# Patient Record
Sex: Male | Born: 1940 | Race: Black or African American | Hispanic: No | Marital: Married | Smoking: Former smoker
Health system: Southern US, Community
[De-identification: ages and names within clinical notes are randomized; demographics above are authoritative.]

## PROBLEM LIST (undated history)

## (undated) DIAGNOSIS — E7800 Pure hypercholesterolemia, unspecified: Secondary | ICD-10-CM

## (undated) DIAGNOSIS — Z87891 Personal history of nicotine dependence: Secondary | ICD-10-CM

## (undated) DIAGNOSIS — E119 Type 2 diabetes mellitus without complications: Secondary | ICD-10-CM

## (undated) DIAGNOSIS — H9190 Unspecified hearing loss, unspecified ear: Secondary | ICD-10-CM

## (undated) DIAGNOSIS — H9193 Unspecified hearing loss, bilateral: Secondary | ICD-10-CM

## (undated) DIAGNOSIS — R569 Unspecified convulsions: Secondary | ICD-10-CM

## (undated) DIAGNOSIS — N189 Chronic kidney disease, unspecified: Secondary | ICD-10-CM

## (undated) HISTORY — DX: Unspecified convulsions: R56.9

## (undated) HISTORY — PX: SPINE SURGERY: SHX786

---

## 1998-07-25 ENCOUNTER — Other Ambulatory Visit: Admission: RE | Admit: 1998-07-25 | Discharge: 1998-07-25 | Payer: Self-pay | Admitting: *Deleted

## 2003-08-16 ENCOUNTER — Inpatient Hospital Stay (HOSPITAL_COMMUNITY): Admission: RE | Admit: 2003-08-16 | Discharge: 2003-08-17 | Payer: Self-pay | Admitting: Neurosurgery

## 2011-11-30 ENCOUNTER — Other Ambulatory Visit: Payer: Self-pay

## 2011-11-30 DIAGNOSIS — I70219 Atherosclerosis of native arteries of extremities with intermittent claudication, unspecified extremity: Secondary | ICD-10-CM

## 2011-12-21 ENCOUNTER — Encounter: Payer: Self-pay | Admitting: Surgery

## 2012-01-04 ENCOUNTER — Encounter (HOSPITAL_COMMUNITY): Payer: Self-pay | Admitting: Family Medicine

## 2012-01-04 ENCOUNTER — Inpatient Hospital Stay (HOSPITAL_COMMUNITY): Payer: Medicare Other

## 2012-01-04 ENCOUNTER — Inpatient Hospital Stay (HOSPITAL_COMMUNITY)
Admission: AD | Admit: 2012-01-04 | Discharge: 2012-01-21 | DRG: 682 | Disposition: A | Discharge status: Skilled Nursing Facility | Payer: Medicare Other | Source: Other Acute Inpatient Hospital | Attending: Internal Medicine | Admitting: Internal Medicine

## 2012-01-04 DIAGNOSIS — I2692 Saddle embolus of pulmonary artery without acute cor pulmonale: Secondary | ICD-10-CM | POA: Diagnosis present

## 2012-01-04 DIAGNOSIS — E43 Unspecified severe protein-calorie malnutrition: Secondary | ICD-10-CM | POA: Diagnosis present

## 2012-01-04 DIAGNOSIS — K7200 Acute and subacute hepatic failure without coma: Secondary | ICD-10-CM | POA: Diagnosis present

## 2012-01-04 DIAGNOSIS — Z9282 Status post administration of tPA (rtPA) in a different facility within the last 24 hours prior to admission to current facility: Secondary | ICD-10-CM

## 2012-01-04 DIAGNOSIS — K5900 Constipation, unspecified: Secondary | ICD-10-CM | POA: Diagnosis not present

## 2012-01-04 DIAGNOSIS — D649 Anemia, unspecified: Secondary | ICD-10-CM | POA: Diagnosis present

## 2012-01-04 DIAGNOSIS — Y9210 Unspecified residential institution as the place of occurrence of the external cause: Secondary | ICD-10-CM | POA: Diagnosis not present

## 2012-01-04 DIAGNOSIS — G931 Anoxic brain damage, not elsewhere classified: Secondary | ICD-10-CM | POA: Diagnosis present

## 2012-01-04 DIAGNOSIS — I469 Cardiac arrest, cause unspecified: Secondary | ICD-10-CM | POA: Diagnosis present

## 2012-01-04 DIAGNOSIS — I129 Hypertensive chronic kidney disease with stage 1 through stage 4 chronic kidney disease, or unspecified chronic kidney disease: Secondary | ICD-10-CM | POA: Diagnosis present

## 2012-01-04 DIAGNOSIS — G912 (Idiopathic) normal pressure hydrocephalus: Secondary | ICD-10-CM | POA: Clinically undetermined

## 2012-01-04 DIAGNOSIS — I2699 Other pulmonary embolism without acute cor pulmonale: Secondary | ICD-10-CM | POA: Diagnosis present

## 2012-01-04 DIAGNOSIS — E119 Type 2 diabetes mellitus without complications: Secondary | ICD-10-CM

## 2012-01-04 DIAGNOSIS — N179 Acute kidney failure, unspecified: Secondary | ICD-10-CM | POA: Diagnosis present

## 2012-01-04 DIAGNOSIS — R339 Retention of urine, unspecified: Secondary | ICD-10-CM

## 2012-01-04 DIAGNOSIS — I498 Other specified cardiac arrhythmias: Secondary | ICD-10-CM | POA: Diagnosis not present

## 2012-01-04 DIAGNOSIS — N170 Acute kidney failure with tubular necrosis: Principal | ICD-10-CM | POA: Diagnosis present

## 2012-01-04 DIAGNOSIS — E1169 Type 2 diabetes mellitus with other specified complication: Secondary | ICD-10-CM | POA: Diagnosis not present

## 2012-01-04 DIAGNOSIS — N183 Chronic kidney disease, stage 3 unspecified: Secondary | ICD-10-CM | POA: Diagnosis present

## 2012-01-04 DIAGNOSIS — T426X5A Adverse effect of other antiepileptic and sedative-hypnotic drugs, initial encounter: Secondary | ICD-10-CM | POA: Diagnosis not present

## 2012-01-04 DIAGNOSIS — E785 Hyperlipidemia, unspecified: Secondary | ICD-10-CM | POA: Diagnosis present

## 2012-01-04 DIAGNOSIS — I82409 Acute embolism and thrombosis of unspecified deep veins of unspecified lower extremity: Secondary | ICD-10-CM | POA: Diagnosis present

## 2012-01-04 DIAGNOSIS — G9340 Encephalopathy, unspecified: Secondary | ICD-10-CM | POA: Diagnosis present

## 2012-01-04 DIAGNOSIS — Z87891 Personal history of nicotine dependence: Secondary | ICD-10-CM

## 2012-01-04 DIAGNOSIS — I2782 Chronic pulmonary embolism: Secondary | ICD-10-CM

## 2012-01-04 DIAGNOSIS — H9190 Unspecified hearing loss, unspecified ear: Secondary | ICD-10-CM | POA: Diagnosis present

## 2012-01-04 DIAGNOSIS — E870 Hyperosmolality and hypernatremia: Secondary | ICD-10-CM | POA: Diagnosis not present

## 2012-01-04 DIAGNOSIS — I10 Essential (primary) hypertension: Secondary | ICD-10-CM

## 2012-01-04 DIAGNOSIS — J9600 Acute respiratory failure, unspecified whether with hypoxia or hypercapnia: Secondary | ICD-10-CM | POA: Diagnosis present

## 2012-01-04 HISTORY — DX: Personal history of nicotine dependence: Z87.891

## 2012-01-04 HISTORY — DX: Pure hypercholesterolemia, unspecified: E78.00

## 2012-01-04 HISTORY — DX: Type 2 diabetes mellitus without complications: E11.9

## 2012-01-04 HISTORY — DX: Unspecified hearing loss, bilateral: H91.93

## 2012-01-04 HISTORY — DX: Unspecified hearing loss, unspecified ear: H91.90

## 2012-01-04 HISTORY — DX: Chronic kidney disease, unspecified: N18.9

## 2012-01-04 LAB — CREATININE, URINE, RANDOM: Creatinine, Urine: 38.98 mg/dL

## 2012-01-04 LAB — URINALYSIS, ROUTINE W REFLEX MICROSCOPIC
Bilirubin Urine: NEGATIVE
Glucose, UA: 100 mg/dL — AB
Ketones, ur: NEGATIVE mg/dL
Nitrite: NEGATIVE
Protein, ur: 30 mg/dL — AB
Specific Gravity, Urine: 1.01 (ref 1.005–1.030)
Urobilinogen, UA: 1 mg/dL (ref 0.0–1.0)
pH: 7 (ref 5.0–8.0)

## 2012-01-04 LAB — COMPREHENSIVE METABOLIC PANEL
Alkaline Phosphatase: 135 U/L — ABNORMAL HIGH (ref 39–117)
BUN: 68 mg/dL — ABNORMAL HIGH (ref 6–23)
CO2: 19 mEq/L (ref 19–32)
Chloride: 104 mEq/L (ref 96–112)
Creatinine, Ser: 7.29 mg/dL — ABNORMAL HIGH (ref 0.50–1.35)
GFR calc Af Amer: 8 mL/min — ABNORMAL LOW (ref >90)
GFR calc non Af Amer: 7 mL/min — ABNORMAL LOW (ref >90)
Glucose, Bld: 126 mg/dL — ABNORMAL HIGH (ref 70–99)
Potassium: 4.3 mEq/L (ref 3.5–5.1)
Total Bilirubin: 1.1 mg/dL (ref 0.3–1.2)

## 2012-01-04 LAB — POCT I-STAT 3, ART BLOOD GAS (G3+)
Acid-base deficit: 5 mmol/L — ABNORMAL HIGH (ref 0.0–2.0)
O2 Saturation: 87 %
TCO2: 21 mmol/L (ref 0–100)
pCO2 arterial: 30.9 mmHg — ABNORMAL LOW (ref 35.0–45.0)

## 2012-01-04 LAB — PROTIME-INR
INR: 2.13 — ABNORMAL HIGH (ref 0.00–1.49)
Prothrombin Time: 22.9 seconds — ABNORMAL HIGH (ref 11.6–15.2)

## 2012-01-04 LAB — CBC
MCH: 26.5 pg (ref 26.0–34.0)
MCHC: 33 g/dL (ref 30.0–36.0)
RDW: 17.4 % — ABNORMAL HIGH (ref 11.5–15.5)

## 2012-01-04 LAB — MAGNESIUM: Magnesium: 2 mg/dL (ref 1.5–2.5)

## 2012-01-04 LAB — URINE MICROSCOPIC-ADD ON

## 2012-01-04 LAB — PHOSPHORUS: Phosphorus: 5.7 mg/dL — ABNORMAL HIGH (ref 2.3–4.6)

## 2012-01-04 LAB — MRSA PCR SCREENING: MRSA by PCR: NEGATIVE

## 2012-01-04 LAB — PROCALCITONIN: Procalcitonin: 4.8 ng/mL

## 2012-01-04 MED ORDER — PRISMASOL BGK 4/2.5 32-4-2.5 MEQ/L IV SOLN
INTRAVENOUS | Status: DC
  Administered 2012-01-04 – 2012-01-07 (×7): via INTRAVENOUS_CENTRAL
  Filled 2012-01-04 (×7): qty 5000

## 2012-01-04 MED ORDER — SODIUM CHLORIDE 0.9 % IV SOLN
250.0000 mL | INTRAVENOUS | Status: DC | PRN
  Administered 2012-01-06 – 2012-01-14 (×3): 250 mL via INTRAVENOUS

## 2012-01-04 MED ORDER — SODIUM CHLORIDE 0.9 % IJ SOLN: 3.0000 mL | Freq: Two times a day (BID) | INTRAMUSCULAR | Status: DC

## 2012-01-04 MED ORDER — PRISMASOL BGK 4/2.5 32-4-2.5 MEQ/L IV SOLN
INTRAVENOUS | Status: DC
  Administered 2012-01-04 – 2012-01-08 (×8): via INTRAVENOUS_CENTRAL
  Filled 2012-01-04 (×9): qty 5000

## 2012-01-04 MED ORDER — DEXTROSE 5 % IV SOLN: 2.0000 g | Freq: Once | INTRAVENOUS | Status: DC

## 2012-01-04 MED ORDER — SODIUM CHLORIDE 0.9 % IV SOLN
25.0000 ug/h | INTRAVENOUS | Status: DC
  Administered 2012-01-04: 50 ug/h via INTRAVENOUS
  Filled 2012-01-04: qty 50

## 2012-01-04 MED ORDER — FENTANYL BOLUS VIA INFUSION
25.0000 ug | Freq: Four times a day (QID) | INTRAVENOUS | Status: DC | PRN
  Filled 2012-01-04: qty 100

## 2012-01-04 MED ORDER — PRISMASOL BGK 4/2.5 32-4-2.5 MEQ/L IV SOLN
INTRAVENOUS | Status: DC
  Administered 2012-01-04 – 2012-01-08 (×22): via INTRAVENOUS_CENTRAL
  Filled 2012-01-04 (×32): qty 5000

## 2012-01-04 MED ORDER — CHLORHEXIDINE GLUCONATE 0.12 % MT SOLN
15.0000 mL | Freq: Two times a day (BID) | OROMUCOSAL | Status: DC
  Administered 2012-01-04 – 2012-01-21 (×28): 15 mL via OROMUCOSAL
  Filled 2012-01-04 (×37): qty 15

## 2012-01-04 MED ORDER — SODIUM CHLORIDE 0.9 % IV SOLN
250.0000 mL | INTRAVENOUS | Status: DC | PRN
  Administered 2012-01-07: 250 mL via INTRAVENOUS

## 2012-01-04 MED ORDER — HEPARIN SODIUM (PORCINE) 1000 UNIT/ML IJ SOLN
INTRAMUSCULAR | Status: AC
  Administered 2012-01-04: 2400 [IU]
  Filled 2012-01-04: qty 3

## 2012-01-04 MED ORDER — HEPARIN (PORCINE) 2000 UNITS/L FOR CRRT
INTRAVENOUS_CENTRAL | Status: DC | PRN
  Filled 2012-01-04: qty 1000

## 2012-01-04 MED ORDER — PROPOFOL 10 MG/ML IV EMUL
5.0000 ug/kg/min | INTRAVENOUS | Status: DC
  Administered 2012-01-04 – 2012-01-05 (×2): 30 ug/kg/min via INTRAVENOUS
  Administered 2012-01-05 – 2012-01-06 (×2): 45 ug/kg/min via INTRAVENOUS
  Filled 2012-01-04 (×6): qty 100

## 2012-01-04 MED ORDER — HEPARIN SODIUM (PORCINE) 1000 UNIT/ML DIALYSIS
1000.0000 [IU] | INTRAMUSCULAR | Status: DC | PRN
  Administered 2012-01-08: 2.4 [IU] via INTRAVENOUS_CENTRAL
  Filled 2012-01-04: qty 3
  Filled 2012-01-04: qty 6

## 2012-01-04 MED ORDER — OSMOLITE 1.2 CAL PO LIQD
1000.0000 mL | ORAL | Status: DC
  Administered 2012-01-05: 1000 mL
  Filled 2012-01-04 (×3): qty 1000

## 2012-01-04 MED ORDER — SODIUM CHLORIDE 0.9 % IJ SOLN: 3.0000 mL | INTRAMUSCULAR | Status: DC | PRN

## 2012-01-04 MED ORDER — INSULIN ASPART 100 UNIT/ML ~~LOC~~ SOLN
2.0000 [IU] | SUBCUTANEOUS | Status: DC
  Administered 2012-01-05: 2 [IU] via SUBCUTANEOUS
  Administered 2012-01-05 – 2012-01-06 (×2): 4 [IU] via SUBCUTANEOUS
  Administered 2012-01-06 (×4): 2 [IU] via SUBCUTANEOUS
  Administered 2012-01-07 (×2): 4 [IU] via SUBCUTANEOUS
  Administered 2012-01-07 – 2012-01-09 (×3): 2 [IU] via SUBCUTANEOUS
  Administered 2012-01-10 (×2): 4 [IU] via SUBCUTANEOUS
  Administered 2012-01-10 (×2): 2 [IU] via SUBCUTANEOUS
  Administered 2012-01-11: 4 [IU] via SUBCUTANEOUS
  Administered 2012-01-11: 2 [IU] via SUBCUTANEOUS
  Administered 2012-01-11: 6 [IU] via SUBCUTANEOUS
  Administered 2012-01-11: 2 [IU] via SUBCUTANEOUS

## 2012-01-04 MED ORDER — BIOTENE DRY MOUTH MT LIQD
15.0000 mL | Freq: Four times a day (QID) | OROMUCOSAL | Status: DC
  Administered 2012-01-04 – 2012-01-21 (×54): 15 mL via OROMUCOSAL

## 2012-01-04 MED ORDER — PANTOPRAZOLE SODIUM 40 MG IV SOLR
40.0000 mg | INTRAVENOUS | Status: DC
  Administered 2012-01-04 – 2012-01-05 (×2): 40 mg via INTRAVENOUS
  Filled 2012-01-04 (×4): qty 40

## 2012-01-04 NOTE — Progress Notes (Signed)
ANTICOAGULATION CONSULT NOTE - Initial Consult  Pharmacy Consult for heparin Indication: pulmonary embolus  Allergies  Allergen Reactions  . Metformin And Related     Patient Measurements: Height: 5\' 10"  (177.8 cm) Weight: 250 lb 14.1 oz (113.8 kg) IBW/kg (Calculated) : 73  Heparin Dosing Weight: 98kg Vital Signs: Temp: 97.4 F (36.3 C) (11/18 1500) Temp src: Oral (11/18 1500) BP: 143/94 mmHg (11/18 1845) Pulse Rate: 62  (11/18 1815)  Labs:  Basename 01/04/12 1830 01/04/12 1528  HGB -- 10.4*  HCT -- 31.5*  PLT -- 171  APTT -- --  LABPROT 22.9* --  INR 2.13* --  HEPARINUNFRC -- --  CREATININE -- 7.29*  CKTOTAL -- --  CKMB -- --  TROPONINI -- --    Estimated Creatinine Clearance: 11.9 ml/min (by C-G formula based on Cr of 7.29).   Medical History: Past Medical History  Diagnosis Date  . Diabetes mellitus, type 2   . Hypercholesterolemia   . Former smoker   . Bilateral hearing loss     Wears hearing aids    Assessment: 71 yo male here from [place] with PE. Patient noted s/p cardioac arrest on 11/13 and TPA 100mg  IV. Patient has been receiving lovenox and coumadin with INR=2.13. SCr=7.29 and patient to begin CRRT. Last dose of lovenox is not clear but Xa is 0.13 today (where a level of 1-2 Xa is recommended therapeutic dosing). Patient to begin heparin when INR < 2.0.  Goal of Therapy:  Heparin level 0.3-0.7 units/ml Monitor platelets by anticoagulation protocol: Yes   Plan:  -Hold heparin for now -Daily PT/INR  Harland German, Pharm D 01/04/2012 7:32 PM

## 2012-01-04 NOTE — H&P (Addendum)
Name: Noah Torres MRN: [id number] DOB: [birth year]    LOS: 0  PCCM ADMISSION NOTE  History of Present Illness: 71 year old M with PMH of CKD (baseline Cr 1.9) and DM who presented to [facility] on 11/11 with dyspnea. He was found to have a lower extremity DVT and saddle PE with RV strain. Subsequently, he developed ARF, acidosis and shock. He was intubated on 11/13 and shortly thereafter had a cardiac arrest. He was given TPA and resuscitated. He was transferred to [facility] for evaluation and treatment of acute renal failure.   Lines / Drains: Rt HD Cath 11/18>>>  Cultures: Sputum 11/14>>> GNR (per outside) note  Antibiotics: Ceftriaxone 11/16>>>11/18  Tests / Events: 11/11 - Saddle PE noted on CT-A, started anticoagulation 11/12 - Echo shows PA pressure 58-60; Doppler shows LLE DVT 11/13 - Develops shock started on pressors, develops resp failure intubated, cardiac arrest - given TPA and resuscitated 11/14 - AKI (Creat increa 1.5-->2.7) 11/16 - GRN in sputum, concern for BLL PNA, start on rocephin 11/17 - stopped all pressors 11/18 - transferred to [facility]    The patient is sedated, intubated and unable to provide history, which was obtained for available medical records.    Past Medical History  Diagnosis Date  . Diabetes mellitus, type 2   . Hypercholesterolemia   . Former smoker   . Bilateral hearing loss     Wears hearing aids   Past Surgical History  Procedure Date  . Spine surgery    Prior Medications: Glimepiride 2 mg PO daily  Allergies Allergies  Allergen Reactions  . Metformin And Related     Family History Family History  Problem Relation Age of Onset  . Diabetes Father   . Coronary artery disease Brother     Social History  reports that he has quit smoking. He does not have any smokeless tobacco history on file. He reports that he does not drink alcohol or use illicit drugs.  Review Of Systems: Not completed secondary to patient's  AMS.  Vital Signs: Pulse Rate:  [102] 102  (11/18 1500) Resp:  [30] 30  (11/18 1500) BP: (169)/(108) 169/108 mmHg (11/18 1500) SpO2:  [92 %] 92 % (11/18 1500) FiO2 (%):  [50 %] 50 % (11/18 1500) Weight:  [173 lb (78.472 kg)] 173 lb (78.472 kg) (11/18 1500)    Physical Examination: General:  Intubated, obtunded, ill appearing Neuro:  Sedated, opens eyes to commands but does not follow other commands; withdraws to painful stimulus HEENT:  PERRLA, OETT in place,  Neck:  No lines in place   Cardiovascular:  RRR, no M,R,G Lungs:  Course breath sounds bilaterally, scattered wheezes, no rales Abdomen:  Anasarca present, NDNT, hypoactive bowel sounds  Musculoskeletal:  3+ edema throughout extremities and trunk Skin: intact, no rashes or ecchymosis   Ventilator settings: Vent Mode:  [-] PRVC FiO2 (%):  [50 %] 50 % Set Rate:  [12 bmp] 12 bmp Vt Set:  [580 mL] 580 mL PEEP:  [5 cmH20] 5 cmH20 Plateau Pressure:  [21 cmH20] 21 cmH20  Labs and Imaging:  Reviewed.  Please refer to the Assessment and Plan section for relevant results.  ASSESSMENT AND PLAN  PULMONARY No results found for this basename: PHART:5,PCO2:5,PCO2ART:5,PO2ART:5,HCO3:5,O2SAT:5 in the last 168 hours A:  Acute Respiratory Failure 2/2 saddle PE     ? Aspiration  Pneumonia per outside hospital      Pulm Edema  P: Continue on Vent PRVC @ RR 15, PEEP 5 Renal to  see regarding fluid removal F/u CXR and ABG  CARDIOVASCULAR No results found for this basename: TROPONINI:5,LATICACIDVEN:5, O2SATVEN:5,PROBNP:5 in the last 168 hours A:  Resolved Shock, s/p pressors      HTN P: - trend BP  RENAL No results found for this basename: NA:5,K:2,CL:5,CO2:5,BUN:5,CREATININE:5,CALCIUM:5,MG:5,PHOS:5 in the last 168 hours A:  AKI, contrast induced followed by ATN; max  P: - Needs dialysis, renal team to follow and make recs - F/u lytes  GASTROINTESTINAL No results found for this basename:  AST:5,ALT:5,ALKPHOS:5,BILITOT:5,PROT:5,ALBUMIN:5 in the last 168 hours A:  NPO P: - Consider tube feeds since no evidence of feeds in since 11/11   HEMATOLOGIC No results found for this basename: HGB:5,HCT:5,PLT:5,INR:5,APTT:5 in the last 168 hours A:  LLE DVT with subsequent PE P: - INR 2.4, needs to start heparin for anticoagulation, hold coumadin in case more procedures required, start heprain once INR < 2  INFECTIOUS No results found for this basename: WBC:5,PROCALCITON:5 in the last 168 hours A: Reported GNR on sputum at OSH, s/p Rocephin x 3 days P: - Restart antibiotics if develops fever or new infx - hold for now since no fever, low WC & no clear infx  ENDOCRINE No results found for this basename: GLUCAP:5 in the last 168 hours A:  DM P: - Phase 1 ICU Hyperglycemia protocol  NEUROLOGIC A:  Sedation, possible anoxic injury P: - propofol and fentanyl  -Needs serial exams of propofol to assess extent of encephalopathy  Best practices / Disposition: -->ICU status under PCCM -->full code -->Heparin PE treatment -->No GI PPX -->ventilator bundle -->diet - NPO -->sister updated ont he phone 11/18 & consent obtained for HD catheter  Si Raider. Clinton Sawyer, MD, MBA 01/04/2012, 3:59 PM Family Medicine Resident, PGY-2 [phone] pager   Care during the described time interval was provided by me and/or other providers on the critical care team.  I have reviewed this patient's available data, including medical history, events of note, physical examination and test results as part of my evaluation  CC time x  60 mins  Goku Harb V.

## 2012-01-04 NOTE — Procedures (Signed)
Central Venous Catheter Insertion Procedure Note Noah Torres [id number] 05-07-1940  Procedure: Insertion of Central Venous Catheter Indications: hemodialysis   Procedure Details Consent: Risks of procedure as well as the alternatives and risks of each were explained to the (patient/caregiver).  Consent for procedure obtained. Time Out: Verified patient identification, verified procedure, site/side was marked, verified correct patient position, special equipment/implants available, medications/allergies/relevent history reviewed, required imaging and test results available.  Performed Real time [place] used to ID and cannulate the right IJ  Maximum sterile technique was used including antiseptics, cap, gloves, gown, hand hygiene, mask and sheet. Skin prep: Chlorhexidine; local anesthetic administered A antimicrobial bonded/coated triple lumen catheter was placed in the right internal jugular vein using the Seldinger technique.  Evaluation Blood flow good Complications: No apparent complications Patient did tolerate procedure well. Chest X-ray ordered to verify placement.  CXR: pending.  BABCOCK,PETE 01/04/2012, 4:02 PM  Ultrasound used for site verification, live visualisation of needle entry & guidewire prior to dilation  Noah Torres V.

## 2012-01-04 NOTE — Consult Note (Signed)
Reason for Consult: Acute renal failure on CKD3 with volume overload Referring Physician: [name]- PCCM  Noah Torres is an 71 y.o. male.  HPI:  71 year old African American man with past medical history significant for type 2 diabetes mellitus, dyslipidemia and prior tobacco use history. Present at the [facility] on 12/28/2011 with progressive worsening shortness of breath specifically upon exertion. After initial evaluation, found to have a large saddle pulmonary embolus by CT angiography. Creatinine on admission at [facility] was 1.6, improved to 1.5 about 2 days after on 12/30/2011. Unfortunately, the patient had the cardiorespiratory arrest event on 12/30/2011 followed by abrupt elevation of creatinine that has continued to rise to 6.8 today. Also noted is progressively diminished urine output and the patient is reported to be about 20 L positive fluid balance. He was transferred to [facility] for renal replacement therapy given exhaustion of medical options. It appears that he may have a history of chronic kidney disease given his baseline creatinine on admission. Medication review from [facility] is not significant for any nephrotoxic exposure and appears to be limited only to intravenous contrast that was used for angiography. .  Past Medical History  Diagnosis Date  . Diabetes mellitus, type 2   . Hypercholesterolemia   . Former smoker   . Bilateral hearing loss     Wears hearing aids    Past Surgical History  Procedure Date  . Spine surgery     Family History  Problem Relation Age of Onset  . Diabetes Father   . Coronary artery disease Brother     Social History:  reports that he has quit smoking. He does not have any smokeless tobacco history on file. He reports that he does not drink alcohol or use illicit drugs.  Allergies:  Allergies  Allergen Reactions  . Metformin And Related     Medications:  Scheduled:    .  antiseptic oral rinse  15 mL Mouth Rinse QID  . chlorhexidine  15 mL Mouth Rinse BID  . heparin      . sodium chloride  3 mL Intravenous Q12H    Results for orders placed during the hospital encounter of 01/04/12 (from the past 48 hour(s))  MRSA PCR SCREENING     Status: Normal   Collection Time   01/04/12  3:06 PM      Component Value Range Comment   MRSA by PCR NEGATIVE  NEGATIVE   URINALYSIS, ROUTINE W REFLEX MICROSCOPIC     Status: Abnormal   Collection Time   01/04/12  3:06 PM      Component Value Range Comment   Color, Urine YELLOW  YELLOW    APPearance CLOUDY (*) CLEAR    Specific Gravity, Urine 1.010  1.005 - 1.030    pH 7.0  5.0 - 8.0    Glucose, UA 100 (*) NEGATIVE mg/dL    Hgb urine dipstick LARGE (*) NEGATIVE    Bilirubin Urine NEGATIVE  NEGATIVE    Ketones, ur NEGATIVE  NEGATIVE mg/dL    Protein, ur 30 (*) NEGATIVE mg/dL    Urobilinogen, UA 1.0  0.0 - 1.0 mg/dL    Nitrite NEGATIVE  NEGATIVE    Leukocytes, UA SMALL (*) NEGATIVE   URINE MICROSCOPIC-ADD ON     Status: Normal   Collection Time   01/04/12  3:06 PM      Component Value Range Comment   WBC, UA 0-2  <3 WBC/hpf    RBC / HPF 21-50  <  3 RBC/hpf    Bacteria, UA RARE  RARE   COMPREHENSIVE METABOLIC PANEL     Status: Abnormal   Collection Time   01/04/12  3:28 PM      Component Value Range Comment   Sodium 140  135 - 145 mEq/L    Potassium 4.3  3.5 - 5.1 mEq/L    Chloride 104  96 - 112 mEq/L    CO2 19  19 - 32 mEq/L    Glucose, Bld 126 (*) 70 - 99 mg/dL    BUN 68 (*) 6 - 23 mg/dL    Creatinine, Ser 1.61 (*) 0.50 - 1.35 mg/dL    Calcium 6.8 (*) 8.4 - 10.5 mg/dL    Total Protein 5.7 (*) 6.0 - 8.3 g/dL    Albumin 2.0 (*) 3.5 - 5.2 g/dL    AST 096 (*) 0 - 37 U/L    ALT 424 (*) 0 - 53 U/L    Alkaline Phosphatase 135 (*) 39 - 117 U/L    Total Bilirubin 1.1  0.3 - 1.2 mg/dL    GFR calc non Af Amer 7 (*) >90 mL/min    GFR calc Af Amer 8 (*) >90 mL/min   MAGNESIUM     Status: Normal   Collection Time    01/04/12  3:28 PM      Component Value Range Comment   Magnesium 2.0  1.5 - 2.5 mg/dL   PHOSPHORUS     Status: Abnormal   Collection Time   01/04/12  3:28 PM      Component Value Range Comment   Phosphorus 5.7 (*) 2.3 - 4.6 mg/dL   CBC     Status: Abnormal (Preliminary result)   Collection Time   01/04/12  3:28 PM      Component Value Range Comment   WBC PENDING  4.0 - 10.5 K/uL    RBC 3.92 (*) 4.22 - 5.81 MIL/uL    Hemoglobin 10.4 (*) 13.0 - 17.0 g/dL    HCT 04.5 (*) 40.9 - 52.0 %    MCV 80.4  78.0 - 100.0 fL    MCH 26.5  26.0 - 34.0 pg    MCHC 33.0  30.0 - 36.0 g/dL    RDW 81.1 (*) 91.4 - 15.5 %    Platelets 171  150 - 400 K/uL   POCT I-STAT 3, BLOOD GAS (G3+)     Status: Abnormal   Collection Time   01/04/12  4:21 PM      Component Value Range Comment   pH, Arterial 7.406  7.350 - 7.450    pCO2 arterial 30.9 (*) 35.0 - 45.0 mmHg    pO2, Arterial 50.0 (*) 80.0 - 100.0 mmHg    Bicarbonate 19.6 (*) 20.0 - 24.0 mEq/L    TCO2 21  0 - 100 mmol/L    O2 Saturation 87.0      Acid-base deficit 5.0 (*) 0.0 - 2.0 mmol/L    Patient temperature 97.4 F      Collection site RADIAL, ALLEN'S TEST ACCEPTABLE      Drawn by Operator      Sample type ARTERIAL       Dg Chest Port 1 View  01/04/2012  *RADIOLOGY REPORT*  Clinical Data: 71 year old male status post placement of catheter for dialysis.  PORTABLE CHEST - 1 VIEW  Comparison: None.  Findings: Semi upright AP portable view at 1604 hours. Endotracheal tube tip between level of clavicles and carina. Enteric tube courses to the abdomen,  tip not included.  Left IJ approach single lumen catheter, tip terminates to the right of the midline, probably at the innominate vein confluence.  Right IJ approach dual lumen dialysis type catheter, tip just below the level of the carina.   Cardiac size and mediastinal contours are within normal limits. Dense retrocardiac opacity.  Probable small effusions.  Pulmonary vascular congestion without overt  edema.  No pneumothorax identified.  IMPRESSION: 1.  Right IJ dual lumen catheter placed, tip at the SVC level. 2.  Other lines and tubes placed as above. 3.  No pneumothorax. Small bilateral pleural effusions.  Vascular congestion.  Bilateral lower lobe collapse or consolidation.   Original Report Authenticated By: Erskine Speed, M.D.     Review of Systems  Unable to perform ROS: intubated   Blood pressure 169/108, pulse 102, temperature 97.4 F (36.3 C), temperature source Oral, resp. rate 30, height 5\' 10"  (1.778 m), weight 113.8 kg (250 lb 14.1 oz), SpO2 92.00%. Physical Exam  Nursing note and vitals reviewed. Constitutional: He appears well-developed and well-nourished.       Intubated, sedated on propofol  HENT:  Head: Normocephalic and atraumatic.  Mouth/Throat: Oropharynx is clear and moist. No oropharyngeal exudate.  Eyes: Conjunctivae normal are normal. Pupils are equal, round, and reactive to light.  Neck: Normal range of motion. Neck supple. No JVD present. No tracheal deviation present. No thyromegaly present.  Cardiovascular: Normal rate, regular rhythm and normal heart sounds.  Exam reveals no gallop and no friction rub.        Loud S2 audible  Respiratory: Effort normal and breath sounds normal. No stridor. No respiratory distress. He has no wheezes. He has no rales. He exhibits no tenderness.  GI: Soft. Bowel sounds are normal. He exhibits no distension and no mass. There is no tenderness. There is no rebound and no guarding.  Musculoskeletal: Normal range of motion. He exhibits edema.       2+ non-pitting edema  Lymphadenopathy:    He has no cervical adenopathy.  Neurological:       sedated  Skin: Skin is warm and dry. Rash noted. No erythema. No pallor.       Truncal non-blanchable erythematous macular rash noted    Assessment/Plan: 1. Acute renal failure: Likely on chronic kidney disease stage III-history and timeline of events appear to favor ischemic ATN with  cardiorespiratory arrest/renal hypoperfusion injury. Unfortunately given the extent of injury, he is anuric and significantly volume overloaded. From labs available from [facility], it appears that his potassium level was as high as 6.2 about 3 days ago and was medically managed. Abdominal ultrasound that was done at [facility] appears to have been suboptimal in imaging the kidneys and did not reveal any hydronephrosis. Urinary sediment and urinary electrolytes will be ordered today. There is no acute indications for hemodialysis but given anuric urine output and significant volume overload that seems to be compromising his respiratory status, will initiate continuous renal replacement therapy for minimization of further hemodynamic injury to the kidney while attempting ultrafiltration. Suspect renal recovery will ensue with stabilization of his cardiorespiratory status. 2. Saddle pulmonary embolus: hemodynamically stable and plans for anticoagulation when deemed appropriate by CCM 3. Elevated LFTs: Likely shock liver- monitor with improving BP 4. VDRF s/p cardiorespiratory arrest: Remains intubated and on ventilator support, we'll attempt ultrafiltration to assist with weaning.  Khianna Blazina K. 01/04/2012, 4:57 PM

## 2012-01-04 NOTE — Progress Notes (Signed)
eLink Physician-Brief Progress Note Patient Name: Noah Torres Treat DOB: 06/04/1940 MRN: [id number]  Date of Service  01/04/2012   HPI/Events of Note  SUP   eICU Interventions  Protonix ordered        [facility] 01/04/2012, 6:19 PM

## 2012-01-05 ENCOUNTER — Encounter (HOSPITAL_COMMUNITY): Payer: Self-pay

## 2012-01-05 LAB — GLUCOSE, CAPILLARY
Glucose-Capillary: 100 mg/dL — ABNORMAL HIGH (ref 70–99)
Glucose-Capillary: 133 mg/dL — ABNORMAL HIGH (ref 70–99)
Glucose-Capillary: 140 mg/dL — ABNORMAL HIGH (ref 70–99)
Glucose-Capillary: 178 mg/dL — ABNORMAL HIGH (ref 70–99)
Glucose-Capillary: 97 mg/dL (ref 70–99)

## 2012-01-05 LAB — RENAL FUNCTION PANEL
Albumin: 2.1 g/dL — ABNORMAL LOW (ref 3.5–5.2)
BUN: 40 mg/dL — ABNORMAL HIGH (ref 6–23)
CO2: 24 mEq/L (ref 19–32)
CO2: 24 mEq/L (ref 19–32)
Calcium: 7.1 mg/dL — ABNORMAL LOW (ref 8.4–10.5)
Chloride: 104 mEq/L (ref 96–112)
Chloride: 101 mEq/L (ref 96–112)
Creatinine, Ser: 5.38 mg/dL — ABNORMAL HIGH (ref 0.50–1.35)
Creatinine, Ser: 4.43 mg/dL — ABNORMAL HIGH (ref 0.50–1.35)
GFR calc non Af Amer: 10 mL/min — ABNORMAL LOW (ref >90)
GFR calc non Af Amer: 12 mL/min — ABNORMAL LOW (ref >90)
Glucose, Bld: 79 mg/dL (ref 70–99)
Potassium: 4.2 mEq/L (ref 3.5–5.1)

## 2012-01-05 LAB — CBC
Hemoglobin: 10.2 g/dL — ABNORMAL LOW (ref 13.0–17.0)
MCH: 26 pg (ref 26.0–34.0)
MCHC: 32.2 g/dL (ref 30.0–36.0)
Platelets: 142 10*3/uL — ABNORMAL LOW (ref 150–400)
RDW: 17.7 % — ABNORMAL HIGH (ref 11.5–15.5)

## 2012-01-05 LAB — MAGNESIUM: Magnesium: 2.3 mg/dL (ref 1.5–2.5)

## 2012-01-05 MED ORDER — PRO-STAT SUGAR FREE PO LIQD
60.0000 mL | Freq: Three times a day (TID) | ORAL | Status: DC
  Administered 2012-01-05 – 2012-01-07 (×6): 60 mL
  Filled 2012-01-05 (×8): qty 60

## 2012-01-05 MED ORDER — OSMOLITE 1.2 CAL PO LIQD
1000.0000 mL | ORAL | Status: DC
  Administered 2012-01-06 – 2012-01-07 (×2): 1000 mL
  Filled 2012-01-05 (×3): qty 1000

## 2012-01-05 MED ORDER — HEPARIN (PORCINE) IN NACL 100-0.45 UNIT/ML-% IJ SOLN
1700.0000 [IU]/h | INTRAMUSCULAR | Status: DC
  Administered 2012-01-05: 1700 [IU]/h via INTRAVENOUS
  Filled 2012-01-05 (×2): qty 250

## 2012-01-05 NOTE — Progress Notes (Signed)
Name: Noah Torres MRN: [id number] DOB: 1940/10/23    LOS: 1  PCCM ADMISSION NOTE  History of Present Illness: 71 year old M with PMH of CKD (baseline Cr 1.9) and DM who presented to [facility] on 11/11 with dyspnea. He was found to have a lower extremity DVT and saddle PE with RV strain. Subsequently, he developed ARF, acidosis and shock. He was intubated on 11/13 and shortly thereafter had a cardiac arrest. He was given TPA and resuscitated. He was transferred to [facility] for evaluation and treatment of acute renal failure.   Lines / Drains: ETT CVL Rt HD Cath 11/18>>>  Cultures: Sputum 11/14>>> GNR (per outside) note  Antibiotics: Ceftriaxone 11/16>>>11/18  Tests / Events: 11/11 - Saddle PE noted on CT-A, started anticoagulation 11/12 - Echo shows PA pressure 58-60; Doppler shows LLE DVT 11/13 - Develops shock started on pressors, develops resp failure intubated, cardiac arrest - given TPA and resuscitated 11/14 - AKI (Creat increa 1.5-->2.7) 11/16 - GRN in sputum, concern for BLL PNA, start on rocephin 11/17 - stopped all pressors 11/18 - transferred to [facility] and started on CRRT   Overnight: no acute events   Vital Signs: Temp:  [93.1 F (33.9 C)-97.4 F (36.3 C)] 97.4 F (36.3 C) (11/19 0832) Pulse Rate:  [42-107] 107  (11/19 0900) Resp:  [12-30] 12  (11/19 0900) BP: (89-169)/(59-108) 147/87 mmHg (11/19 0900) SpO2:  [32 %-100 %] 100 % (11/19 0900) FiO2 (%):  [40 %-60 %] 40 % (11/19 0900) Weight:  [235 lb 7.2 oz (106.8 kg)-250 lb 14.1 oz (113.8 kg)] 235 lb 7.2 oz (106.8 kg) (11/19 0500) I/O last 3 completed shifts: In: 892.3 [I.V.:692.3; NG/GT:190; IV Piggyback:10] Out: 3136 [Urine:80; Other:3056]  Physical Examination: General:  Intubated, obtunded, ill appearing Neuro:  Sedated, opens eyes to commands but does not follow other commands; withdraws to painful stimulus HEENT:  PERRLA, OETT in place,  Neck:  No lines in place   Cardiovascular:  RRR, no  M,R,G Lungs:  Course breath sounds bilaterally, scattered wheezes, no rales Abdomen:  Anasarca present, NDNT, hypoactive bowel sounds  Musculoskeletal:  3+ edema throughout extremities and trunk Skin: intact, no rashes or ecchymosis   Ventilator settings: Vent Mode:  [-] CPAP FiO2 (%):  [40 %-60 %] 40 % Set Rate:  [12 bmp-15 bmp] 15 bmp Vt Set:  [580 mL] 580 mL PEEP:  [5 cmH20] 5 cmH20 Pressure Support:  [5 cmH20] 5 cmH20 Plateau Pressure:  [19 cmH20-27 cmH20] 27 cmH20  Labs and Imaging:  Reviewed.  Please refer to the Assessment and Plan section for relevant results.  ASSESSMENT AND PLAN  PULMONARY  Lab 01/04/12 1621  PHART 7.406  PCO2ART 30.9*  PO2ART 50.0*  HCO3 19.6*  O2SAT 87.0   A:  Acute Respiratory Failure 2/2 saddle PE     ? Aspiration  Pneumonia per outside hospital      Pulm Edema  P: Continue on Vent PRVC @ RR 15, PEEP 5 Actively removing fluids to aid extubation process Consider extubation 11/20 depending mental status   CARDIOVASCULAR No results found for this basename: TROPONINI:5,LATICACIDVEN:5, O2SATVEN:5,PROBNP:5 in the last 168 hours A:  Resolved Shock, s/p pressors      HTN P: - trend BP  RENAL  Lab 01/05/12 0420 01/04/12 1528  NA 138 140  K 4.3 4.3  CL 104 104  CO2 24 19  BUN 50* 68*  CREATININE 5.38* 7.29*  CALCIUM 7.1* 6.8*  MG 2.3 2.0  PHOS 4.7* 5.7*  A:  AKI, contrast induced followed by ATN P: - on CRRT per renal  GASTROINTESTINAL  Lab 01/05/12 0420 01/04/12 1528  AST -- 155*  ALT -- 424*  ALKPHOS -- 135*  BILITOT -- 1.1  PROT -- 5.7*  ALBUMIN 1.9* 2.0*   A:  Severe Protein Malnutrition  P: - Stared tube feeds 11/18   HEMATOLOGIC  Lab 01/05/12 0420 01/04/12 1830 01/04/12 1528  HGB 10.2* -- 10.4*  HCT 31.7* -- 31.5*  PLT 142* -- 171  INR 2.01* 2.13* --  APTT -- -- --   A:  LLE DVT with subsequent PE P: - INR 2.4, needs to start heparin for anticoagulation, hold coumadin in case more procedures required,  start heparin once INR < 2  INFECTIOUS  Lab 01/05/12 0420 01/04/12 1830 01/04/12 1528  WBC 3.6* -- 5.2  PROCALCITON -- 4.80 --   A: Reported GNR on sputum at OSH, s/p Rocephin x 3 days P: - Restart antibiotics if develops fever or new infx - hold for now since no fever, low WC & no clear infx  ENDOCRINE  Lab 01/05/12 0736 01/05/12 0344 01/05/12 0039 01/04/12 1941  GLUCAP 100* 81 97 109*   A:  DM P: - Phase 1 ICU Hyperglycemia protocol  NEUROLOGIC A:  Sedation, possible anoxic injury vs uremic encephalopathy P: - cont propofol and d/c fentanyl  Best practices / Disposition: -->ICU status under PCCM -->full code -->Heparin PE treatment -->No GI PPX -->ventilator bundle -->diet - NPO -->sister updated ont he phone 11/18 & consent obtained for HD catheter  Si Raider. Clinton Sawyer, MD, MBA 01/05/2012, 9:08 AM Family Medicine Resident, PGY-2 [phone] pager   I have examined the patient and reviewed the database. I have formulated the assessment and plan as reflected in the note above with amendments made by me. 35 mins of direct critical care time provided. Family members updated. Depressed cognition prohibits extubation  Billy Fischer, MD;  PCCM service; Mobile [phone]

## 2012-01-05 NOTE — Progress Notes (Signed)
ANTICOAGULATION CONSULT NOTE - Follow Up Consult  Pharmacy Consult for Heparin Indication: pulmonary embolus  Allergies  Allergen Reactions  . Metformin And Related     Patient Measurements: Height: 5\' 10"  (177.8 cm) Weight: 235 lb 7.2 oz (106.8 kg) IBW/kg (Calculated) : 73  Heparin Dosing Weight: 98 kg  Vital Signs: Temp: 98.4 F (36.9 C) (11/19 1308) Temp src: Oral (11/19 1308) BP: 124/70 mmHg (11/19 1600) Pulse Rate: 109  (11/19 1600)  Labs:  Basename 01/05/12 1600 01/05/12 0420 01/04/12 1830 01/04/12 1528  HGB -- 10.2* -- 10.4*  HCT -- 31.7* -- 31.5*  PLT -- 142* -- 171  APTT -- -- -- --  LABPROT 21.3* 22.0* 22.9* --  INR 1.93* 2.01* 2.13* --  HEPARINUNFRC -- -- -- --  CREATININE -- 5.38* -- 7.29*  CKTOTAL -- -- -- --  CKMB -- -- -- --  TROPONINI -- -- -- --    Estimated Creatinine Clearance: 15.6 ml/min (by C-G formula based on Cr of 5.38).   Medications:  Infusions:    . dialysis replacement fluid (prismasate) 400 mL/hr at 01/05/12 0842  . dialysis replacement fluid (prismasate) 300 mL/hr at 01/05/12 1301  . dialysate (PRISMASATE) 1,500 mL/hr at 01/05/12 1632  . propofol 45 mcg/kg/min (01/05/12 1400)  . [DISCONTINUED] feeding supplement (OSMOLITE 1.2 CAL) 1,000 mL (01/05/12 0750)  . [DISCONTINUED] fentaNYL infusion INTRAVENOUS Stopped (01/05/12 1000)    Assessment: 71 y/o male transferred here from [facility] on 11/18 with saddle PE. He was started on Lovenox and Coumadin on 11/11 but cardiac arrested on 11/13 and received tPA 100 mg. INR is now below 2 and pharmacy can start heparin. H/H are stable from yesterday, platelets are trending down - will watch closely.  Goal of Therapy:  Heparin level 0.3-0.7 units/ml Monitor platelets by anticoagulation protocol: Yes   Plan:  -Begin heparin drip at 1700 units/hr IV with no bolus -Heparin level 8 hours after started -Daily heparin level and CBC while on heparin  [facility], [place].D.,  BCPS Clinical Pharmacist Pager: [phone] 01/05/2012 4:43 PM

## 2012-01-05 NOTE — Progress Notes (Signed)
Patient ID: Noah Torres, male   DOB: 1940/04/17, 71 y.o.   MRN: [id number]   [facility] Progress Note    Subjective:   No acute events noted overnight, started on CRRT that he tolerated without problems    Objective:   BP 104/74  Pulse 84  Temp 93.6 F (34.2 C) (Axillary)  Resp 18  Ht 5\' 10"  (1.778 m)  Wt 106.8 kg (235 lb 7.2 oz)  BMI 33.78 kg/m2  SpO2 100%  Intake/Output Summary (Last 24 hours) at 01/05/12 0740 Last data filed at 01/05/12 0700  Gross per 24 hour  Intake  892.3 ml  Output   3136 ml  Net -2243.7 ml   Weight change:   Physical Exam: Gen: Intubated, sedated CVS: Pulse regular in rate and rhythm, heart sounds S1 and S2 normal Resp: Clear to auscultation bilaterally-mechanical breath sounds Abd: Soft, obese, nontender and bowel sounds are normal Ext: 2-3+ edema over upper and lower extremities  Imaging: US Renal Port  01/04/2012  *RADIOLOGY REPORT*  Clinical Data: Acute renal failure.  RENAL/URINARY TRACT ULTRASOUND COMPLETE  Comparison:  None  Findings:  Right Kidney:  11.4 cm in length.  Normal renal cortical thickness but slight increased echogenicity.  No focal renal lesions or hydronephrosis.  Left Kidney:  10.0 cm in length.  Normal renal cortical thickness and slight increased echogenicity but no focal renal lesion or hydronephrosis.  Bladder:  Decompressed by a Foley catheter.  IMPRESSION:  Slight increased echogenicity of both kidneys but no hydronephrosis.   Original Report Authenticated By: Rudie Meyer, M.D.    Dg Chest Port 1 View  01/04/2012  *RADIOLOGY REPORT*  Clinical Data: 71 year old male status post placement of catheter for dialysis.  PORTABLE CHEST - 1 VIEW  Comparison: None.  Findings: Semi upright AP portable view at 1604 hours. Endotracheal tube tip between level of clavicles and carina. Enteric tube courses to the abdomen, tip not included.  Left IJ approach single lumen catheter, tip terminates to the right of the  midline, probably at the innominate vein confluence.  Right IJ approach dual lumen dialysis type catheter, tip just below the level of the carina.   Cardiac size and mediastinal contours are within normal limits. Dense retrocardiac opacity.  Probable small effusions.  Pulmonary vascular congestion without overt edema.  No pneumothorax identified.  IMPRESSION: 1.  Right IJ dual lumen catheter placed, tip at the SVC level. 2.  Other lines and tubes placed as above. 3.  No pneumothorax. Small bilateral pleural effusions.  Vascular congestion.  Bilateral lower lobe collapse or consolidation.   Original Report Authenticated By: Erskine Speed, M.D.     Labs: BMET  Lab 01/05/12 0420 01/04/12 1528  NA 138 140  K 4.3 4.3  CL 104 104  CO2 24 19  GLUCOSE 79 126*  BUN 50* 68*  CREATININE 5.38* 7.29*  ALB -- --  CALCIUM 7.1* 6.8*  PHOS 4.7* 5.7*   CBC  Lab 01/05/12 0420 01/04/12 1528  WBC 3.6* 5.2  NEUTROABS -- --  HGB 10.2* 10.4*  HCT 31.7* 31.5*  MCV 80.7 80.4  PLT 142* 171    Medications:      . antiseptic oral rinse  15 mL Mouth Rinse QID  . chlorhexidine  15 mL Mouth Rinse BID  . [COMPLETED] heparin      . insulin aspart  2-6 Units Subcutaneous Q4H  . pantoprazole (PROTONIX) IV  40 mg Intravenous Q24H  . sodium chloride  3 mL Intravenous  Q12H  . [DISCONTINUED] cefTRIAXone (ROCEPHIN)  IV  2 g Intravenous Once     Assessment/ Plan:   1. Acute renal failure: timeline of events appear to favor ischemic ATN with cardiorespiratory arrest/renal hypoperfusion injury. Unfortunately remains anuric and volume overloaded. Started on CR RT overnight for or for filtration primarily-he is net -2 L overnight. We'll continue CR RT as the least hemodynamically harmful modality of ultrafiltration/clearance. 2. Saddle pulmonary embolus: hemodynamically stable and plans for anticoagulation when deemed appropriate by CCM  3. Elevated LFTs: Likely shock liver- monitor with improving BP  4. VDRF s/p  cardiorespiratory arrest: Remains intubated and on ventilator support, will continue ultrafiltration to assist with weaning.   Zetta Bills, MD 01/05/2012, 7:40 AM

## 2012-01-05 NOTE — Progress Notes (Signed)
INITIAL ADULT NUTRITION ASSESSMENT Date: 01/05/2012   Time: 10:10 AM  Reason for Assessment: MD Consult for TF initiation and management.  INTERVENTION:  Continue Osmolite 1.2 at 40 ml/h (goal rate).  Add Prostat 60 ml via tube TID to increase intake to 1752 kcals, 143 gm protein (99% of estimated needs), 787 ml free water daily.  TF plus Propofol will provide a total of 1939 kcals/day (25 kcals/kg ideal weight).  DOCUMENTATION CODES Per approved criteria  -Obesity Unspecified   ASSESSMENT: Male 71 y.o.  Dx: progressive worsening shortness of breath specifically upon exertion; found to have a large saddle pulmonary embolus by CT angiography.  Hx:  Past Medical History  Diagnosis Date  . Diabetes mellitus, type 2   . Hypercholesterolemia   . Former smoker   . Bilateral hearing loss     Wears hearing aids  . Chronic kidney disease   . Hearing loss     uses hearing aids    Past Surgical History  Procedure Date  . Spine surgery     Related Meds:  Scheduled Meds:   . antiseptic oral rinse  15 mL Mouth Rinse QID  . chlorhexidine  15 mL Mouth Rinse BID  . [COMPLETED] heparin      . insulin aspart  2-6 Units Subcutaneous Q4H  . pantoprazole (PROTONIX) IV  40 mg Intravenous Q24H  . sodium chloride  3 mL Intravenous Q12H  . [DISCONTINUED] cefTRIAXone (ROCEPHIN)  IV  2 g Intravenous Once   Continuous Infusions:   . feeding supplement (OSMOLITE 1.2 CAL) 1,000 mL (01/05/12 0750)  . fentaNYL infusion INTRAVENOUS 50 mcg/hr (01/05/12 0745)  . dialysis replacement fluid (prismasate) 400 mL/hr at 01/05/12 0842  . dialysis replacement fluid (prismasate) 300 mL/hr at 01/05/12 0935  . dialysate (PRISMASATE) 1,500 mL/hr at 01/05/12 0540  . propofol Stopped (01/05/12 0715)   PRN Meds:.sodium chloride, sodium chloride, fentaNYL, heparin, heparin, sodium chloride   Ht: 5\' 10"  (177.8 cm)  Wt: 235 lb 7.2 oz (106.8 kg)  Ideal Wt: 75.5 kg % Ideal Wt: 141%  Usual Wt:  unknown  Body mass index is 33.78 kg/(m^2).  Food/Nutrition Related Hx: Per patient's sister, he was eating well with no nutrition problems PTA.  Labs:  CMP     Component Value Date/Time   NA 138 01/05/2012 0420   K 4.3 01/05/2012 0420   CL 104 01/05/2012 0420   CO2 24 01/05/2012 0420   GLUCOSE 79 01/05/2012 0420   BUN 50* 01/05/2012 0420   CREATININE 5.38* 01/05/2012 0420   CALCIUM 7.1* 01/05/2012 0420   PROT 5.7* 01/04/2012 1528   ALBUMIN 1.9* 01/05/2012 0420   AST 155* 01/04/2012 1528   ALT 424* 01/04/2012 1528   ALKPHOS 135* 01/04/2012 1528   BILITOT 1.1 01/04/2012 1528   GFRNONAA 10* 01/05/2012 0420   GFRAA 11* 01/05/2012 0420    CBG (last 3)   Basename 01/05/12 0736 01/05/12 0344 01/05/12 0039  GLUCAP 100* 81 97    Sodium  Date/Time Value Range Status  01/05/2012  4:20 AM 138  135 - 145 mEq/L Final  01/04/2012  3:28 PM 140  135 - 145 mEq/L Final    Potassium  Date/Time Value Range Status  01/05/2012  4:20 AM 4.3  3.5 - 5.1 mEq/L Final  01/04/2012  3:28 PM 4.3  3.5 - 5.1 mEq/L Final    Phosphorus  Date/Time Value Range Status  01/05/2012  4:20 AM 4.7* 2.3 - 4.6 mg/dL Final  16/11/9602  5:40 PM 5.7*  2.3 - 4.6 mg/dL Final    Magnesium  Date/Time Value Range Status  01/05/2012  4:20 AM 2.3  1.5 - 2.5 mg/dL Final  40/98/1191  4:78 PM 2.0  1.5 - 2.5 mg/dL Final     Intake/Output Summary (Last 24 hours) at 01/05/12 1016 Last data filed at 01/05/12 1000  Gross per 24 hour  Intake 1079.58 ml  Output   3752 ml  Net -2672.42 ml    Diet Order:  NPO  Tube Feeding Order:  Osmolite 1.2 with goal rate of 40 ml/h will provide 1152 kcals, 53 gm protein, 787 ml free water daily   IVF:    feeding supplement (OSMOLITE 1.2 CAL) Last Rate: 1,000 mL (01/05/12 0750)  fentaNYL infusion INTRAVENOUS Last Rate: 50 mcg/hr (01/05/12 0745)  dialysis replacement fluid (prismasate) Last Rate: 400 mL/hr at 01/05/12 0842  dialysis replacement fluid (prismasate) Last  Rate: 300 mL/hr at 01/05/12 0935  dialysate (PRISMASATE) Last Rate: 1,500 mL/hr at 01/05/12 0540  propofol Last Rate: Stopped (01/05/12 0715)    Estimated Nutritional Needs:   Kcal: 2300 Protein: 145-155 gm Fluid: 2.3-2.4 liters  Patient is currently intubated on ventilator support.  MV: 15.6 Temp:Temp (24hrs), Avg:95.8 F (35.4 C), Min:93.1 F (33.9 C), Max:97.4 F (36.3 C)  Propofol at 7.1 ml/hr providing 187 kcals/day.  Received consult for TF initiation and management.  OG tube is in place with Osmolite 1.2 infusing at goal rate of 40 ml/h.  Tolerating TF well at this time.  Patient is receiving CVVHD for acute renal failure related to ischemic ATN with cardiocardiorespiratory arrest/renal hypoperfusion injury.  Patient admitted with fluid overload.  Suspect current weight is much higher than usual weight.  Usual weight is unknown.  Current BMI indicating obesity, class 1, reflects fluid overload.  NUTRITION DIAGNOSIS: Inadequate oral intake related to inability to eat as evidenced by NPO status.  MONITORING/EVALUATION(Goals):  Goal:  Enteral nutrition to provide 60-70% of estimated calorie needs (22-25 kcals/kg ideal body weight) and >/= 90% of estimated protein needs, based on ASPEN guidelines for permissive underfeeding in critically ill obese individuals.  Monitor:  TF tolerance/adequacy, weight trend, labs, I/O, vent status  EDUCATION NEEDS: -Education not appropriate at this time   Joaquin Courts, RD, LDN, CNSC Pager# [phone] After Hours Pager# [phone]  01/05/2012, 10:10 AM

## 2012-01-06 ENCOUNTER — Inpatient Hospital Stay (HOSPITAL_COMMUNITY): Payer: Medicare Other

## 2012-01-06 LAB — CBC
HCT: 29.9 % — ABNORMAL LOW (ref 39.0–52.0)
MCV: 79.5 fL (ref 78.0–100.0)
RBC: 3.76 MIL/uL — ABNORMAL LOW (ref 4.22–5.81)
WBC: 3.7 10*3/uL — ABNORMAL LOW (ref 4.0–10.5)

## 2012-01-06 LAB — RENAL FUNCTION PANEL
Albumin: 2 g/dL — ABNORMAL LOW (ref 3.5–5.2)
BUN: 36 mg/dL — ABNORMAL HIGH (ref 6–23)
CO2: 26 mEq/L (ref 19–32)
Calcium: 8 mg/dL — ABNORMAL LOW (ref 8.4–10.5)
Chloride: 102 mEq/L (ref 96–112)
Creatinine, Ser: 3.51 mg/dL — ABNORMAL HIGH (ref 0.50–1.35)
GFR calc Af Amer: 25 mL/min — ABNORMAL LOW (ref >90)
GFR calc non Af Amer: 16 mL/min — ABNORMAL LOW (ref >90)
Glucose, Bld: 178 mg/dL — ABNORMAL HIGH (ref 70–99)
Phosphorus: 3.8 mg/dL (ref 2.3–4.6)
Potassium: 3.8 mEq/L (ref 3.5–5.1)
Sodium: 138 mEq/L (ref 135–145)

## 2012-01-06 LAB — GLUCOSE, CAPILLARY
Glucose-Capillary: 128 mg/dL — ABNORMAL HIGH (ref 70–99)
Glucose-Capillary: 123 mg/dL — ABNORMAL HIGH (ref 70–99)

## 2012-01-06 LAB — PROTIME-INR: Prothrombin Time: 20.4 seconds — ABNORMAL HIGH (ref 11.6–15.2)

## 2012-01-06 LAB — HEPARIN LEVEL (UNFRACTIONATED)
Heparin Unfractionated: 1.05 IU/mL — ABNORMAL HIGH (ref 0.30–0.70)
Heparin Unfractionated: 0.57 IU/mL (ref 0.30–0.70)

## 2012-01-06 LAB — MAGNESIUM: Magnesium: 2.4 mg/dL (ref 1.5–2.5)

## 2012-01-06 MED ORDER — FENTANYL CITRATE 0.05 MG/ML IJ SOLN
25.0000 ug | INTRAMUSCULAR | Status: DC | PRN
  Administered 2012-01-06 – 2012-01-07 (×2): 50 ug via INTRAVENOUS
  Filled 2012-01-06 (×2): qty 2

## 2012-01-06 MED ORDER — HEPARIN (PORCINE) IN NACL 100-0.45 UNIT/ML-% IJ SOLN
1400.0000 [IU]/h | INTRAMUSCULAR | Status: DC
  Administered 2012-01-06: 1400 [IU]/h via INTRAVENOUS
  Filled 2012-01-06 (×3): qty 250

## 2012-01-06 MED ORDER — PROPOFOL 10 MG/ML IV EMUL
5.0000 ug/kg/min | INTRAVENOUS | Status: DC
  Administered 2012-01-06: 35 ug/kg/min via INTRAVENOUS
  Administered 2012-01-06: 20 ug/kg/min via INTRAVENOUS
  Administered 2012-01-06: 30 ug/kg/min via INTRAVENOUS
  Administered 2012-01-06: 35 ug/kg/min via INTRAVENOUS
  Filled 2012-01-06 (×4): qty 100

## 2012-01-06 MED ORDER — BISACODYL 10 MG RE SUPP
10.0000 mg | Freq: Every day | RECTAL | Status: DC | PRN
  Administered 2012-01-06: 10 mg via RECTAL
  Filled 2012-01-06: qty 1

## 2012-01-06 MED ORDER — HEPARIN (PORCINE) IN NACL 100-0.45 UNIT/ML-% IJ SOLN
1400.0000 [IU]/h | INTRAMUSCULAR | Status: DC
  Administered 2012-01-07 – 2012-01-09 (×2): 900 [IU]/h via INTRAVENOUS
  Administered 2012-01-11: 1200 [IU]/h via INTRAVENOUS
  Administered 2012-01-12 – 2012-01-16 (×6): 1400 [IU]/h via INTRAVENOUS
  Filled 2012-01-06 (×21): qty 250

## 2012-01-06 MED ORDER — MIDAZOLAM HCL 2 MG/2ML IJ SOLN
1.0000 mg | INTRAMUSCULAR | Status: DC | PRN
  Administered 2012-01-06 (×2): 2 mg via INTRAVENOUS
  Filled 2012-01-06: qty 2

## 2012-01-06 MED ORDER — SODIUM CHLORIDE 0.9 % IV BOLUS (SEPSIS)
500.0000 mL | Freq: Once | INTRAVENOUS | Status: AC
  Administered 2012-01-06: 500 mL via INTRAVENOUS

## 2012-01-06 MED ORDER — MIDAZOLAM HCL 2 MG/2ML IJ SOLN
INTRAMUSCULAR | Status: AC
  Administered 2012-01-06: 2 mg via INTRAVENOUS
  Filled 2012-01-06: qty 2

## 2012-01-06 MED ORDER — DOCUSATE SODIUM 50 MG/5ML PO LIQD
100.0000 mg | Freq: Every day | ORAL | Status: DC
  Administered 2012-01-06 – 2012-01-07 (×2): 100 mg
  Filled 2012-01-06 (×3): qty 10

## 2012-01-06 MED ORDER — DEXMEDETOMIDINE HCL IN NACL 200 MCG/50ML IV SOLN
0.4000 ug/kg/h | INTRAVENOUS | Status: DC
Start: 2012-01-06 — End: 2012-01-07
  Administered 2012-01-06: 6.226 ug/kg/h via INTRAVENOUS
  Administered 2012-01-06: 0.5 ug/kg/h via INTRAVENOUS
  Filled 2012-01-06 (×2): qty 50

## 2012-01-06 MED ORDER — ATROPINE SULFATE 1 MG/ML IJ SOLN
INTRAMUSCULAR | Status: AC
  Filled 2012-01-06: qty 1

## 2012-01-06 MED ORDER — PANTOPRAZOLE SODIUM 40 MG PO PACK
40.0000 mg | PACK | Freq: Every day | ORAL | Status: DC
  Administered 2012-01-06: 40 mg
  Filled 2012-01-06 (×2): qty 20

## 2012-01-06 NOTE — Progress Notes (Signed)
eLink Physician-Brief Progress Note Patient Name: Noah Torres DOB: 13-Feb-1941 MRN: [id number]  Date of Service  01/06/2012   HPI/Events of Note   bp map 53 with sbp 72 and HR 51 after precedex bolus  eICU Interventions  Dc bolus part of precedex Fluid bolus to increase bp   Intervention Category Intermediate Interventions: Other:  Noah Torres 01/06/2012, 5:47 PM

## 2012-01-06 NOTE — Progress Notes (Signed)
Pt with copious amounts of oral secretions draining onto Right IJ HD catheter dressing; dressing saturated and lifting from skin; Dressing and antimicrobial disk changed;will continue to monitor.  Burnard Bunting, RN

## 2012-01-06 NOTE — Progress Notes (Signed)
Name: Noah Torres MRN: [id number] DOB: 01-07-1941    LOS: 2  PCCM ADMISSION NOTE  History of Present Illness: 71 year old M with PMH of CKD (baseline Cr 1.9) and DM who presented to [facility] on 11/11 with dyspnea. He was found to have a lower extremity DVT and saddle PE with RV strain. Subsequently, he developed ARF, acidosis and shock. He was intubated on 11/13 and shortly thereafter had a cardiac arrest. He was given TPA and resuscitated. He was transferred to [facility] for evaluation and treatment of acute renal failure.   Lines / Drains: ETT CVL Rt HD Cath 11/18>>>  Cultures: Sputum 11/14>>> GNR (per outside) note  Antibiotics: Ceftriaxone 11/16>>>11/18  Tests / Events: 11/11 - Saddle PE noted on CT-A, started anticoagulation 11/12 - Echo shows PA pressure 58-60; Doppler shows LLE DVT 11/13 - Develops shock started on pressors, develops resp failure intubated, cardiac arrest - given TPA and resuscitated 11/14 - AKI (Creat increa 1.5-->2.7) 11/16 - GRN in sputum, concern for BLL PNA, start on rocephin 11/17 - stopped all pressors 11/18 - transferred to [facility] and started on CRRT   Overnight: no acute events   Vital Signs: Temp:  [97.3 F (36.3 C)-98.4 F (36.9 C)] 97.6 F (36.4 C) (11/20 0730) Pulse Rate:  [73-117] 83  (11/20 0800) Resp:  [12-31] 14  (11/20 0800) BP: (94-166)/(59-98) 151/92 mmHg (11/20 0800) SpO2:  [99 %-100 %] 100 % (11/20 0800) FiO2 (%):  [30 %-40 %] 30 % (11/20 0800) Weight:  [226 lb 10.1 oz (102.8 kg)] 226 lb 10.1 oz (102.8 kg) (11/20 0456) I/O last 3 completed shifts: In: 2708.8 [I.V.:1398.8; NG/GT:1300; IV Piggyback:10] Out: 40981 [Urine:590; Other:9682]   Intake/Output Summary (Last 24 hours) at 01/06/12 0831 Last data filed at 01/06/12 0800  Gross per 24 hour  Intake 2038.43 ml  Output   7181 ml  Net -5142.57 ml     Physical Examination: General:  Intubated, obtunded, less fluid overloaded Neuro:  Sedated, opens eyes to  commands but does not follow other commands; withdraws to painful stimulus HEENT:  PERRLA, OETT in place,  Neck:  No lines in place   Cardiovascular:  RRR, no M,R,G Lungs:  Course breath sounds bilaterally, scattered wheezes, no rales Abdomen:  Anasarca present, NDNT, hypoactive bowel sounds  Musculoskeletal:  3+ edema upper extremities, 2+ edema lower extremities  Skin: intact, no rashes or ecchymosis  Genital: marked scrotal swelling  Ventilator settings: Vent Mode:  [-] PSV;CPAP FiO2 (%):  [30 %-40 %] 30 % Set Rate:  [15 bmp] 15 bmp Vt Set:  [580 mL] 580 mL PEEP:  [5 cmH20] 5 cmH20 Pressure Support:  [5 cmH20] 5 cmH20 Plateau Pressure:  [11 cmH20-27 cmH20] 16 cmH20  Labs and Imaging:  Reviewed.  Please refer to the Assessment and Plan section for relevant results.  ASSESSMENT AND PLAN  PULMONARY  Lab 01/04/12 1621  PHART 7.406  PCO2ART 30.9*  PO2ART 50.0*  HCO3 19.6*  O2SAT 87.0   A:  Acute Respiratory Failure 2/2 saddle PE     ? Aspiration  Pneumonia per outside hospital      Pulm Edema  P: Wean vent since doing well, RSBI < 50 Continue to pull fluids    CARDIOVASCULAR No results found for this basename: TROPONINI:5,LATICACIDVEN:5, O2SATVEN:5,PROBNP:5 in the last 168 hours A:  Resolved Shock, s/p pressors      HTN P: - trend BP  RENAL  Lab 01/06/12 0230 01/05/12 1600 01/05/12 0420 01/04/12 1528  NA 137  136 138 140  K 3.8 4.2 -- --  CL 102 101 104 104  CO2 26 24 24 19   BUN 36* 40* 50* 68*  CREATININE 3.51* 4.43* 5.38* 7.29*  CALCIUM 7.8* 7.5* 7.1* 6.8*  MG 2.4 -- 2.3 2.0  PHOS 4.1 3.7 4.7* 5.7*   A:  AKI, contrast induced followed by ATN P: - on CRRT per renal - at least 24 more hours   GASTROINTESTINAL  Lab 01/06/12 0230 01/05/12 1600 01/05/12 0420 01/04/12 1528  AST -- -- -- 155*  ALT -- -- -- 424*  ALKPHOS -- -- -- 135*  BILITOT -- -- -- 1.1  PROT -- -- -- 5.7*  ALBUMIN 2.1* 2.1* 1.9* 2.0*   A:  Severe Protein Malnutrition        Constipation P: - Continue tube feeds - Colace daily and PRN dulcolax suppository    HEMATOLOGIC  Lab 01/06/12 0230 01/06/12 0130 01/05/12 1600 01/05/12 0420 01/04/12 1830 01/04/12 1528  HGB -- 9.9* -- 10.2* -- 10.4*  HCT -- 29.9* -- 31.7* -- 31.5*  PLT -- 124* -- 142* -- 171  INR 1.82* -- 1.93* 2.01* 2.13* --  APTT -- -- -- -- -- --   A:  LLE DVT with subsequent PE P: - no therapeutic heparin  INFECTIOUS  Lab 01/06/12 0130 01/05/12 0420 01/04/12 1830 01/04/12 1528  WBC 3.7* 3.6* -- 5.2  PROCALCITON -- -- 4.80 --   A: Reported GNR on sputum at OSH, s/p Rocephin x 3 days P: - Restart antibiotics if develops fever or new infx - hold for now since no fever, low WC & no clear infx  ENDOCRINE  Lab 01/06/12 0742 01/06/12 0418 01/05/12 2356 01/05/12 1934 01/05/12 1538  GLUCAP 122* 111* 140* 178* 118*   A:  DM P: - Phase 1 ICU Hyperglycemia protocol  NEUROLOGIC A:  Sedation, possible anoxic injury vs uremic encephalopathy P: - minimal improvement overnight, obtain CT head  Best practices / Disposition: -->ICU status under PCCM -->full code -->Heparin PE treatment -->No GI PPX -->ventilator bundle -->diet - NPO -->sister updated ont he phone 11/18 & consent obtained for HD catheter  Si Raider. Clinton Sawyer, MD, MBA 01/06/2012, 8:30 AM Family Medicine Resident, PGY-2 [phone] pager   I have examined the patient and reviewed the database. I have formulated the assessment and plan as reflected in the note above with amendments made by me. 35 mins of direct critical care time provided. Family members updated. Depressed cognition prohibits extubation  Billy Fischer, MD;  PCCM service; [place] [phone]

## 2012-01-06 NOTE — Progress Notes (Signed)
Patient ID: Noah Torres, male   DOB: 1941/01/02, 71 y.o.   MRN: [id number]   [facility] Progress Note    Subjective:   No acute events noted overnight, tolerating CR RT without problems.    Objective:   BP 166/98  Pulse 86  Temp 97.6 F (36.4 C) (Oral)  Resp 16  Ht 5\' 10"  (1.778 m)  Wt 102.8 kg (226 lb 10.1 oz)  BMI 32.52 kg/m2  SpO2 100%  Intake/Output Summary (Last 24 hours) at 01/06/12 0800 Last data filed at 01/06/12 0700  Gross per 24 hour  Intake 1940.03 ml  Output   6867 ml  Net -4926.97 ml   Weight change: -11 kg (-24 lb 4 oz)  Physical Exam: Gen: Comfortably resting on the ventilator, sedated on propofol CVS: Pulse regular in rate and rhythm, heart sounds S1 with loud S2 Resp: Coarse/mechanical breath sounds Abd: Soft, obese, nontender and bowel sounds are normal Ext: 2+ edema over upper and lower extremities  Imaging: US Renal Port  01/04/2012  *RADIOLOGY REPORT*  Clinical Data: Acute renal failure.  RENAL/URINARY TRACT ULTRASOUND COMPLETE  Comparison:  None  Findings:  Right Kidney:  11.4 cm in length.  Normal renal cortical thickness but slight increased echogenicity.  No focal renal lesions or hydronephrosis.  Left Kidney:  10.0 cm in length.  Normal renal cortical thickness and slight increased echogenicity but no focal renal lesion or hydronephrosis.  Bladder:  Decompressed by a Foley catheter.  IMPRESSION:  Slight increased echogenicity of both kidneys but no hydronephrosis.   Original Report Authenticated By: Rudie Meyer, M.D.    Dg Chest Port 1 View  01/06/2012  *RADIOLOGY REPORT*  Clinical Data: Endotracheal tube placement, shortness of breath.  PORTABLE CHEST - 1 VIEW  Comparison: 01/05/2012.  Findings: Endotracheal tube is in satisfactory position. Nasogastric tube is followed into the stomach with the tip projecting beyond the inferior boundary of the film.  Right IJ central line tip projects over the SVC.  Left IJ central line  tip projects in the region of the left brachiocephalic vein.  Heart size stable. Bilateral mid/lower lung zone airspace disease and left lower lobe collapse/consolidation persist.  Bilateral pleural effusions.  Biapical pleural thickening.  IMPRESSION: Probable congestive heart failure with left lower lobe collapse/consolidation.   Original Report Authenticated By: Leanna Battles, M.D.    Dg Chest Port 1 View  01/04/2012  *RADIOLOGY REPORT*  Clinical Data: 71 year old male status post placement of catheter for dialysis.  PORTABLE CHEST - 1 VIEW  Comparison: None.  Findings: Semi upright AP portable view at 1604 hours. Endotracheal tube tip between level of clavicles and carina. Enteric tube courses to the abdomen, tip not included.  Left IJ approach single lumen catheter, tip terminates to the right of the midline, probably at the innominate vein confluence.  Right IJ approach dual lumen dialysis type catheter, tip just below the level of the carina.   Cardiac size and mediastinal contours are within normal limits. Dense retrocardiac opacity.  Probable small effusions.  Pulmonary vascular congestion without overt edema.  No pneumothorax identified.  IMPRESSION: 1.  Right IJ dual lumen catheter placed, tip at the SVC level. 2.  Other lines and tubes placed as above. 3.  No pneumothorax. Small bilateral pleural effusions.  Vascular congestion.  Bilateral lower lobe collapse or consolidation.   Original Report Authenticated By: Erskine Speed, M.D.     Labs: BMET  Lab 01/06/12 0230 01/05/12 1600 01/05/12 0420 01/04/12 1528  NA  137 136 138 140  K 3.8 4.2 4.3 4.3  CL 102 101 104 104  CO2 26 24 24 19   GLUCOSE 123* 140* 79 126*  BUN 36* 40* 50* 68*  CREATININE 3.51* 4.43* 5.38* 7.29*  ALB -- -- -- --  CALCIUM 7.8* 7.5* 7.1* 6.8*  PHOS 4.1 3.7 4.7* 5.7*   CBC  Lab 01/06/12 0130 01/05/12 0420 01/04/12 1528  WBC 3.7* 3.6* 5.2  NEUTROABS -- -- --  HGB 9.9* 10.2* 10.4*  HCT 29.9* 31.7* 31.5*  MCV 79.5  80.7 80.4  PLT 124* 142* 171    Medications:      . antiseptic oral rinse  15 mL Mouth Rinse QID  . chlorhexidine  15 mL Mouth Rinse BID  . feeding supplement (OSMOLITE 1.2 CAL)  1,000 mL Per Tube Q24H  . feeding supplement  60 mL Per Tube TID  . insulin aspart  2-6 Units Subcutaneous Q4H  . pantoprazole (PROTONIX) IV  40 mg Intravenous Q24H  . [DISCONTINUED] sodium chloride  3 mL Intravenous Q12H     Assessment/ Plan:   1. Acute renal failure: timeline of events appear to favor ischemic ATN with cardiorespiratory arrest/renal hypoperfusion injury. Urine output noted to be picking up-has put out about 500 cc of urine over the past 24 hours in spite of ultrafiltration of about 4 L on CRT. Renal recovery appears to be underway (functional recovery will obviously lag). Plan to continue CRRT for another 24 hours with possible discontinuation tomorrow if urine output continues to improve and labs allow to do so.  2. Saddle pulmonary embolus: hemodynamically stable and on anti-coagulation with heparin per pharmacy. 3. Elevated LFTs: Likely shock liver- monitor with improving BP  4. VDRF s/p cardiorespiratory arrest: Remains intubated and on ventilator support, will continue ultrafiltration to assist with weaning.   Zetta Bills, MD 01/06/2012, 8:00 AM

## 2012-01-06 NOTE — Progress Notes (Signed)
Pt has moderate amount of bloody drainage from foley catheter insertion site; small amount of bloody oral secretions noted; Jessica from pharmacy notified; Heparin drip remains on hold; will continue to monitor.  Burnard Bunting, RN

## 2012-01-06 NOTE — Progress Notes (Signed)
ANTICOAGULATION CONSULT NOTE  Pharmacy Consult for Heparin Indication: pulmonary embolus  Allergies  Allergen Reactions  . Metformin And Related     Patient Measurements: Height: 5\' 10"  (177.8 cm) Weight: 235 lb 7.2 oz (106.8 kg) IBW/kg (Calculated) : 73  Heparin Dosing Weight: 98 kg  Vital Signs: Temp: 97.7 F (36.5 C) (11/20 0006) Temp src: Oral (11/20 0006) BP: 100/61 mmHg (11/20 0300) Pulse Rate: 85  (11/20 0300)  Labs:  Basename 01/06/12 0130 01/05/12 1600 01/05/12 0420 01/04/12 1830 01/04/12 1528  HGB 9.9* -- 10.2* -- --  HCT 29.9* -- 31.7* -- 31.5*  PLT 124* -- 142* -- 171  APTT -- -- -- -- --  LABPROT -- 21.3* 22.0* 22.9* --  INR -- 1.93* 2.01* 2.13* --  HEPARINUNFRC 1.05* -- -- -- --  CREATININE -- 4.43* 5.38* -- 7.29*  CKTOTAL -- -- -- -- --  CKMB -- -- -- -- --  TROPONINI -- -- -- -- --    Estimated Creatinine Clearance: 19 ml/min (by C-G formula based on Cr of 4.43).  Assessment: 71 y/o male with saddle PE.for Heparin Goal of Therapy:  Heparin level 0.3-0.7 units/ml Monitor platelets by anticoagulation protocol: Yes   Plan:  Hold Heparin x 1 hour, then decrease heparin 1400 units/hr Check heparin level in 8 hours.  Geannie Risen, PharmD, BCPS  01/06/2012 3:10 AM

## 2012-01-06 NOTE — Progress Notes (Signed)
Name: Noah Torres  MRN: [id number]  DOB: [birth year]  Brief Progress Note.   S: Called regarding pseudomonas on tracheal aspirate culture. Patient still intubated and sedated.   O: BP 109/69  Pulse 74  Temp 97.4 F (36.3 C) (Oral)  Resp 16  Ht 5\' 10"  (1.778 m)  Wt 226 lb 10.1 oz (102.8 kg)  BMI 32.52 kg/m2  SpO2 100% Gen: elderly AAM, intubated sedated Pulm: minimal secretions, normal WOB on PS, norma respiratory rate and lungs CTA-B  A/P: 71 year old M with large saddle PE s/p PEA with AKI and now on mechanical ventilation.  Pseudomonas on trach aspirate - At this point, I favor this to be contaminant/colonized microbe b/c patient without signs or symptoms of infection. Continue to monitor with low threshold for initiating anti-pseudomonal antibiotics.   Si Raider Clinton Sawyer, MD, MBA 01/06/2012, 3:44 PM Family Medicine Resident, PGY-2 [phone] pager

## 2012-01-06 NOTE — Progress Notes (Signed)
ANTICOAGULATION CONSULT NOTE - Follow Up Consult  Pharmacy Consult for Heparin  Indication: pulmonary embolus  Allergies  Allergen Reactions  . Metformin And Related     Patient Measurements: Height: 5\' 10"  (177.8 cm) Weight: 226 lb 10.1 oz (102.8 kg) IBW/kg (Calculated) : 73  Heparin Dosing Weight: 94.7 kg  Vital Signs: Temp: 97.4 F (36.3 C) (11/20 1207) Temp src: Oral (11/20 1207) BP: 156/90 mmHg (11/20 1300) Pulse Rate: 82  (11/20 1300)  Labs:  Basename 01/06/12 1200 01/06/12 0230 01/06/12 0130 01/05/12 1600 01/05/12 0420 01/04/12 1528  HGB -- -- 9.9* -- 10.2* --  HCT -- -- 29.9* -- 31.7* 31.5*  PLT -- -- 124* -- 142* 171  APTT -- -- -- -- -- --  LABPROT -- 20.4* -- 21.3* 22.0* --  INR -- 1.82* -- 1.93* 2.01* --  HEPARINUNFRC 1.20* -- 1.05* -- -- --  CREATININE -- 3.51* -- 4.43* 5.38* --  CKTOTAL -- -- -- -- -- --  CKMB -- -- -- -- -- --  TROPONINI -- -- -- -- -- --    Estimated Creatinine Clearance: 23.5 ml/min (by C-G formula based on Cr of 3.51).   Medications:  Scheduled:    . antiseptic oral rinse  15 mL Mouth Rinse QID  . chlorhexidine  15 mL Mouth Rinse BID  . docusate  100 mg Per Tube Daily  . feeding supplement (OSMOLITE 1.2 CAL)  1,000 mL Per Tube Q24H  . feeding supplement  60 mL Per Tube TID  . insulin aspart  2-6 Units Subcutaneous Q4H  . pantoprazole (PROTONIX) IV  40 mg Intravenous Q24H  . [DISCONTINUED] sodium chloride  3 mL Intravenous Q12H   Infusions:    . heparin 1,400 Units/hr (01/06/12 0907)  . dialysis replacement fluid (prismasate) 400 mL/hr at 01/06/12 1108  . dialysis replacement fluid (prismasate) 300 mL/hr at 01/06/12 0630  . dialysate (PRISMASATE) 1,500 mL/hr at 01/06/12 1003  . propofol 30 mcg/kg/min (01/06/12 1300)  . [DISCONTINUED] heparin 1,700 Units/hr (01/05/12 1718)  . [DISCONTINUED] propofol 45 mcg/kg/min (01/06/12 0000)    Assessment: 70 yo male with saddle PE on heparin. HL returned supratherapeutic at 1.20  on heparin 1400 units/hr despite previously held for 1 hour and rate decreased by ~3 units/kg/hr. Initially on Lovenox + Coumadin (12/28/11) and given tPA on (12/30/11) d/t cardiac arrest. No s/sx of bleeding per nurse. H/H stable but Plt slowly trending down. Renal function is improving as evidenced by an increase in urine output.   Goal of Therapy:  Heparin level 0.3-0.7 units/ml Monitor platelets by anticoagulation protocol: Yes   Plan:  Hold heparin infusion for 1 hour then restart heparin infusion at 1000 units/hr. Check anti-Xa level in 8 hours and daily while on heparin. Continue to monitor H/H, Plt and s/sx of bleeding.  Tiney Rouge, PharmD Candidate 01/06/2012,1:30 PM  - Peripheral repeat stick was 0.57 after drip had been off for 45 minutes so inclined to believe he did have elevated level. Patient also with new oozing from penis and mouth. Discussed with Dr. Sung Amabile- clotting higher risk than bleeding and wants to continue.   -Will  hold x1.5 hr and then restart at decreased rate of 1000 units/hr with instructions to nursing to call pharmacist [phone]) if further bleeding.   Link Snuffer, PharmD, BCPS Clinical Pharmacist [phone] 01/06/2012, 3:11 PM

## 2012-01-07 ENCOUNTER — Inpatient Hospital Stay (HOSPITAL_COMMUNITY): Payer: Medicare Other

## 2012-01-07 LAB — CULTURE, RESPIRATORY W GRAM STAIN: Special Requests: NORMAL

## 2012-01-07 LAB — RENAL FUNCTION PANEL
CO2: 27 mEq/L (ref 19–32)
Chloride: 101 mEq/L (ref 96–112)
GFR calc Af Amer: 32 mL/min — ABNORMAL LOW (ref >90)
GFR calc non Af Amer: 28 mL/min — ABNORMAL LOW (ref >90)
Sodium: 137 mEq/L (ref 135–145)

## 2012-01-07 LAB — CBC
HCT: 32.4 % — ABNORMAL LOW (ref 39.0–52.0)
MCV: 80.8 fL (ref 78.0–100.0)
RBC: 4.01 MIL/uL — ABNORMAL LOW (ref 4.22–5.81)
RDW: 18.5 % — ABNORMAL HIGH (ref 11.5–15.5)
WBC: 4.5 10*3/uL (ref 4.0–10.5)

## 2012-01-07 LAB — GLUCOSE, CAPILLARY
Glucose-Capillary: 134 mg/dL — ABNORMAL HIGH (ref 70–99)
Glucose-Capillary: 109 mg/dL — ABNORMAL HIGH (ref 70–99)

## 2012-01-07 LAB — HEPARIN LEVEL (UNFRACTIONATED): Heparin Unfractionated: 0.7 IU/mL (ref 0.30–0.70)

## 2012-01-07 MED ORDER — PROPOFOL 10 MG/ML IV EMUL
5.0000 ug/kg/min | INTRAVENOUS | Status: DC
  Administered 2012-01-07 (×3): 35 ug/kg/min via INTRAVENOUS
  Filled 2012-01-07 (×2): qty 100

## 2012-01-07 MED ORDER — PANTOPRAZOLE SODIUM 40 MG IV SOLR
40.0000 mg | INTRAVENOUS | Status: DC
  Administered 2012-01-07 – 2012-01-11 (×5): 40 mg via INTRAVENOUS
  Filled 2012-01-07 (×6): qty 40

## 2012-01-07 MED ORDER — GLYCERIN (LAXATIVE) 2.1 G RE SUPP
1.0000 | Freq: Once | RECTAL | Status: AC
  Administered 2012-01-07: 1 via RECTAL
  Filled 2012-01-07: qty 1

## 2012-01-07 MED ORDER — METOPROLOL TARTRATE 1 MG/ML IV SOLN
2.5000 mg | INTRAVENOUS | Status: DC | PRN
  Administered 2012-01-07 – 2012-01-08 (×2): 5 mg via INTRAVENOUS
  Filled 2012-01-07 (×3): qty 5

## 2012-01-07 MED ORDER — FENTANYL CITRATE 0.05 MG/ML IJ SOLN: 25.0000 ug | INTRAMUSCULAR | Status: DC | PRN

## 2012-01-07 MED ORDER — ALBUTEROL SULFATE (5 MG/ML) 0.5% IN NEBU
2.5000 mg | INHALATION_SOLUTION | RESPIRATORY_TRACT | Status: DC | PRN
  Administered 2012-01-07 – 2012-01-08 (×3): 2.5 mg via RESPIRATORY_TRACT
  Filled 2012-01-07 (×3): qty 0.5

## 2012-01-07 MED ORDER — HYDRALAZINE HCL 20 MG/ML IJ SOLN
10.0000 mg | INTRAMUSCULAR | Status: DC | PRN
  Administered 2012-01-10 – 2012-01-17 (×4): 10 mg via INTRAVENOUS
  Administered 2012-01-18: 20 mg via INTRAVENOUS
  Filled 2012-01-07 (×5): qty 2

## 2012-01-07 MED ORDER — WHITE PETROLATUM GEL
Status: AC
  Administered 2012-01-07: 10:00:00
  Filled 2012-01-07: qty 5

## 2012-01-07 NOTE — Progress Notes (Signed)
1225 Pt extubated to 4 LPM nasal cannula with SPO2 100%, HR 108, RR 23, and clear, if slightly diminished,  bilateral breath sounds.  Pt refused to make an attempt to speak.

## 2012-01-07 NOTE — Progress Notes (Signed)
NTS performed with copious thick tan, blood streaked sputum obtained.

## 2012-01-07 NOTE — Progress Notes (Signed)
ANTICOAGULATION CONSULT NOTE  Pharmacy Consult for Heparin Indication: pulmonary embolus  Allergies  Allergen Reactions  . Metformin And Related     Patient Measurements: Height: 5\' 10"  (177.8 cm) Weight: 217 lb 2.5 oz (98.5 kg) IBW/kg (Calculated) : 73  Heparin Dosing Weight: 98 kg  Vital Signs: Temp: 98.6 F (37 C) (11/21 1204) Temp src: Oral (11/21 1204) BP: 125/72 mmHg (11/21 1800) Pulse Rate: 108  (11/21 1800)  Labs:  Basename 01/07/12 1625 01/07/12 0500 01/06/12 2316 01/06/12 1520 01/06/12 0230 01/06/12 0130 01/05/12 1600 01/05/12 0420  HGB -- 10.5* -- -- -- 9.9* -- --  HCT -- 32.4* -- -- -- 29.9* -- 31.7*  PLT -- 110* -- -- -- 124* -- 142*  APTT -- -- -- -- -- -- -- --  LABPROT -- 16.9* -- -- 20.4* -- 21.3* --  INR -- 1.41 -- -- 1.82* -- 1.93* --  HEPARINUNFRC 0.70 0.72* 0.64 -- -- -- -- --  CREATININE -- 2.24* -- 2.79* 3.51* -- -- --  CKTOTAL -- -- -- -- -- -- -- --  CKMB -- -- -- -- -- -- -- --  TROPONINI -- -- -- -- -- -- -- --    Estimated Creatinine Clearance: 36.1 ml/min (by C-G formula based on Cr of 2.24).  Assessment: 71 y/o M on heparin for saddle PE.  Heparin level this evening is at goal.  Spoke with RN, heparin currently infusing via R IJ, HD cath.  Heparin level has been repeatedly attempted and failed peripheral stick due to edema.  Consequently, heparin is being held x 5 min, then the level is being drawn from the L central line.  Will continue this for now until it is practical to attempt to place a peripheral line or obtain a peripheral stick.  Minor hematuria continues, but improved from previous.  Minor oozing still noted from IJ lines.  Goal of Therapy:  Heparin level 0.3-0.7 units/ml Monitor platelets by anticoagulation protocol: Yes   Plan:  - Continue heparin to 900 units/hr - Continue daily heparin level and CBC  Celedonio Miyamoto, PharmD, BCPS Clinical Pharmacist Pager [phone]  01/07/2012 6:14 PM

## 2012-01-07 NOTE — Progress Notes (Signed)
Patient ID: Noah Torres, male   DOB: 1940/03/24, 71 y.o.   MRN: [id number]   [facility] Progress Note    Subjective:   Episodes of profound bradycardia noted associated with Precedex. Offending medication stopped, transient atropine used.    Objective:   BP 87/55  Pulse 76  Temp 93.8 F (34.3 C) (Oral)  Resp 15  Ht 5\' 10"  (1.778 m)  Wt 98.5 kg (217 lb 2.5 oz)  BMI 31.16 kg/m2  SpO2 100%  Intake/Output Summary (Last 24 hours) at 01/07/12 0734 Last data filed at 01/07/12 0700  Gross per 24 hour  Intake 2575.86 ml  Output   6744 ml  Net -4168.14 ml   Weight change: -4.3 kg (-9 lb 7.7 oz)  Physical Exam: Gen: Intubated, sedated-visibly less anasarca CVS: pulse regular in rate and rhythm, heart sounds S1 and S2 normal Resp: Clear to auscultation, mechanical breath sounds  Abd: soft, obese, nontender and bowel sounds are normal Ext: decreased edema-currently 1+ to 2+  Imaging: Dg Chest Port 1 View  01/06/2012  *RADIOLOGY REPORT*  Clinical Data: Endotracheal tube placement, shortness of breath.  PORTABLE CHEST - 1 VIEW  Comparison: 01/05/2012.  Findings: Endotracheal tube is in satisfactory position. Nasogastric tube is followed into the stomach with the tip projecting beyond the inferior boundary of the film.  Right IJ central line tip projects over the SVC.  Left IJ central line tip projects in the region of the left brachiocephalic vein.  Heart size stable. Bilateral mid/lower lung zone airspace disease and left lower lobe collapse/consolidation persist.  Bilateral pleural effusions.  Biapical pleural thickening.  IMPRESSION: Probable congestive heart failure with left lower lobe collapse/consolidation.   Original Report Authenticated By: Leanna Battles, M.D.     Labs: BMET  Lab 01/07/12 0500 01/06/12 1520 01/06/12 0230 01/05/12 1600 01/05/12 0420 01/04/12 1528  NA 137 138 137 136 138 140  K 4.4 4.0 3.8 4.2 4.3 4.3  CL 101 102 102 101 104 104  CO2 27  26 26 24 24 19   GLUCOSE 190* 178* 123* 140* 79 126*  BUN 30* 31* 36* 40* 50* 68*  CREATININE 2.24* 2.79* 3.51* 4.43* 5.38* 7.29*  ALB -- -- -- -- -- --  CALCIUM 8.3* 8.0* 7.8* 7.5* 7.1* 6.8*  PHOS 3.3 3.8 4.1 3.7 4.7* 5.7*   CBC  Lab 01/07/12 0500 01/06/12 0130 01/05/12 0420 01/04/12 1528  WBC 4.5 3.7* 3.6* 5.2  NEUTROABS -- -- -- --  HGB 10.5* 9.9* 10.2* 10.4*  HCT 32.4* 29.9* 31.7* 31.5*  MCV 80.8 79.5 80.7 80.4  PLT 110* 124* 142* 171    Medications:      . antiseptic oral rinse  15 mL Mouth Rinse QID  . [EXPIRED] atropine      . chlorhexidine  15 mL Mouth Rinse BID  . docusate  100 mg Per Tube Daily  . feeding supplement (OSMOLITE 1.2 CAL)  1,000 mL Per Tube Q24H  . feeding supplement  60 mL Per Tube TID  . insulin aspart  2-6 Units Subcutaneous Q4H  . pantoprazole sodium  40 mg Per Tube QHS  . [COMPLETED] sodium chloride  500 mL Intravenous Once  . [DISCONTINUED] pantoprazole (PROTONIX) IV  40 mg Intravenous Q24H     Assessment/ Plan:   1. Acute renal failure: timeline of events appear to favor ischemic ATN with cardiorespiratory arrest/renal hypoperfusion injury. Urine output 600 cc of urine over the past 24 hours.  We'll continue CRRT for another 24 hours and  discontinue tomorrow-plan to watch over the weekend for emerging dialysis needs. Blood pressures appear to indicates that he may be able to tolerate intermittent dialysis if needed to restart. 2. Saddle pulmonary embolus: hemodynamically stable and on anti-coagulation with heparin per pharmacy.  3. Elevated LFTs: Likely shock liver- monitor with improving BP  4. VDRF s/p cardiorespiratory arrest: Remains intubated and on ventilator support, will continue ultrafiltration to assist with weaning.   Zetta Bills, MD 01/07/2012, 7:34 AM

## 2012-01-07 NOTE — Progress Notes (Addendum)
Precedex restarted at 1830 @ 0.10mcg , after inline/oral suctioning patient coughing over vent. Pt vagaled down, HR in the 20s. Precedex stopped immediately. Code cart and atropine at bedside, HR returned to the 60s. Will continue to monitor patient.

## 2012-01-07 NOTE — Progress Notes (Signed)
Name: Noah Torres MRN: [id number] DOB: 10-19-1940    LOS: 3  PCCM ADMISSION NOTE  History of Present Illness: 71 year old M with PMH of CKD (baseline Cr 1.9) and DM who presented to [facility] on 11/11 with dyspnea. He was found to have a lower extremity DVT and saddle PE with RV strain. Subsequently, he developed ARF, acidosis and shock. He was intubated on 11/13 and shortly thereafter had a cardiac arrest. He was given TPA and resuscitated. He was transferred to [facility] for evaluation and treatment of acute renal failure.   Lines / Drains: ETT CVL Rt HD Cath 11/18>>>  Cultures: Sputum 11/14>>> GNR (per outside) note Trach Aspirate 11/18>>> moderate pseudomonas  Antibiotics: Ceftriaxone 11/16>>>11/18  Tests / Events: 11/11 - Saddle PE noted on CT-A, started anticoagulation 11/12 - Echo shows PA pressure 58-60; Doppler shows LLE DVT 11/13 - Develops shock started on pressors, develops resp failure intubated, cardiac arrest - given TPA and resuscitated 11/14 - AKI (Creat increa 1.5-->2.7) 11/16 - GRN in sputum, concern for BLL PNA, start on rocephin 11/17 - stopped all pressors 11/18 - transferred to [facility] and started on CRRT  11/20 - Multiple episodes of bradycardia/hypotension 22/ precedex use, so med stopped  Overnight: Precedex stopped secondary to bradycardia and hypotension; patient very agitated when off sedation and would not respond to family or follow commands; started back on propofol  Vital Signs: Temp:  [93.8 F (34.3 C)-97.9 F (36.6 C)] 93.8 F (34.3 C) (11/21 0334) Pulse Rate:  [51-99] 93  (11/21 0800) Resp:  [11-22] 14  (11/21 0800) BP: (62-173)/(42-99) 128/73 mmHg (11/21 0800) SpO2:  [96 %-100 %] 100 % (11/21 0800) FiO2 (%):  [30 %] 30 % (11/21 0800) Weight:  [217 lb 2.5 oz (98.5 kg)] 217 lb 2.5 oz (98.5 kg) (11/21 0400) I/O last 3 completed shifts: In: 3600.9 [I.V.:1090.9; NG/GT:2010; IV Piggyback:500] Out: 40981 [Urine:897;  Other:9351]   Intake/Output Summary (Last 24 hours) at 01/07/12 0852 Last data filed at 01/07/12 0800  Gross per 24 hour  Intake 2542.91 ml  Output   6687 ml  Net -4144.09 ml     Physical Examination: General:  Intubated, obtunded, less fluid overloaded Neuro:  Sedated, does not follow other commands; withdraws to painful stimulus HEENT:  PERRLA, OETT in place,  Neck:  No lines in place   Cardiovascular:  RRR, no M,R,G Lungs: CTA bilaterally, no wheezes or rales Abdomen:  Anasarca improving, NDNT, hypoactive bowel sounds  Musculoskeletal:  2+ edema upper extremities, 2+ edema lower extremities  Skin: intact, no rashes or ecchymosis  Genital: still marked scrotal swelling  Ventilator settings: Vent Mode:  [-] PSV;CPAP FiO2 (%):  [30 %] 30 % Set Rate:  [15 bmp] 15 bmp Vt Set:  [580 mL] 580 mL PEEP:  [5 cmH20] 5 cmH20 Pressure Support:  [5 cmH20] 5 cmH20 Plateau Pressure:  [5 cmH20-22 cmH20] 5 cmH20   Labs and Imaging:  Reviewed.  Please refer to the Assessment and Plan section for relevant results.   ASSESSMENT AND PLAN  PULMONARY A:  Acute Respiratory Failure 2/2 saddle PE     ? Aspiration  Pneumonia per outside hospital      Pulm Edema  P: Attempt extubation today since tolerating wean   CARDIOVASCULAR No results found for this basename: TROPONINI:5,LATICACIDVEN:5, O2SATVEN:5,PROBNP:5 in the last 168 hours A:  Bradycardia transient 2/2 medication      P: - Avoid precedex, no pressors currently, cont to monitor  RENAL A:  AKI, contrast  induced followed by ATN P: - on CRRT - renal MD recs 24 more hours   GASTROINTESTINAL A:  Severe Protein Malnutrition       Constipation P: - Continue tube feeds - Cont Colace daily and PRN dulcolax suppository  - Add glycerin suppository x 1  HEMATOLOGIC A:  LLE DVT with subsequent PE P: -cont therapeutic heparin  INFECTIOUS  A: Reported GNR on sputum at OSH, s/p Rocephin x 3 days P: - Restart antibiotics if  develops fever or new infx - hold for now since no fever, low WC & no clear infx  ENDOCRINE  A:  DM P: - Cont Phase 1 ICU Hyperglycemia protocol  NEUROLOGIC A:  Sedation, possible anoxic injury vs uremic encephalopathy P: - no improvement yesterday, worsened off sedation, consider seroquel per NG   Best practices / Disposition: -->ICU status under PCCM -->full code -->Heparin PE treatment -->No GI PPX -->ventilator bundle -->diet - NPO -->sister updated ont he phone 11/18 & consent obtained for HD catheter  Si Raider. Clinton Sawyer, MD, MBA 01/07/2012, 8:52 AM Family Medicine Resident, PGY-2 [phone] pager   I have examined the patient and reviewed the database. I have formulated the assessment and plan as reflected in the note above with amendments made by me. 35 mins of direct critical care time provided. Family members updated. Hopefully can work on weaning AM 11/22  Billy Fischer, MD;  PCCM service; Mobile [phone]

## 2012-01-07 NOTE — Progress Notes (Signed)
1810 NTS performed with moderate amount thin clear secretions obtained.

## 2012-01-07 NOTE — Procedures (Signed)
Extubation Procedure Note  Patient Details:   Name: Noah Torres DOB: 1940/05/14 MRN: [id number]   Airway Documentation:     Evaluation  O2 sats: stable throughout Complications: No apparent complications Patient did tolerate procedure well. Bilateral Breath Sounds: Rhonchi Suctioning: Airway   Leonides Schanz 01/07/2012, 12:43 PM

## 2012-01-07 NOTE — Progress Notes (Signed)
ANTICOAGULATION CONSULT NOTE  Pharmacy Consult for Heparin Indication: pulmonary embolus  Allergies  Allergen Reactions  . Metformin And Related     Patient Measurements: Height: 5\' 10"  (177.8 cm) Weight: 226 lb 10.1 oz (102.8 kg) IBW/kg (Calculated) : 73  Heparin Dosing Weight: 98 kg  Vital Signs: Temp: 96.7 F (35.9 C) (11/20 2000) Temp src: Oral (11/20 2000) BP: 149/91 mmHg (11/20 2339) Pulse Rate: 90  (11/20 2339)  Labs:  Basename 01/06/12 2316 01/06/12 1520 01/06/12 1424 01/06/12 1200 01/06/12 0230 01/06/12 0130 01/05/12 1600 01/05/12 0420 01/04/12 1528  HGB -- -- -- -- -- 9.9* -- 10.2* --  HCT -- -- -- -- -- 29.9* -- 31.7* 31.5*  PLT -- -- -- -- -- 124* -- 142* 171  APTT -- -- -- -- -- -- -- -- --  LABPROT -- -- -- -- 20.4* -- 21.3* 22.0* --  INR -- -- -- -- 1.82* -- 1.93* 2.01* --  HEPARINUNFRC 0.64 -- 0.57 1.20* -- -- -- -- --  CREATININE -- 2.79* -- -- 3.51* -- 4.43* -- --  CKTOTAL -- -- -- -- -- -- -- -- --  CKMB -- -- -- -- -- -- -- -- --  TROPONINI -- -- -- -- -- -- -- -- --    Estimated Creatinine Clearance: 29.6 ml/min (by C-G formula based on Cr of 2.79).  Assessment: 71 y/o male with saddle PE for Heparin  Goal of Therapy:  Heparin level 0.3-0.7 units/ml Monitor platelets by anticoagulation protocol: Yes   Plan:  Continue Heparin at current rate  Follow-up am labs.  Geannie Risen, PharmD, BCPS  01/07/2012 12:00 AM

## 2012-01-07 NOTE — Progress Notes (Addendum)
ANTICOAGULATION CONSULT NOTE  Pharmacy Consult for Heparin Indication: pulmonary embolus  Allergies  Allergen Reactions  . Metformin And Related     Patient Measurements: Height: 5\' 10"  (177.8 cm) Weight: 217 lb 2.5 oz (98.5 kg) IBW/kg (Calculated) : 73  Heparin Dosing Weight: 98 kg  Vital Signs: Temp: 93.8 F (34.3 C) (11/21 0334) Temp src: Oral (11/21 0334) BP: 128/73 mmHg (11/21 0800) Pulse Rate: 93  (11/21 0800)  Labs:  Basename 01/07/12 0500 01/06/12 2316 01/06/12 1520 01/06/12 1424 01/06/12 0230 01/06/12 0130 01/05/12 1600 01/05/12 0420  HGB 10.5* -- -- -- -- 9.9* -- --  HCT 32.4* -- -- -- -- 29.9* -- 31.7*  PLT 110* -- -- -- -- 124* -- 142*  APTT -- -- -- -- -- -- -- --  LABPROT 16.9* -- -- -- 20.4* -- 21.3* --  INR 1.41 -- -- -- 1.82* -- 1.93* --  HEPARINUNFRC 0.72* 0.64 -- 0.57 -- -- -- --  CREATININE 2.24* -- 2.79* -- 3.51* -- -- --  CKTOTAL -- -- -- -- -- -- -- --  CKMB -- -- -- -- -- -- -- --  TROPONINI -- -- -- -- -- -- -- --    Estimated Creatinine Clearance: 36.1 ml/min (by C-G formula based on Cr of 2.24).  Assessment: 71 y/o M on heparin for saddle PE.  Heparin level this am sightly greater than goal.  Spoke with RN, heparin currently infusing via R IJ, HD cath.  Heparin level has been repeatedly attempted and failed peripheral stick due to edema.  Consequently, heparin is being held x 5 min, then the level is being drawn from the L central line.  Will continue this for now until it is practical to attempt to place a peripheral line or obtain a peripheral stick.  Minor hematuria continues, but improved from previous.  Minor oozing still noted from IJ lines.  Goal of Therapy:  Heparin level 0.3-0.7 units/ml Monitor platelets by anticoagulation protocol: Yes   Plan:  - Decrease heparin to 900 units/hr - Check 8h heparin level - Continue daily heparin level and CBC  Garek Schuneman L. Illene Bolus, PharmD, BCPS Clinical Pharmacist Pager:  [phone] Pharmacy: [phone] 01/07/2012 8:55 AM

## 2012-01-07 NOTE — Progress Notes (Signed)
eLink Physician-Brief Progress Note Patient Name: Noah Torres DOB: May 22, 1940 MRN: [id number]  Date of Service  01/07/2012   HPI/Events of Note  2 episodes of profound bradycardia - HR to the 20s and hypotension with initiation of precedex.   eICU Interventions  Plan: D/C precedex Restart propofol PRN fentanyl/versed   Intervention Category Intermediate Interventions: Arrhythmia - evaluation and management;Hypotension - evaluation and management  Elvin Banker 01/07/2012, 12:55 AM

## 2012-01-08 ENCOUNTER — Inpatient Hospital Stay (HOSPITAL_COMMUNITY): Payer: Medicare Other

## 2012-01-08 LAB — HEPATIC FUNCTION PANEL
ALT: 198 U/L — ABNORMAL HIGH (ref 0–53)
Alkaline Phosphatase: 123 U/L — ABNORMAL HIGH (ref 39–117)
Bilirubin, Direct: 0.4 mg/dL — ABNORMAL HIGH (ref 0.0–0.3)
Indirect Bilirubin: 0.3 mg/dL (ref 0.3–0.9)
Total Bilirubin: 0.7 mg/dL (ref 0.3–1.2)

## 2012-01-08 LAB — CBC
Hemoglobin: 10.7 g/dL — ABNORMAL LOW (ref 13.0–17.0)
MCH: 26.4 pg (ref 26.0–34.0)
MCHC: 32.2 g/dL (ref 30.0–36.0)
Platelets: 139 10*3/uL — ABNORMAL LOW (ref 150–400)

## 2012-01-08 LAB — GLUCOSE, CAPILLARY
Glucose-Capillary: 104 mg/dL — ABNORMAL HIGH (ref 70–99)
Glucose-Capillary: 61 mg/dL — ABNORMAL LOW (ref 70–99)
Glucose-Capillary: 76 mg/dL (ref 70–99)
Glucose-Capillary: 97 mg/dL (ref 70–99)
Glucose-Capillary: 99 mg/dL (ref 70–99)

## 2012-01-08 LAB — RENAL FUNCTION PANEL
BUN: 25 mg/dL — ABNORMAL HIGH (ref 6–23)
Glucose, Bld: 86 mg/dL (ref 70–99)
Phosphorus: 2.8 mg/dL (ref 2.3–4.6)
Potassium: 4.6 mEq/L (ref 3.5–5.1)

## 2012-01-08 LAB — HEPARIN LEVEL (UNFRACTIONATED): Heparin Unfractionated: 0.41 IU/mL (ref 0.30–0.70)

## 2012-01-08 LAB — MAGNESIUM: Magnesium: 2.6 mg/dL — ABNORMAL HIGH (ref 1.5–2.5)

## 2012-01-08 LAB — PROTIME-INR: Prothrombin Time: 16.3 seconds — ABNORMAL HIGH (ref 11.6–15.2)

## 2012-01-08 MED ORDER — WARFARIN - PHARMACIST DOSING INPATIENT
Freq: Every day | Status: DC
  Filled 2012-01-08 (×2): qty 1

## 2012-01-08 MED ORDER — DEXTROSE 50 % IV SOLN: 25.0000 mL | Freq: Once | INTRAVENOUS | Status: AC | PRN

## 2012-01-08 MED ORDER — DEXTROSE 50 % IV SOLN
INTRAVENOUS | Status: AC
  Administered 2012-01-08: 50 mL
  Filled 2012-01-08: qty 50

## 2012-01-08 MED ORDER — DEXTROSE 50 % IV SOLN: 50.0000 mL | Freq: Once | INTRAVENOUS | Status: AC | PRN

## 2012-01-08 MED ORDER — ALBUTEROL SULFATE (5 MG/ML) 0.5% IN NEBU
2.5000 mg | INHALATION_SOLUTION | RESPIRATORY_TRACT | Status: DC
  Administered 2012-01-08 – 2012-01-11 (×17): 2.5 mg via RESPIRATORY_TRACT
  Filled 2012-01-08 (×16): qty 0.5

## 2012-01-08 MED ORDER — WARFARIN SODIUM 5 MG IV SOLR
4.0000 mg | Freq: Once | INTRAVENOUS | Status: AC
  Administered 2012-01-08: 4 mg via INTRAVENOUS
  Filled 2012-01-08: qty 2

## 2012-01-08 NOTE — Progress Notes (Signed)
Name: Noah Torres MRN: [id number] DOB: 1940/04/19    LOS: 4  PCCM ADMISSION NOTE  History of Present Illness: 71 year old M with PMH of CKD (baseline Cr 1.9) and DM who presented to [facility] on 11/11 with dyspnea. He was found to have a lower extremity DVT and saddle PE with RV strain. Subsequently, he developed ARF, acidosis and shock. He was intubated on 11/13 and shortly thereafter had a cardiac arrest. He was given TPA and resuscitated. He was transferred to [facility] for evaluation and treatment of acute renal failure.   Lines / Drains: ETT >>>11/21 Rt HD Cath 11/18>>>  Cultures: Sputum 11/14>>> GNR (per outside) note Trach Aspirate 11/18>>> moderate pseudomonas  Antibiotics: Ceftriaxone 11/16>>>11/18  Tests / Events: 11/11 - Saddle PE noted on CT-A, started anticoagulation 11/12 - Echo shows PA pressure 58-60; Doppler shows LLE DVT 11/13 - Develops shock started on pressors, develops resp failure intubated, cardiac arrest - given TPA and resuscitated 11/14 - AKI (Creat increa 1.5-->2.7) 11/16 - GRN in sputum, concern for BLL PNA, start on rocephin 11/17 - stopped all pressors 11/18 - transferred to [facility] and started on CRRT  11/20 - Multiple episodes of bradycardia/hypotension 22/ precedex use, so med stopped 11/21 - Successfully extubated, no improvement in mental status  Overnight: Patient able to protect airway after extubation yesterday, mentation has not improved, neg 5 lit  Vital Signs: Temp:  [97.3 F (36.3 C)-99.3 F (37.4 C)] 98.9 F (37.2 C) (11/22 0740) Pulse Rate:  [94-113] 113  (11/22 0700) Resp:  [17-30] 25  (11/22 0700) BP: (118-166)/(67-99) 144/77 mmHg (11/22 0700) SpO2:  [95 %-100 %] 100 % (11/22 0700) FiO2 (%):  [30 %] 30 % (11/21 1200) Weight:  [200 lb 2.8 oz (90.8 kg)] 200 lb 2.8 oz (90.8 kg) (11/22 0500) I/O last 3 completed shifts: In: 1582.3 [I.V.:692.3; NG/GT:880; IV Piggyback:10] Out: 8945 [Urine:1135; Other:7810]   Intake/Output  Summary (Last 24 hours) at 01/08/12 0829 Last data filed at 01/08/12 0800  Gross per 24 hour  Intake 696.25 ml  Output   5621 ml  Net -4924.75 ml   Net -16L since admission   Physical Examination: General:  Extubated, awake but not interactive, markedly less edematous Neuro:  Sedated, does not follow other commands; withdraws to painful stimulus HEENT:  NCAT, PERRLA Neck:  Normal  Cardiovascular:  RRR, no M,R,G Lungs: upper airway congestion, mildly increased WOB, stridor  Abdomen:  Anasarca improving, NDNT, hypoactive bowel sounds  Musculoskeletal:  1+ edema upper extremities, 1+ edema lower extremities  Skin: intact, no rashes or ecchymosis  Genital: scrotal and penile swelling almost completely resolved   Ventilator settings: Vent Mode:  [-]  FiO2 (%):  [30 %] 30 %   Labs and Imaging:  Reviewed.  Please refer to the Assessment and Plan section for relevant results.   ASSESSMENT AND PLAN  PULMONARY A:  Acute Respiratory Failure 2/2 saddle PE     ? Aspiration  Pneumonia per outside hospital      Pulm Edema  P: Maintain O2 sats on nasal cannula Cont Heparin for PE Continue neg balance likely, may need diuresis  CARDIOVASCULAR No results found for this basename: TROPONINI:5,LATICACIDVEN:5, O2SATVEN:5,PROBNP:5 in the last 168 hours A:   Tachycardia, Sinus P: - Start metoprolol for HTN and tachy -will need echo rv re assessment  RENAL A:  AKI, contrast induced followed by ATN P: - Stop CRRT today per Renal, trend UOP Goal neg 1 liter, assess own efforts pre lasix Chem  in am   GASTROINTESTINAL A:  Severe Protein Malnutrition       Constipation P: - Continue tube feeds - Cont Colace daily and PRN dulcolax suppository  SLP when improved neuro  HEMATOLOGIC A:  LLE DVT with subsequent PE P: -cont heparin ggt -add coumadin overlap follow hct in am   INFECTIOUS  A: Reported GNR on sputum at OSH, s/p Rocephin x 3 days P: - Restart antibiotics if develops  fever or new infx - hold for now since no fever, low WC & no clear infx Would need ceftaz  ENDOCRINE  A:  DM P: - Cont Phase 1 ICU Hyperglycemia protocol  NEUROLOGIC A:  Sedation, possible anoxic injury vs uremic encephalopathy vs/ stroke P: - No recovery of mental status, obtain CT today to assess for indolent bleed since received TPA  Best practices / Disposition: -->ICU status under PCCM -->full code -->Heparin PE treatment -->No GI PPX -->ventilator bundle -->diet - NPO -->no fam available  Global Statement: With improving kidney and respiratory status, the biggest question remaining is his brain recovery. Otherwise, appropriate for transfer out of ICU. For CT head  Si Raider. Clinton Sawyer, MD, MBA 01/08/2012, 8:29 AM Family Medicine Resident, PGY-2 [phone] pager   I have fully examined this patient and agree with above findings.    And edited infull.Mcarthur Rossetti. Tyson Alias, MD, FACP Pgr: [phone] [facility]

## 2012-01-08 NOTE — Progress Notes (Signed)
Hypoglycemic Event  CBG: 61  Treatment: D50 IV 50 mL  Symptoms: None  Follow-up CBG: Time:08:30PM CBG Result:104  Possible Reasons for Event: Inadequate meal intake  Comments/MD notified:Pt's sugar came up WNL    Geofrey Silliman  Remember to initiate Hypoglycemia Order Set & complete

## 2012-01-08 NOTE — Progress Notes (Signed)
Nutrition Follow-up  Intervention:  Consider swallow evaluation with SLP to determine safest diet.  If unable to advance diet, recommend replace enteral feeding tube and resuming TF.  Assessment:   Patient was extubated yesterday.  Remains NPO.  TF has been off since extubation.  CRRT being discontinued today.  May need intermittent HD, Nephrology following.  Diet Order:  NPO  Meds: Scheduled Meds:   . albuterol  2.5 mg Nebulization Q4H  . antiseptic oral rinse  15 mL Mouth Rinse QID  . chlorhexidine  15 mL Mouth Rinse BID  . insulin aspart  2-6 Units Subcutaneous Q4H  . pantoprazole (PROTONIX) IV  40 mg Intravenous Q24H  . [DISCONTINUED] docusate  100 mg Per Tube Daily  . [DISCONTINUED] feeding supplement (OSMOLITE 1.2 CAL)  1,000 mL Per Tube Q24H  . [DISCONTINUED] feeding supplement  60 mL Per Tube TID  . [DISCONTINUED] pantoprazole sodium  40 mg Per Tube QHS   Continuous Infusions:   . heparin 900 Units/hr (01/07/12 1225)  . [DISCONTINUED] dialysis replacement fluid (prismasate) 400 mL/hr at 01/08/12 0123  . [DISCONTINUED] dialysis replacement fluid (prismasate) 300 mL/hr at 01/07/12 1821  . [DISCONTINUED] dialysate (PRISMASATE) 1,500 mL/hr at 01/08/12 0454  . [DISCONTINUED] propofol Stopped (01/07/12 1230)   PRN Meds:.sodium chloride, sodium chloride, bisacodyl, fentaNYL, heparin, hydrALAZINE, metoprolol, [DISCONTINUED] albuterol, [DISCONTINUED] fentaNYL, [DISCONTINUED] heparin, [DISCONTINUED] midazolam   CMP     Component Value Date/Time   NA 135 01/08/2012 0439   K 4.6 01/08/2012 0439   CL 98 01/08/2012 0439   CO2 25 01/08/2012 0439   GLUCOSE 86 01/08/2012 0439   BUN 25* 01/08/2012 0439   CREATININE 2.19* 01/08/2012 0439   CALCIUM 8.8 01/08/2012 0439   PROT 5.7* 01/04/2012 1528   ALBUMIN 2.6* 01/08/2012 0439   AST 155* 01/04/2012 1528   ALT 424* 01/04/2012 1528   ALKPHOS 135* 01/04/2012 1528   BILITOT 1.1 01/04/2012 1528   GFRNONAA 29* 01/08/2012 0439   GFRAA 33* 01/08/2012 0439    CBG (last 3)   Basename 01/08/12 0738 01/08/12 0346 01/08/12 0051  GLUCAP 87 97 99     Intake/Output Summary (Last 24 hours) at 01/08/12 1155 Last data filed at 01/08/12 1000  Gross per 24 hour  Intake 433.25 ml  Output   4843 ml  Net -4409.75 ml    Weight Status:  90.8 kg down with negative fluid balance; BMI=28.7  Re-estimated needs:  2050-2250 kcals, 95-105 gm protein, 2.1-2.3 liters fluid daily  Nutrition Dx:  Inadequate oral intake related to inability to eat as evidenced by NPO status, ongoing.  Goal:  Enteral nutrition to provide 60-70% of estimated calorie needs (22-25 kcals/kg ideal body weight) and >/= 90% of estimated protein needs, based on ASPEN guidelines for permissive underfeeding in critically ill obese individuals, no longer applicable.  New Goal:  Intake to meet >90% of estimated nutrition needs.  Monitor:  Ability to advance PO diet vs need to resume TF, adequacy of intake, labs, weight trend.   Joaquin Courts, RD, LDN, CNSC Pager# [phone] After Hours Pager# [phone]

## 2012-01-08 NOTE — Progress Notes (Signed)
ANTICOAGULATION CONSULT NOTE  Pharmacy Consult for Heparin and Coumadin Indication: pulmonary embolus  Allergies  Allergen Reactions  . Metformin And Related    Patient Measurements: Height: 5\' 10"  (177.8 cm) Weight: 200 lb 2.8 oz (90.8 kg) IBW/kg (Calculated) : 73  Heparin Dosing Weight: 98 kg  Vital Signs: Temp: 98.8 F (37.1 C) (11/22 1208) Temp src: Oral (11/22 1208) BP: 147/98 mmHg (11/22 1200) Pulse Rate: 93  (11/22 1300)  Labs:  Basename 01/08/12 0439 01/07/12 1625 01/07/12 0500 01/06/12 1520 01/06/12 0230 01/06/12 0130  HGB 10.7* -- 10.5* -- -- --  HCT 33.2* -- 32.4* -- -- 29.9*  PLT 139* -- 110* -- -- 124*  APTT -- -- -- -- -- --  LABPROT 16.3* -- 16.9* -- 20.4* --  INR 1.34 -- 1.41 -- 1.82* --  HEPARINUNFRC 0.41 0.70 0.72* -- -- --  CREATININE 2.19* -- 2.24* 2.79* -- --  CKTOTAL -- -- -- -- -- --  CKMB -- -- -- -- -- --  TROPONINI -- -- -- -- -- --    Estimated Creatinine Clearance: 35.6 ml/min (by C-G formula based on Cr of 2.19).  Assessment: 71 y/o M on heparin for saddle PE.  Heparin level this evening is at goal. INR 1.34 today. LFTs trending down. Starting IV Coumadin per CCM.   Spoke with RN, heparin currently infusing via R IJ, HD cath.  Heparin level has been repeatedly attempted and failed peripheral stick due to edema.  Consequently, heparin is being held x 5 min, then the level is being drawn from the L central line.  Will continue this for now until it is practical to attempt to place a peripheral line or obtain a peripheral stick.  Minor hematuria continues, but improved from previous.  Minor oozing from foley, no longer from lines.   Goal of Therapy:  Heparin level 0.3-0.7 units/ml Monitor platelets by anticoagulation protocol: Yes   Plan:  - Continue heparin to 900 units/hr - Continue daily heparin level and CBC - Coumadin 4mg  IV x1 at 1800. - Daily PT/INR  - Unable to educate at this time due to patient's mental status  Link Snuffer, PharmD, BCPS Clinical Pharmacist [phone] 01/08/2012 2:02 PM

## 2012-01-08 NOTE — Progress Notes (Signed)
Patient ID: Linford Quintela, male   DOB: Oct 31, 1940, 71 y.o.   MRN: [id number]   [facility] Progress Note    Subjective:   Successfully extubated, uneventful night noted. Continues to tolerate CRRT without problems and bloody urine noted    Objective:   BP 144/77  Pulse 113  Temp 99.3 F (37.4 C) (Oral)  Resp 25  Ht 5\' 10"  (1.778 m)  Wt 90.8 kg (200 lb 2.8 oz)  BMI 28.72 kg/m2  SpO2 100%  Intake/Output Summary (Last 24 hours) at 01/08/12 0732 Last data filed at 01/08/12 0700  Gross per 24 hour  Intake  732.7 ml  Output   5645 ml  Net -4912.3 ml   Weight change: -7.7 kg (-16 lb 15.6 oz)  Physical Exam: Gen: Comfortable resting in bed, somnolent-awakens to calling out his name CVS: Pulse regular tachycardia, heart sounds S1 normal with loud S2 Resp: Coarse breath sounds bilaterally, no rales Abd: Soft, flat, nontender and bowel sounds are normal Ext: One plus edema of the upper and lower extremities  Imaging: Dg Chest Port 1 View  01/07/2012  *RADIOLOGY REPORT*  Clinical Data: Assess endotracheal tube.  PORTABLE CHEST - 1 VIEW  Comparison: 01/06/2012  Findings: Endotracheal tube is 2.6 cm above the carina.  Right jugular central venous catheter in the upper SVC region. Persistent bibasilar densities may represent consolidation and pleural fluid.  Prominent interstitial markings are suggestive for mild edema.  Heart size is stable.  No evidence for a pneumothorax. Left jugular central venous catheter tip is near the junction of the left innominate vein and SVC. Nasogastric tube extends into the abdomen.  IMPRESSION: Stable chest radiograph findings.  Suspect pulmonary edema and pleural effusions.  Stable position of support apparatuses.   Original Report Authenticated By: Richarda Overlie, M.D.     Labs: BMET  Lab 01/08/12 0439 01/07/12 0500 01/06/12 1520 01/06/12 0230 01/05/12 1600 01/05/12 0420 01/04/12 1528  NA 135 137 138 137 136 138 140  K 4.6 4.4 4.0 3.8  4.2 4.3 4.3  CL 98 101 102 102 101 104 104  CO2 25 27 26 26 24 24 19   GLUCOSE 86 190* 178* 123* 140* 79 126*  BUN 25* 30* 31* 36* 40* 50* 68*  CREATININE 2.19* 2.24* 2.79* 3.51* 4.43* 5.38* 7.29*  ALB -- -- -- -- -- -- --  CALCIUM 8.8 8.3* 8.0* 7.8* 7.5* 7.1* 6.8*  PHOS 2.8 3.3 3.8 4.1 3.7 4.7* 5.7*   CBC  Lab 01/08/12 0439 01/07/12 0500 01/06/12 0130 01/05/12 0420  WBC 6.3 4.5 3.7* 3.6*  NEUTROABS -- -- -- --  HGB 10.7* 10.5* 9.9* 10.2*  HCT 33.2* 32.4* 29.9* 31.7*  MCV 81.8 80.8 79.5 80.7  PLT 139* 110* 124* 142*    Medications:      . antiseptic oral rinse  15 mL Mouth Rinse QID  . chlorhexidine  15 mL Mouth Rinse BID  . [COMPLETED] Glycerin (Adult)  1 suppository Rectal Once  . insulin aspart  2-6 Units Subcutaneous Q4H  . pantoprazole (PROTONIX) IV  40 mg Intravenous Q24H  . [COMPLETED] white petrolatum      . [DISCONTINUED] docusate  100 mg Per Tube Daily  . [DISCONTINUED] feeding supplement (OSMOLITE 1.2 CAL)  1,000 mL Per Tube Q24H  . [DISCONTINUED] feeding supplement  60 mL Per Tube TID  . [DISCONTINUED] pantoprazole sodium  40 mg Per Tube QHS     Assessment/ Plan:   1. Acute renal failure: timeline of events appear to  favor ischemic ATN with cardiorespiratory arrest/renal hypoperfusion injury. Urine output 800 cc of urine over the past 24 hours. We'll discontinue CRRT today-from records available, he appears to be -16 L since admission, still has some edema however does not appear to be compromising his respiratory status. We'll follow him closely as we assess for renal recovery and blood pressures appear to indicates that he may be able to tolerate intermittent dialysis if needed to restart. Flush Foley catheter suspecting bleed while on heparin drip. 2. Saddle pulmonary embolus: hemodynamically stable and on anti-coagulation with heparin per pharmacy.  3. Elevated LFTs: Likely shock liver- monitor with improving BP  4. VDRF s/p cardiorespiratory arrest: Remains  intubated and on ventilator support, will continue ultrafiltration to assist with weaning.   Zetta Bills, MD 01/08/2012, 7:32 AM

## 2012-01-08 NOTE — Progress Notes (Signed)
[facility] ADULT ICU REPLACEMENT PROTOCOL FOR AM LAB REPLACEMENT ONLY  The patient does not apply for the [facility] Adult ICU Electrolyte Replacment Protocol based on the criteria listed below:  Pt on C V V H 6. If a panic level lab has been reported, has the CCM MD in charge been notified? yes.   Physician:  Fatima Sanger, Lelon Perla 01/08/2012 5:35 AM

## 2012-01-09 ENCOUNTER — Encounter (HOSPITAL_COMMUNITY): Payer: Self-pay | Admitting: General Practice

## 2012-01-09 DIAGNOSIS — I469 Cardiac arrest, cause unspecified: Secondary | ICD-10-CM

## 2012-01-09 DIAGNOSIS — G9340 Encephalopathy, unspecified: Secondary | ICD-10-CM

## 2012-01-09 DIAGNOSIS — G912 (Idiopathic) normal pressure hydrocephalus: Secondary | ICD-10-CM

## 2012-01-09 LAB — COMPREHENSIVE METABOLIC PANEL
ALT: 165 U/L — ABNORMAL HIGH (ref 0–53)
Alkaline Phosphatase: 118 U/L — ABNORMAL HIGH (ref 39–117)
BUN: 38 mg/dL — ABNORMAL HIGH (ref 6–23)
CO2: 24 mEq/L (ref 19–32)
Chloride: 105 mEq/L (ref 96–112)
GFR calc Af Amer: 15 mL/min — ABNORMAL LOW (ref >90)
GFR calc non Af Amer: 13 mL/min — ABNORMAL LOW (ref >90)
Glucose, Bld: 84 mg/dL (ref 70–99)
Potassium: 4 mEq/L (ref 3.5–5.1)
Sodium: 141 mEq/L (ref 135–145)
Total Bilirubin: 0.8 mg/dL (ref 0.3–1.2)

## 2012-01-09 LAB — CBC
MCH: 26.2 pg (ref 26.0–34.0)
MCHC: 32.2 g/dL (ref 30.0–36.0)
Platelets: 157 10*3/uL (ref 150–400)
RBC: 3.7 MIL/uL — ABNORMAL LOW (ref 4.22–5.81)
RDW: 18.5 % — ABNORMAL HIGH (ref 11.5–15.5)

## 2012-01-09 LAB — PROTIME-INR: Prothrombin Time: 16.4 seconds — ABNORMAL HIGH (ref 11.6–15.2)

## 2012-01-09 LAB — HEPARIN LEVEL (UNFRACTIONATED): Heparin Unfractionated: 0.37 IU/mL (ref 0.30–0.70)

## 2012-01-09 LAB — GLUCOSE, CAPILLARY
Glucose-Capillary: 105 mg/dL — ABNORMAL HIGH (ref 70–99)
Glucose-Capillary: 76 mg/dL (ref 70–99)

## 2012-01-09 MED ORDER — OSMOLITE 1.2 CAL PO LIQD
1000.0000 mL | ORAL | Status: DC
  Administered 2012-01-09 – 2012-01-12 (×3): 1000 mL
  Filled 2012-01-09 (×9): qty 1000

## 2012-01-09 MED ORDER — OXEPA PO LIQD
1000.0000 mL | ORAL | Status: DC
  Administered 2012-01-09: 1000 mL
  Filled 2012-01-09 (×4): qty 1000

## 2012-01-09 MED ORDER — WARFARIN SODIUM 4 MG PO TABS
4.0000 mg | ORAL_TABLET | Freq: Once | ORAL | Status: AC
  Administered 2012-01-09: 4 mg via NASOGASTRIC
  Filled 2012-01-09 (×2): qty 1

## 2012-01-09 NOTE — Progress Notes (Signed)
  Echocardiogram 2D Echocardiogram has been performed.  Georgian Co 01/09/2012, 8:48 AM

## 2012-01-09 NOTE — Progress Notes (Signed)
Patient ID: Noah Torres, male   DOB: [birth year], 71 y.o.   MRN: [id number]   [facility] Progress Note    Subjective:   CRRT discontinued yesterday and improving UOP noted overnight. Hypoglycemic last night and NG tube placed in anticipation of starting TFs later today   Objective:   BP 143/82  Pulse 105  Temp 99 F (37.2 C) (Oral)  Resp 17  Ht 5\' 10"  (1.778 m)  Wt 91.4 kg (201 lb 8 oz)  BMI 28.91 kg/m2  SpO2 95%  Intake/Output Summary (Last 24 hours) at 01/09/12 0743 Last data filed at 01/09/12 0700  Gross per 24 hour  Intake    476 ml  Output   1883 ml  Net  -1407 ml   Weight change: 0.6 kg (1 lb 5.2 oz)  Physical Exam: WUJ:[id number] in bed- awakens to calling his name but no verbal response NFA:[id number] RRR, S1 and S2 normal Resp:Coarse BS bilaterally, no rales/rhonchi MVH:QION, obese, NT, BS normal Ext:1+ LE edema  Imaging: Ct Head Wo Contrast  01/08/2012  *RADIOLOGY REPORT*  Clinical Data: Altered mental status.  TPA for pulmonary embolism.  CT HEAD WITHOUT CONTRAST  Technique:  Contiguous axial images were obtained from the base of the skull through the vertex without contrast.  Comparison: None.  Findings: Ventricular enlargement out of proportion to the cortical atrophy raises question of normal pressure hydrocephalus.  No acute stroke or bleed.  No extra-axial fluid collection.  Calvarium intact.  Vascular calcification.  Clear sinuses and mastoids. Right cataract extraction.  IMPRESSION: Ventricular enlargement out of portion to cortical atrophy. Question normal pressure hydrocephalus.  No acute stroke or bleed.   Original Report Authenticated By: Davonna Belling, M.D.     Labs: BMET  Lab 01/09/12 0600 01/08/12 0439 01/07/12 0500 01/06/12 1520 01/06/12 0230 01/05/12 1600 01/05/12 0420 01/04/12 1528  NA 141 135 137 138 137 136 138 --  K 4.0 4.6 4.4 4.0 3.8 4.2 4.3 --  CL 105 98 101 102 102 101 104 --  CO2 24 25 27 26 26 24 24  --  GLUCOSE 84 86  190* 178* 123* 140* 79 --  BUN 38* 25* 30* 31* 36* 40* 50* --  CREATININE 4.24* 2.19* 2.24* 2.79* 3.51* 4.43* 5.38* --  ALB -- -- -- -- -- -- -- --  CALCIUM 8.8 8.8 8.3* 8.0* 7.8* 7.5* 7.1* --  PHOS -- 2.8 3.3 3.8 4.1 3.7 4.7* 5.7*   CBC  Lab 01/09/12 0453 01/08/12 0439 01/07/12 0500 01/06/12 0130  WBC 6.4 6.3 4.5 3.7*  NEUTROABS -- -- -- --  HGB 9.7* 10.7* 10.5* 9.9*  HCT 30.1* 33.2* 32.4* 29.9*  MCV 81.4 81.8 80.8 79.5  PLT 157 139* 110* 124*    Medications:      . albuterol  2.5 mg Nebulization Q4H  . antiseptic oral rinse  15 mL Mouth Rinse QID  . chlorhexidine  15 mL Mouth Rinse BID  . [COMPLETED] dextrose      . insulin aspart  2-6 Units Subcutaneous Q4H  . pantoprazole (PROTONIX) IV  40 mg Intravenous Q24H  . Warfarin - Pharmacist Dosing Inpatient   Does not apply q1800  . [COMPLETED] warfarin IV  4 mg Intravenous ONCE-1800     Assessment/ Plan:   1. Acute renal failure: timeline of events appear to favor ischemic ATN with cardiorespiratory arrest/renal hypoperfusion injury. Urine output 1680 cc of urine over the past 24 hours. Renal clearance remains poor but no indications for dialysis noted- anticipate  renal recovery will follow his improving UOP.  2. Saddle pulmonary embolus: hemodynamically stable and on anti-coagulation with heparin per pharmacy.  3. Elevated LFTs: Likely shock liver- monitor with improving BP  4. VDRF s/p cardiorespiratory arrest: Remains intubated and on ventilator support, will continue ultrafiltration to assist with weaning.   Zetta Bills, MD 01/09/2012, 7:43 AM

## 2012-01-09 NOTE — Progress Notes (Addendum)
Name: Noah Torres MRN: [id number] DOB: May 21, 1940    LOS: 5  PCCM ADMISSION NOTE  History of Present Illness: 71 year old M with PMH of CKD (baseline Cr 1.9) and DM who presented to [facility] on 11/11 with dyspnea. He was found to have a lower extremity DVT and saddle PE with RV strain. Subsequently, he developed ARF, acidosis and shock. He was intubated on 11/13 and shortly thereafter had a cardiac arrest. He was given TPA and resuscitated. He was transferred to [facility] for evaluation and treatment of acute renal failure.   Lines / Drains: ETT >>>11/21 Rt HD Cath 11/18>>> Lft IJ 11/18>>>11/23  Cultures: Sputum 11/14>>> GNR (per outside) note Trach Aspirate 11/18>>> moderate pseudomonas (pansensitive)  Antibiotics: Ceftriaxone 11/16>>>11/18  Tests / Events: 11/11 - Saddle PE noted on CT-A, started anticoagulation 11/12 - Echo shows PA pressure 58-60; Doppler shows LLE DVT 11/13 - Develops shock started on pressors, develops resp failure intubated, cardiac arrest - given TPA and resuscitated 11/14 - AKI (Creat increa 1.5-->2.7) 11/16 - GRN in sputum, concern for BLL PNA, start on rocephin 11/17 - stopped all pressors 11/18 - transferred to [facility] and started on CRRT  11/20 - Multiple episodes of bradycardia/hypotension 22/ precedex use, so med stopped 11/21 - Successfully extubated, no improvement in mental status 11/22 - CT shows no stroke, possible normal pressure hydrocephalus   Overnight: no acute events overnight, remains encephalopathic this morning  Vital Signs: Temp:  [98.7 F (37.1 C)-99.7 F (37.6 C)] 99 F (37.2 C) (11/23 0042) Pulse Rate:  [93-115] 109  (11/23 0500) Resp:  [17-37] 23  (11/23 0500) BP: (115-173)/(67-100) 150/82 mmHg (11/23 0500) SpO2:  [87 %-100 %] 95 % (11/23 0500) Weight:  [201 lb 8 oz (91.4 kg)] 201 lb 8 oz (91.4 kg) (11/23 0500) I/O last 3 completed shifts: In: 860.7 [I.V.:540.7; Other:10; NG/GT:300; IV Piggyback:10] Out: 6353  [Urine:1360; Other:4993]   Intake/Output Summary (Last 24 hours) at 01/09/12 0537 Last data filed at 01/09/12 0500  Gross per 24 hour  Intake    456 ml  Output   2255 ml  Net  -1799 ml   Net -16L since admission   Physical Examination: General:  Extubated, awake but not interactive, markedly less edematous Neuro:  Sedated, does not follow other commands; withdraws to painful stimulus HEENT:  NCAT, PERRLA Neck:  Normal  Cardiovascular:  RRR, no M,R,G Lungs: upper airway congestion, mildly increased WOB, stridor  Abdomen:  Anasarca improving, NDNT, hypoactive bowel sounds  Musculoskeletal:  1+ edema upper extremities, 1+ edema lower extremities  Skin: intact, no rashes or ecchymosis  Genital: scrotal and penile swelling almost completely resolved   Ventilator settings:     Labs and Imaging:  Reviewed.  Please refer to the Assessment and Plan section for relevant results.  Principal Problem:  *Cardiac arrest Active Problems:  Acute respiratory failure  PE (pulmonary thromboembolism)  Acute renal failure  Encephalopathy  Normal pressure hydrocephalus    ASSESSMENT AND PLAN  PULMONARY A:  Acute Respiratory Failure 2/2 saddle PE      Pulm Edema, resolved  P: Maintain O2 sats on nasal cannula Cont anticoagulation Continue neg balance   CARDIOVASCULAR No results found for this basename: TROPONINI:5,LATICACIDVEN:5, O2SATVEN:5,PROBNP:5 in the last 168 hours A:  S/p PEA arrest at OSH        Tachycardia, Sinus      HTN P: - Cont metoprolol PRN for tachy - Cont hydralazine PRN HTN - Order Echo  RENAL A:  AKI,  contrast induced followed by ATN P: - good auto-diuresis yesterday  GASTROINTESTINAL A:  Severe Protein Malnutrition       Constipation P: - Restart tube feeds, place NG - Cont Colace daily and PRN dulcolax suppository  - SLP eval when when following commands  HEMATOLOGIC A:  LLE DVT with subsequent PE P: - bridge to coumadin - follow CBC    INFECTIOUS  A: Reported GNR on sputum at OSH, s/p Rocephin x 3 days P: - Restart antibiotics if develops fever or new infx - hold for now since no fever, low WC & no clear infx Would need ceftaz  ENDOCRINE  A:  DM      Hypoglycemia P: - Hold insulin until tube feeds restarted  NEUROLOGIC A:  Encephalopathic - possible anoxic injury vs NPH P: - CT neg for stroke  - Consider neuro consult for encephalopathy  Best practices / Disposition: -->ICU status under PCCM -->full code -->Heparin PE treatment -->No GI PPX -->ventilator bundle -->diet - restart tube feeds  -->no fam available  Global Statement: With improving kidney and respiratory status, the biggest question remaining is his brain recovery since no reversible cause has been found.  Otherwise, appropriate for transfer out of ICU.   Si Raider Clinton Sawyer, MD, MBA 01/09/2012, 5:37 AM Family Medicine Resident, PGY-2 [phone] pager  Attending:  I have seen and examined the patient with nurse practitioner/resident and agree with the note above.   Mental status remains a puzzle.  Large ventricles on CT head, NPH.  Will ask neurology.    OK to transfer to SDU.  Yolonda Kida PCCM Pager: [phone] Cell: [phone] If no response, call [phone]

## 2012-01-09 NOTE — Progress Notes (Signed)
ANTICOAGULATION CONSULT NOTE  Pharmacy Consult for Heparin and Coumadin Indication: pulmonary embolus  Allergies  Allergen Reactions  . Metformin And Related    Patient Measurements: Height: 5\' 10"  (177.8 cm) Weight: 201 lb 8 oz (91.4 kg) IBW/kg (Calculated) : 73  Heparin Dosing Weight: 98 kg  Vital Signs: Temp: 99.1 F (37.3 C) (11/23 0802) Temp src: Oral (11/23 0802) BP: 150/84 mmHg (11/23 1000) Pulse Rate: 115  (11/23 1000)  Labs:  Basename 01/09/12 0600 01/09/12 0453 01/08/12 0439 01/07/12 1625 01/07/12 0500  HGB -- 9.7* 10.7* -- --  HCT -- 30.1* 33.2* -- 32.4*  PLT -- 157 139* -- 110*  APTT -- -- -- -- --  LABPROT -- 16.4* 16.3* -- 16.9*  INR -- 1.35 1.34 -- 1.41  HEPARINUNFRC -- 0.37 0.41 0.70 --  CREATININE 4.24* -- 2.19* -- 2.24*  CKTOTAL -- -- -- -- --  CKMB -- -- -- -- --  TROPONINI -- -- -- -- --    Estimated Creatinine Clearance: 18.4 ml/min (by C-G formula based on Cr of 4.24).  Assessment: 71 y/o M on heparin for saddle PE.  Heparin level this evening is at goal. INR 1.34 today. LFTs trending down. Starting IV Coumadin per CCM.   Minor hematuria continues, but improved from previous.  Minor oozing from foley, no longer from lines. Spoke with RN,able to draw heparin level as peripheral sticks now.  NG tube placed this am, so will change coumadin dosing to per tube.  Goal of Therapy:  Heparin level 0.3-0.7 units/ml Monitor platelets by anticoagulation protocol: Yes   Plan:  - Continue heparin at 900 units/hr - Continue daily heparin level and CBC - Coumadin 4mg  po x1 at 1800. - Daily PT/INR  - Unable to educate at this time due to patient's mental status  Jill Side L. Illene Bolus, PharmD, BCPS Clinical Pharmacist Pager: [phone] Pharmacy: [phone] 01/09/2012 10:59 AM

## 2012-01-09 NOTE — Progress Notes (Signed)
Nutrition Follow-up/Consult received to initiate and manage enteral nutrition support.   Intervention: 1. D/C Oxepa 2. Initiate Osmolite 1.2 @ 25 ml/hr via NG tube and increase by 10 ml every 4 hours to goal rate of 75 ml/hr. At goal rate, tube feeding regimen will provide 2160 kcal (>100% of minimum needs), 100 grams of protein (>100% of minimum needs), and 1458 ml of H2O.    Assessment:   Patient was extubated 11/21.  Remains NPO.  TF has been off since extubation.  CRRT discontinued 11/22.  May need intermittent HD, Nephrology following. Plan for SLP eval once following commands. Neurology consulted.  Patient has NG tube in place. Oxpea ordered to start.  Free water flushes: NA  Total free water: 1458 ml per day.  Last bm: 11/21  Diet Order:  NPO  Meds: Scheduled Meds:    . albuterol  2.5 mg Nebulization Q4H  . antiseptic oral rinse  15 mL Mouth Rinse QID  . chlorhexidine  15 mL Mouth Rinse BID  . [COMPLETED] dextrose      . insulin aspart  2-6 Units Subcutaneous Q4H  . pantoprazole (PROTONIX) IV  40 mg Intravenous Q24H  . Warfarin - Pharmacist Dosing Inpatient   Does not apply q1800  . [COMPLETED] warfarin IV  4 mg Intravenous ONCE-1800   Continuous Infusions:    . feeding supplement (OXEPA) 1,000 mL (01/09/12 0800)  . heparin 900 Units/hr (01/07/12 1225)   PRN Meds:.sodium chloride, sodium chloride, bisacodyl, [EXPIRED] dextrose, [EXPIRED] dextrose, fentaNYL, heparin, hydrALAZINE, metoprolol   CMP     Component Value Date/Time   NA 141 01/09/2012 0600   K 4.0 01/09/2012 0600   CL 105 01/09/2012 0600   CO2 24 01/09/2012 0600   GLUCOSE 84 01/09/2012 0600   BUN 38* 01/09/2012 0600   CREATININE 4.24* 01/09/2012 0600   CALCIUM 8.8 01/09/2012 0600   PROT 6.8 01/09/2012 0600   ALBUMIN 2.4* 01/09/2012 0600   AST 67* 01/09/2012 0600   ALT 165* 01/09/2012 0600   ALKPHOS 118* 01/09/2012 0600   BILITOT 0.8 01/09/2012 0600   GFRNONAA 13* 01/09/2012 0600   GFRAA 15*  01/09/2012 0600    CBG (last 3)   Basename 01/09/12 0747 01/09/12 0444 01/09/12 0040  GLUCAP 86 78 76     Intake/Output Summary (Last 24 hours) at 01/09/12 0926 Last data filed at 01/09/12 0800  Gross per 24 hour  Intake    467 ml  Output   1725 ml  Net  -1258 ml    Weight Status:  113.8 kg on admission due to fluid 91.4 kg 11/23 90.8 kg 11/22  Re-estimated needs:  2050-2250 kcals, 95-105 gm protein, 2.1-2.3 liters fluid daily  Nutrition Dx:  Inadequate oral intake related to inability to eat as evidenced by NPO status, ongoing.  Goal:  Intake to meet >90% of estimated nutrition needs.  Monitor:  TF tolerance, labs, weight trend.   Kendell Bane RD, LDN, CNSC [phone] Weekend/After Hours Pager

## 2012-01-09 NOTE — Consult Note (Signed)
Reason for Consult: Altered Mental Status Referring Physician: Molli Knock  CC: Poor responsiveness  HPI: Dionte Blaustein is an 71 y.o. male who was admitted at [facility] due to SOB and was found to have a PE and DVT.  While hospitalized patient developed complications of ARF, acidosis and shock requiring intubation.  After intubation went into PEA arrest and required resuscitation for about 7 minutes before ROSC was obtained.  Patient was transferred here due to renal deterioration and required dialysis.  Has had improvement in renal status and has been extubated but has not returned to baseline mental status.  In work up, CT of the head was performed that shows evidence of small vessel ischemic changes and large ventricles suggestive of NPH.    Past Medical History  Diagnosis Date  . Diabetes mellitus, type 2   . Hypercholesterolemia   . Former smoker   . Bilateral hearing loss     Wears hearing aids  . Chronic kidney disease   . Hearing loss     uses hearing aids    Past Surgical History  Procedure Date  . Spine surgery     Family History  Problem Relation Age of Onset  . Diabetes Father   . Coronary artery disease Brother     Social History:  reports that he has quit smoking. He does not have any smokeless tobacco history on file. He reports that he does not drink alcohol or use illicit drugs.  Allergies  Allergen Reactions  . Metformin And Related     Medications:  I have reviewed the patient's current medications. Scheduled:   . albuterol  2.5 mg Nebulization Q4H  . antiseptic oral rinse  15 mL Mouth Rinse QID  . chlorhexidine  15 mL Mouth Rinse BID  . [COMPLETED] dextrose      . insulin aspart  2-6 Units Subcutaneous Q4H  . pantoprazole (PROTONIX) IV  40 mg Intravenous Q24H  . Warfarin - Pharmacist Dosing Inpatient   Does not apply q1800  . [COMPLETED] warfarin IV  4 mg Intravenous ONCE-1800    ROS: Unable to obtain  Physical Examination: Blood  pressure 150/84, pulse 115, temperature 99.1 F (37.3 C), temperature source Oral, resp. rate 21, height 5\' 10"  (1.778 m), weight 91.4 kg (201 lb 8 oz), SpO2 91.00%.  Neurologic Examination Mental Status: Patient does not respond to verbal stimuli.  Opens eyes with light tactile stimulation.  Says "ouch" when painful stimuli is applied.  Does not follow commands.   Cranial Nerves: II: patient does not respond confrontation bilaterally but seems to attempt to turn away with corneal testing bilaterally, pupils right 4 mm, left 4 mm,and reactive bilaterally III,IV,VI: Left gaze preference.  Unable to get excursion beyond midline with doll's eye maneuvers. V,VII: corneal reflex present bilaterally  VIII: patient does not respond to verbal stimuli IX,X: gag reflex not tested, XI: trapezius strength unable to test bilaterally XII: tongue strength unable to test Motor: Some small degree of movement noted in the LUE and LLE.  No movement noted on the right.   Sensory: Does not respond to noxious stimuli with an "ouch" to painful stimulation on the right but does not respond to painful stimulation on the left. Deep Tendon Reflexes:  2+ with absent AJ's bilaterally Plantars: Triple reflex response noted when plantar responses elicited bilaterally  Cerebellar: Unable to perform  Laboratory Studies:   Basic Metabolic Panel:  Lab 01/09/12 4098 01/08/12 0439 01/07/12 0500 01/06/12 1520 01/06/12 0230 01/05/12 1600 01/05/12 0420  01/04/12 1528  NA 141 135 137 138 137 -- -- --  K 4.0 4.6 4.4 4.0 3.8 -- -- --  CL 105 98 101 102 102 -- -- --  CO2 24 25 27 26 26  -- -- --  GLUCOSE 84 86 190* 178* 123* -- -- --  BUN 38* 25* 30* 31* 36* -- -- --  CREATININE 4.24* 2.19* 2.24* 2.79* 3.51* -- -- --  CALCIUM 8.8 8.8 8.3* -- -- -- -- --  MG -- 2.6* 2.6* -- 2.4 -- 2.3 2.0  PHOS -- 2.8 3.3 3.8 4.1 3.7 -- --    Liver Function Tests:  Lab 01/09/12 0600 02-05-2012 1220 2012/02/05 0439 01/07/12 0500 01/06/12 1520  01/04/12 1528  AST 67* 80* -- -- -- 155*  ALT 165* 198* -- -- -- 424*  ALKPHOS 118* 123* -- -- -- 135*  BILITOT 0.8 0.7 -- -- -- 1.1  PROT 6.8 7.0 -- -- -- 5.7*  ALBUMIN 2.4* 2.4* 2.6* 2.2* 2.0* --   No results found for this basename: LIPASE:5,AMYLASE:5 in the last 168 hours No results found for this basename: AMMONIA:3 in the last 168 hours  CBC:  Lab 01/09/12 0453 02-05-2012 0439 01/07/12 0500 01/06/12 0130 01/05/12 0420  WBC 6.4 6.3 4.5 3.7* 3.6*  NEUTROABS -- -- -- -- --  HGB 9.7* 10.7* 10.5* 9.9* 10.2*  HCT 30.1* 33.2* 32.4* 29.9* 31.7*  MCV 81.4 81.8 80.8 79.5 80.7  PLT 157 139* 110* 124* 142*    Cardiac Enzymes: No results found for this basename: CKTOTAL:5,CKMB:5,CKMBINDEX:5,TROPONINI:5 in the last 168 hours  BNP: No components found with this basename: POCBNP:5  CBG:  Lab 01/09/12 0747 01/09/12 0444 01/09/12 0040 02-05-2012 2038 February 05, 2012 1952  GLUCAP 86 78 76 104* 61*    Microbiology: Results for orders placed during the hospital encounter of 01/04/12  MRSA PCR SCREENING     Status: Normal   Collection Time   01/04/12  3:06 PM      Component Value Range Status Comment   MRSA by PCR NEGATIVE  NEGATIVE Final   CULTURE, RESPIRATORY     Status: Normal   Collection Time   01/04/12  3:14 PM      Component Value Range Status Comment   Specimen Description TRACHEAL ASPIRATE   Final    Special Requests Normal   Final    Gram Stain     Final    Value: FEW WBC PRESENT,BOTH PMN AND MONONUCLEAR     RARE SQUAMOUS EPITHELIAL CELLS PRESENT     FEW GRAM POSITIVE RODS     FEW GRAM POSITIVE COCCI IN PAIRS     RARE YEAST   Culture MODERATE PSEUDOMONAS AERUGINOSA   Final    Report Status 01/07/2012 FINAL   Final    Organism ID, Bacteria PSEUDOMONAS AERUGINOSA   Final     Coagulation Studies:  Basename 01/09/12 0453 02/05/12 0439 01/07/12 0500  LABPROT 16.4* 16.3* 16.9*  INR 1.35 1.34 1.41    Urinalysis:  Lab 01/04/12 1506  COLORURINE YELLOW  LABSPEC 1.010    PHURINE 7.0  GLUCOSEU 100*  HGBUR LARGE*  BILIRUBINUR NEGATIVE  KETONESUR NEGATIVE  PROTEINUR 30*  UROBILINOGEN 1.0  NITRITE NEGATIVE  LEUKOCYTESUR SMALL*    Lipid Panel:     Component Value Date/Time   TRIG 296* 01/07/2012 0500    HgbA1C:  No results found for this basename: HGBA1C    Urine Drug Screen:   No results found for this basename: labopia, cocainscrnur, labbenz, amphetmu, thcu,  labbarb    Alcohol Level: No results found for this basename: ETH:2 in the last 168 hours  Imaging: Ct Head Wo Contrast  01/08/2012  *RADIOLOGY REPORT*  Clinical Data: Altered mental status.  TPA for pulmonary embolism.  CT HEAD WITHOUT CONTRAST  Technique:  Contiguous axial images were obtained from the base of the skull through the vertex without contrast.  Comparison: None.  Findings: Ventricular enlargement out of proportion to the cortical atrophy raises question of normal pressure hydrocephalus.  No acute stroke or bleed.  No extra-axial fluid collection.  Calvarium intact.  Vascular calcification.  Clear sinuses and mastoids. Right cataract extraction.  IMPRESSION: Ventricular enlargement out of portion to cortical atrophy. Question normal pressure hydrocephalus.  No acute stroke or bleed.   Original Report Authenticated By: Davonna Belling, M.D.      Assessment: 71 yo male with altered mental status s/p arrest.  With PE and DVT prior to arrest.  CT shows no evidence of acute changes.  From exam though I suspect that there has been a showering of emboli giving the patient bilateral findings that are asymmetric.  These emboli may very well be small and not able to be appreciated on CT imaging.  May have small areas of ischemia related to arrest and hypoperfusion more pronounced in areas supplied by stenotic arteries.  Would also rule out nonconvulsive status.  Plan: 1.  Patient on Coumadin/Heparin therefore would not start ASA 2.  MRI of the brain when medically stable 3.  EEG  Thana Farr, MD [facility] [phone] 01/09/2012, 10:39 AM

## 2012-01-10 ENCOUNTER — Inpatient Hospital Stay (HOSPITAL_COMMUNITY): Payer: Medicare Other

## 2012-01-10 LAB — GLUCOSE, CAPILLARY
Glucose-Capillary: 103 mg/dL — ABNORMAL HIGH (ref 70–99)
Glucose-Capillary: 122 mg/dL — ABNORMAL HIGH (ref 70–99)
Glucose-Capillary: 124 mg/dL — ABNORMAL HIGH (ref 70–99)
Glucose-Capillary: 154 mg/dL — ABNORMAL HIGH (ref 70–99)

## 2012-01-10 LAB — BASIC METABOLIC PANEL
CO2: 19 mEq/L (ref 19–32)
Chloride: 105 mEq/L (ref 96–112)
GFR calc non Af Amer: 10 mL/min — ABNORMAL LOW (ref >90)
Glucose, Bld: 141 mg/dL — ABNORMAL HIGH (ref 70–99)
Potassium: 4.2 mEq/L (ref 3.5–5.1)
Sodium: 142 mEq/L (ref 135–145)

## 2012-01-10 LAB — CBC
HCT: 31.9 % — ABNORMAL LOW (ref 39.0–52.0)
Hemoglobin: 10.2 g/dL — ABNORMAL LOW (ref 13.0–17.0)
MCH: 26.2 pg (ref 26.0–34.0)
MCV: 81.8 fL (ref 78.0–100.0)
RBC: 3.9 MIL/uL — ABNORMAL LOW (ref 4.22–5.81)

## 2012-01-10 LAB — HEPARIN LEVEL (UNFRACTIONATED): Heparin Unfractionated: 0.31 IU/mL (ref 0.30–0.70)

## 2012-01-10 LAB — HEPATITIS B SURFACE ANTIGEN: Hepatitis B Surface Ag: NEGATIVE

## 2012-01-10 MED ORDER — HEPARIN SODIUM (PORCINE) 1000 UNIT/ML DIALYSIS
1000.0000 [IU] | INTRAMUSCULAR | Status: DC | PRN
  Filled 2012-01-10: qty 1

## 2012-01-10 MED ORDER — NEPRO/CARBSTEADY PO LIQD
237.0000 mL | ORAL | Status: DC | PRN
  Filled 2012-01-10: qty 237

## 2012-01-10 MED ORDER — LIDOCAINE HCL (PF) 1 % IJ SOLN: 5.0000 mL | INTRAMUSCULAR | Status: DC | PRN

## 2012-01-10 MED ORDER — METOPROLOL TARTRATE 1 MG/ML IV SOLN
5.0000 mg | Freq: Four times a day (QID) | INTRAVENOUS | Status: DC
  Administered 2012-01-11 – 2012-01-13 (×10): 5 mg via INTRAVENOUS
  Filled 2012-01-10 (×14): qty 5

## 2012-01-10 MED ORDER — WARFARIN SODIUM 4 MG PO TABS
4.0000 mg | ORAL_TABLET | ORAL | Status: AC
  Administered 2012-01-10: 4 mg via ORAL
  Filled 2012-01-10: qty 1

## 2012-01-10 MED ORDER — SODIUM CHLORIDE 0.9 % IV SOLN: 100.0000 mL | INTRAVENOUS | Status: DC | PRN

## 2012-01-10 MED ORDER — LIDOCAINE-PRILOCAINE 2.5-2.5 % EX CREA
1.0000 "application " | TOPICAL_CREAM | CUTANEOUS | Status: DC | PRN
  Filled 2012-01-10: qty 5

## 2012-01-10 MED ORDER — PENTAFLUOROPROP-TETRAFLUOROETH EX AERO: 1.0000 "application " | INHALATION_SPRAY | CUTANEOUS | Status: DC | PRN

## 2012-01-10 MED ORDER — ALTEPLASE 2 MG IJ SOLR
2.0000 mg | Freq: Once | INTRAMUSCULAR | Status: AC | PRN
  Filled 2012-01-10: qty 2

## 2012-01-10 NOTE — Progress Notes (Signed)
[facility] PROGRESS NOTE  Noah Torres ZOX:[id number] DOB: 04-Jan-1941 DOA: 01/04/2012 PCP: No primary provider on file.  HPI: Noah Torres is an 71 y.o. male who was admitted at [facility] due to SOB and was found to have a PE and DVT. While hospitalized patient developed complications of ARF, acidosis and shock requiring intubation. After intubation went into PEA arrest, was given TPA and required resuscitation for about 7 minutes before ROSC was obtained. Patient was transferred here due to renal deterioration and required dialysis. Has had improvement in renal status and has been extubated but has not returned to baseline mental status. In work up, CT of the head was performed that shows evidence of small vessel ischemic changes and large ventricles suggestive of NPH.   Tests / Events:  11/11 - Saddle PE noted on CT-A, started anticoagulation  11/12 - Echo shows PA pressure 58-60; Doppler shows LLE DVT  11/13 - Develops shock started on pressors, develops resp failure intubated, cardiac arrest - given TPA and resuscitated  11/14 - AKI (Creat increa 1.5-->2.7)  11/16 - GRN in sputum, concern for BLL PNA, start on rocephin  11/17 - stopped all pressors  11/18 - transferred to [facility] and started on CRRT  11/20 - Multiple episodes of bradycardia/hypotension 22/ precedex use, so med stopped  11/21 - Successfully extubated, no improvement in mental status  11/22 - CT shows no stroke, possible normal pressure hydrocephalus   Assessment/Plan:      Acute respiratory failure/ PE (pulmonary thromboembolism) Cont heparin and Coumadin per pharmacy   Acute renal failure/ATN intermittently being dialyzed    Encephalopathy nonverbal Neuro consulted for this- MRI and EEG ordered by Dr Thad Ranger- she suspects b/l embolic infarcts   Normal pressure hydrocephalus Per Neuro  Tachycardia  Due to PE- on metoprolol PRN- will change to routine  HTn  Cont metoprolol  DM  2 Sensitive sliding scale q 6 hrs while on Tube feeds  Cardiac arrest   Code Status: full code Family Communication: none Disposition Plan: follow in sDu DVT prophylaxis: Heparin infusion    Consultants:  Nephro  Neuro   Antibiotics: none  HPI/Subjective: Pt awake but non-verbal and not following commands- seen while on dialysis  Objective: Filed Vitals:   01/10/12 1430 01/10/12 1500 01/10/12 1535 01/10/12 1625  BP: 135/89 132/84 138/81 127/68  Pulse: 106 112 115 120  Temp:   100.1 F (37.8 C) 99.7 F (37.6 C)  TempSrc:   Axillary Oral  Resp: 18 22 23 22   Height:      Weight:      SpO2: 93% 94% 94% 98%    Intake/Output Summary (Last 24 hours) at 01/10/12 1638 Last data filed at 01/10/12 1535  Gross per 24 hour  Intake 1007.68 ml  Output   4000 ml  Net -2992.32 ml    Exam:   General:  Alert, non-verbal, not follow ing commands- tongue protruding from mouth  Cardiovascular: RRR, tachy  Respiratory: CTA b/l   Abdomen: soft, nt, nd, BS+  Ext: trace edema b/l   Data Reviewed: Basic Metabolic Panel:  Lab 01/10/12 8119 01/09/12 0600 01/08/12 0439 01/07/12 0500 01/06/12 1520 01/06/12 0230 01/05/12 1600 01/05/12 0420 01/04/12 1528  NA 142 141 135 137 138 -- -- -- --  K 4.2 4.0 4.6 4.4 4.0 -- -- -- --  CL 105 105 98 101 102 -- -- -- --  CO2 19 24 25 27 26  -- -- -- --  GLUCOSE 141* 84 86 190* 178* -- -- -- --  BUN 52* 38* 25* 30* 31* -- -- -- --  CREATININE 5.47* 4.24* 2.19* 2.24* 2.79* -- -- -- --  CALCIUM 9.1 8.8 8.8 8.3* 8.0* -- -- -- --  MG -- -- 2.6* 2.6* -- 2.4 -- 2.3 2.0  PHOS -- -- 2.8 3.3 3.8 4.1 3.7 -- --   Liver Function Tests:  Lab 01/10/12 1152 01/09/12 0600 01/08/12 1220 01/08/12 0439 01/07/12 0500 01/06/12 1520 01/04/12 1528  AST -- 67* 80* -- -- -- 155*  ALT 125* 165* 198* -- -- -- 424*  ALKPHOS -- 118* 123* -- -- -- 135*  BILITOT -- 0.8 0.7 -- -- -- 1.1  PROT -- 6.8 7.0 -- -- -- 5.7*  ALBUMIN -- 2.4* 2.4* 2.6* 2.2* 2.0* --    No results found for this basename: LIPASE:5,AMYLASE:5 in the last 168 hours No results found for this basename: AMMONIA:5 in the last 168 hours CBC:  Lab 01/10/12 0435 01/09/12 0453 01/08/12 0439 01/07/12 0500 01/06/12 0130  WBC 7.7 6.4 6.3 4.5 3.7*  NEUTROABS -- -- -- -- --  HGB 10.2* 9.7* 10.7* 10.5* 9.9*  HCT 31.9* 30.1* 33.2* 32.4* 29.9*  MCV 81.8 81.4 81.8 80.8 79.5  PLT 224 157 139* 110* 124*   Cardiac Enzymes: No results found for this basename: CKTOTAL:5,CKMB:5,CKMBINDEX:5,TROPONINI:5 in the last 168 hours BNP (last 3 results) No results found for this basename: PROBNP:3 in the last 8760 hours CBG:  Lab 01/10/12 1612 01/10/12 0727 01/10/12 0422 01/10/12 0010 01/09/12 2032  GLUCAP 152* 103* 124* 122* 105*    Recent Results (from the past 240 hour(s))  MRSA PCR SCREENING     Status: Normal   Collection Time   01/04/12  3:06 PM      Component Value Range Status Comment   MRSA by PCR NEGATIVE  NEGATIVE Final   CULTURE, RESPIRATORY     Status: Normal   Collection Time   01/04/12  3:14 PM      Component Value Range Status Comment   Specimen Description TRACHEAL ASPIRATE   Final    Special Requests Normal   Final    Gram Stain     Final    Value: FEW WBC PRESENT,BOTH PMN AND MONONUCLEAR     RARE SQUAMOUS EPITHELIAL CELLS PRESENT     FEW GRAM POSITIVE RODS     FEW GRAM POSITIVE COCCI IN PAIRS     RARE YEAST   Culture MODERATE PSEUDOMONAS AERUGINOSA   Final    Report Status 01/07/2012 FINAL   Final    Organism ID, Bacteria PSEUDOMONAS AERUGINOSA   Final      Studies: Ct Head Wo Contrast  01/08/2012  *RADIOLOGY REPORT*  Clinical Data: Altered mental status.  TPA for pulmonary embolism.  CT HEAD WITHOUT CONTRAST  Technique:  Contiguous axial images were obtained from the base of the skull through the vertex without contrast.  Comparison: None.  Findings: Ventricular enlargement out of proportion to the cortical atrophy raises question of normal pressure  hydrocephalus.  No acute stroke or bleed.  No extra-axial fluid collection.  Calvarium intact.  Vascular calcification.  Clear sinuses and mastoids. Right cataract extraction.  IMPRESSION: Ventricular enlargement out of portion to cortical atrophy. Question normal pressure hydrocephalus.  No acute stroke or bleed.   Original Report Authenticated By: Davonna Belling, M.D.    US Renal Port  01/04/2012  *RADIOLOGY REPORT*  Clinical Data: Acute renal failure.  RENAL/URINARY TRACT ULTRASOUND COMPLETE  Comparison:  None  Findings:  Right Kidney:  11.4 cm in length.  Normal renal cortical thickness but slight increased echogenicity.  No focal renal lesions or hydronephrosis.  Left Kidney:  10.0 cm in length.  Normal renal cortical thickness and slight increased echogenicity but no focal renal lesion or hydronephrosis.  Bladder:  Decompressed by a Foley catheter.  IMPRESSION:  Slight increased echogenicity of both kidneys but no hydronephrosis.   Original Report Authenticated By: Rudie Meyer, M.D.    Dg Chest Port 1 View  01/07/2012  *RADIOLOGY REPORT*  Clinical Data: Assess endotracheal tube.  PORTABLE CHEST - 1 VIEW  Comparison: 01/06/2012  Findings: Endotracheal tube is 2.6 cm above the carina.  Right jugular central venous catheter in the upper SVC region. Persistent bibasilar densities may represent consolidation and pleural fluid.  Prominent interstitial markings are suggestive for mild edema.  Heart size is stable.  No evidence for a pneumothorax. Left jugular central venous catheter tip is near the junction of the left innominate vein and SVC. Nasogastric tube extends into the abdomen.  IMPRESSION: Stable chest radiograph findings.  Suspect pulmonary edema and pleural effusions.  Stable position of support apparatuses.   Original Report Authenticated By: Richarda Overlie, M.D.    Dg Chest Port 1 View  01/06/2012  *RADIOLOGY REPORT*  Clinical Data: Endotracheal tube placement, shortness of breath.  PORTABLE CHEST -  1 VIEW  Comparison: 01/05/2012.  Findings: Endotracheal tube is in satisfactory position. Nasogastric tube is followed into the stomach with the tip projecting beyond the inferior boundary of the film.  Right IJ central line tip projects over the SVC.  Left IJ central line tip projects in the region of the left brachiocephalic vein.  Heart size stable. Bilateral mid/lower lung zone airspace disease and left lower lobe collapse/consolidation persist.  Bilateral pleural effusions.  Biapical pleural thickening.  IMPRESSION: Probable congestive heart failure with left lower lobe collapse/consolidation.   Original Report Authenticated By: Leanna Battles, M.D.    Dg Chest Port 1 View  01/04/2012  *RADIOLOGY REPORT*  Clinical Data: 71 year old male status post placement of catheter for dialysis.  PORTABLE CHEST - 1 VIEW  Comparison: None.  Findings: Semi upright AP portable view at 1604 hours. Endotracheal tube tip between level of clavicles and carina. Enteric tube courses to the abdomen, tip not included.  Left IJ approach single lumen catheter, tip terminates to the right of the midline, probably at the innominate vein confluence.  Right IJ approach dual lumen dialysis type catheter, tip just below the level of the carina.   Cardiac size and mediastinal contours are within normal limits. Dense retrocardiac opacity.  Probable small effusions.  Pulmonary vascular congestion without overt edema.  No pneumothorax identified.  IMPRESSION: 1.  Right IJ dual lumen catheter placed, tip at the SVC level. 2.  Other lines and tubes placed as above. 3.  No pneumothorax. Small bilateral pleural effusions.  Vascular congestion.  Bilateral lower lobe collapse or consolidation.   Original Report Authenticated By: Erskine Speed, M.D.     Scheduled Meds:   . albuterol  2.5 mg Nebulization Q4H  . antiseptic oral rinse  15 mL Mouth Rinse QID  . chlorhexidine  15 mL Mouth Rinse BID  . insulin aspart  2-6 Units Subcutaneous Q4H  .  pantoprazole (PROTONIX) IV  40 mg Intravenous Q24H  . [COMPLETED] warfarin  4 mg Per NG tube ONCE-1800  . Warfarin - Pharmacist Dosing Inpatient   Does not apply q1800   Continuous Infusions:   . feeding supplement (OSMOLITE 1.2  CAL) 1,000 mL (01/10/12 1051)  . heparin 950 Units/hr (01/10/12 0700)    ________________________________________________________________________  Time spent: 35 min    [facility]  [facility] Pager [phone] If 8PM-8AM, please contact night-coverage at [email or web address], password [facility] 01/10/2012, 4:38 PM  LOS: 6 days

## 2012-01-10 NOTE — Procedures (Signed)
Patient seen on Hemodialysis. QB 400, UF goal 3.5L Treatment adjusted as needed.  Zetta Bills MD [facility]. Office # [phone] Pager # [phone] 12:01 PM

## 2012-01-10 NOTE — Progress Notes (Signed)
ANTICOAGULATION CONSULT NOTE - Follow Up Consult  Pharmacy Consult for Heparin and Coumadin Indication: pulmonary embolus  Allergies  Allergen Reactions  . Metformin And Related     Patient Measurements: Height: 5\' 10"  (177.8 cm) Weight: 195 lb 8.8 oz (88.7 kg) IBW/kg (Calculated) : 73  Heparin Dosing Weight: 98 kg  Vital Signs: Temp: 99.7 F (37.6 C) (11/24 1625) Temp src: Oral (11/24 1625) BP: 124/72 mmHg (11/24 1700) Pulse Rate: 116  (11/24 1700)  Labs:  Basename 01/10/12 1500 01/10/12 0435 01/09/12 0600 01/09/12 0453 01/08/12 0439  HGB -- 10.2* -- 9.7* --  HCT -- 31.9* -- 30.1* 33.2*  PLT -- 224 -- 157 139*  APTT -- -- -- -- --  LABPROT -- 18.6* -- 16.4* 16.3*  INR -- 1.61* -- 1.35 1.34  HEPARINUNFRC 0.31 0.25* -- 0.37 --  CREATININE -- 5.47* 4.24* -- 2.19*  CKTOTAL -- -- -- -- --  CKMB -- -- -- -- --  TROPONINI -- -- -- -- --    Estimated Creatinine Clearance: 14.1 ml/min (by C-G formula based on Cr of 5.47).   Medications:  Infusions:    . feeding supplement (OSMOLITE 1.2 CAL) 1,000 mL (01/10/12 1051)  . heparin 950 Units/hr (01/10/12 1800)    Assessment: 71 y/o M on heparin to bridge Coumadin for saddle PE. Heparin level is therapeutic on the low end of normal and appears to be trending down. INR is subtherapeutic. No bleeding noted, CBC is normal.  Goal of Therapy:  INR 2-3 Heparin level 0.3-0.7 units/ml Monitor platelets by anticoagulation protocol: Yes   Plan:  -Increase heparin drip to 1050 units/hr -Coumadin 4 mg po tonight -Daily heparin level, CBC, and INR  [facility], Clarissa.D., BCPS Clinical Pharmacist Pager: [phone] 01/10/2012 6:35 PM

## 2012-01-10 NOTE — Progress Notes (Signed)
ANTICOAGULATION CONSULT NOTE - Follow Up Consult  Pharmacy Consult for heparin Indication: pulmonary embolus  Labs:  Basename 01/10/12 0435 01/09/12 0600 01/09/12 0453 01/08/12 0439  HGB 10.2* -- 9.7* --  HCT 31.9* -- 30.1* 33.2*  PLT 224 -- 157 139*  APTT -- -- -- --  LABPROT 18.6* -- 16.4* 16.3*  INR 1.61* -- 1.35 1.34  HEPARINUNFRC 0.25* -- 0.37 0.41  CREATININE -- 4.24* -- 2.19*  CKTOTAL -- -- -- --  CKMB -- -- -- --  TROPONINI -- -- -- --    Assessment: 71yo male now subtherapeutic on heparin after several levels at goal though levels have been trending down.  Goal of Therapy:  Heparin level 0.3-0.7 units/ml   Plan:  Will increase heparin gtt slightly to 950 units/hr (had previously been supratherapeutic at higher rates) and check level in 8hr.  Colleen Can PharmD BCPS 01/10/2012,5:35 AM

## 2012-01-10 NOTE — Progress Notes (Signed)
Patient ID: Noah Torres, male   DOB: 05-Jun-1940, 71 y.o.   MRN: [id number]   [facility] Progress Note    Subjective:   No acute events noted overnight, new consultation note reviewed.    Objective:   BP 145/85  Pulse 116  Temp 100 F (37.8 C) (Axillary)  Resp 17  Ht 5\' 10"  (1.778 m)  Wt 89.4 kg (197 lb 1.5 oz)  BMI 28.28 kg/m2  SpO2 96%  Intake/Output Summary (Last 24 hours) at 01/10/12 0849 Last data filed at 01/10/12 3086  Gross per 24 hour  Intake 1239.68 ml  Output   1450 ml  Net -210.32 ml   Weight change: -2 kg (-4 lb 6.6 oz)  Physical Exam: Gen: Somnolent and resting bed, opens eyes to voice/touch. No verbal responses. CVS: Pulse regular tachycardia, heart sounds S1 and S2 normal Resp: Coarse breath sounds bilaterally-appear transmitted. No rales/rhonchi Abd: Soft, flat, nontender and bowel sounds are normal Ext: 2+ edema over lower extremities  Imaging: Ct Head Wo Contrast  01/08/2012  *RADIOLOGY REPORT*  Clinical Data: Altered mental status.  TPA for pulmonary embolism.  CT HEAD WITHOUT CONTRAST  Technique:  Contiguous axial images were obtained from the base of the skull through the vertex without contrast.  Comparison: None.  Findings: Ventricular enlargement out of proportion to the cortical atrophy raises question of normal pressure hydrocephalus.  No acute stroke or bleed.  No extra-axial fluid collection.  Calvarium intact.  Vascular calcification.  Clear sinuses and mastoids. Right cataract extraction.  IMPRESSION: Ventricular enlargement out of portion to cortical atrophy. Question normal pressure hydrocephalus.  No acute stroke or bleed.   Original Report Authenticated By: Davonna Belling, M.D.     Labs: BMET  Lab 01/10/12 0435 01/09/12 0600 01/08/12 0439 01/07/12 0500 01/06/12 1520 01/06/12 0230 01/05/12 1600 01/05/12 0420 01/04/12 1528  NA 142 141 135 137 138 137 136 -- --  K 4.2 4.0 4.6 4.4 4.0 3.8 4.2 -- --  CL 105 105 98 101 102  102 101 -- --  CO2 19 24 25 27 26 26 24  -- --  GLUCOSE 141* 84 86 190* 178* 123* 140* -- --  BUN 52* 38* 25* 30* 31* 36* 40* -- --  CREATININE 5.47* 4.24* 2.19* 2.24* 2.79* 3.51* 4.43* -- --  ALB -- -- -- -- -- -- -- -- --  CALCIUM 9.1 8.8 8.8 8.3* 8.0* 7.8* 7.5* -- --  PHOS -- -- 2.8 3.3 3.8 4.1 3.7 4.7* 5.7*   CBC  Lab 01/10/12 0435 01/09/12 0453 01/08/12 0439 01/07/12 0500  WBC 7.7 6.4 6.3 4.5  NEUTROABS -- -- -- --  HGB 10.2* 9.7* 10.7* 10.5*  HCT 31.9* 30.1* 33.2* 32.4*  MCV 81.8 81.4 81.8 80.8  PLT 224 157 139* 110*    Medications:      . albuterol  2.5 mg Nebulization Q4H  . antiseptic oral rinse  15 mL Mouth Rinse QID  . chlorhexidine  15 mL Mouth Rinse BID  . insulin aspart  2-6 Units Subcutaneous Q4H  . pantoprazole (PROTONIX) IV  40 mg Intravenous Q24H  . [COMPLETED] warfarin  4 mg Per NG tube ONCE-1800  . Warfarin - Pharmacist Dosing Inpatient   Does not apply q1800     Assessment/ Plan:   1. Acute renal failure: timeline of events appear to favor ischemic ATN with cardiorespiratory arrest/renal hypoperfusion injury. Urine output 1450 cc of urine over the past 24 hours. Renal recovery is not evident yet-no clearance at  this time. Plan for hemodialysis today in reassessment of the next few days.  2. Saddle pulmonary embolus: hemodynamically stable and on anti-coagulation with heparin per pharmacy.  3. Elevated LFTs: Likely shock liver- monitor with improving BP  4. S/p cardiorespiratory arrest: Neurology notes reviewed with regards to his altered mental status-plans noted for EEG/MRI.   Zetta Bills, MD 01/10/2012, 8:49 AM

## 2012-01-11 ENCOUNTER — Inpatient Hospital Stay (HOSPITAL_COMMUNITY): Payer: Medicare Other

## 2012-01-11 LAB — CBC
HCT: 30.7 % — ABNORMAL LOW (ref 39.0–52.0)
Hemoglobin: 10.2 g/dL — ABNORMAL LOW (ref 13.0–17.0)
MCH: 26.5 pg (ref 26.0–34.0)
MCHC: 33.2 g/dL (ref 30.0–36.0)
RBC: 3.85 MIL/uL — ABNORMAL LOW (ref 4.22–5.81)

## 2012-01-11 LAB — HEPARIN LEVEL (UNFRACTIONATED): Heparin Unfractionated: 0.22 IU/mL — ABNORMAL LOW (ref 0.30–0.70)

## 2012-01-11 LAB — GLUCOSE, CAPILLARY
Glucose-Capillary: 134 mg/dL — ABNORMAL HIGH (ref 70–99)
Glucose-Capillary: 143 mg/dL — ABNORMAL HIGH (ref 70–99)
Glucose-Capillary: 205 mg/dL — ABNORMAL HIGH (ref 70–99)

## 2012-01-11 MED ORDER — ALBUTEROL SULFATE (5 MG/ML) 0.5% IN NEBU
2.5000 mg | INHALATION_SOLUTION | Freq: Four times a day (QID) | RESPIRATORY_TRACT | Status: DC
  Administered 2012-01-11 – 2012-01-13 (×7): 2.5 mg via RESPIRATORY_TRACT
  Filled 2012-01-11 (×7): qty 0.5

## 2012-01-11 MED ORDER — WARFARIN SODIUM 4 MG PO TABS
4.0000 mg | ORAL_TABLET | Freq: Once | ORAL | Status: AC
  Administered 2012-01-11: 4 mg via ORAL
  Filled 2012-01-11: qty 1

## 2012-01-11 NOTE — Progress Notes (Signed)
[facility] [place] TEAM 1 - Stepdown/ICU TEAM  [name] WUX:[id number] DOB: 05-08-1940 DOA: 01/04/2012 PCP: No primary provider on file.  HPI: Noah Torres is an 71 y.o. male who was admitted at [facility] due to SOB and was found to have a PE and DVT. While hospitalized patient developed complications of ARF, acidosis and shock requiring intubation. After intubation went into PEA arrest, was given TPA and required resuscitation for about 7 minutes before ROSC was obtained. Patient was transferred here due to renal deterioration and need for dialysis. Has had improvement in renal status and has been extubated but has not returned to baseline mental status. In work up, CT of the head was performed that shows evidence of small vessel ischemic changes and large ventricles suggestive of NPH.   Tests / Events:  11/11 - Saddle PE noted on CT-A, started anticoagulation  11/12 - Echo shows PA pressure 58-60; Doppler shows LLE DVT  11/13 - Develops shock started on pressors, develops resp failure intubated, cardiac arrest - given TPA and resuscitated  11/14 - AKI (Creat increa 1.5-->2.7)  11/16 - GRN in sputum, concern for BLL PNA, start on rocephin  11/17 - stopped all pressors  11/18 - transferred to [facility] and started on CRRT  11/20 - Multiple episodes of bradycardia/hypotension 2/2 precedex use, so med stopped  11/21 - Successfully extubated, no improvement in mental status  11/22 - CT shows no stroke, possible normal pressure hydrocephalus   Assessment/Plan:  LLE DVT w/ Saddle Pulmonary Embolus w/ cardio-respiratory collapse Required TPA at outside hospital - cont heparin and Coumadin per pharmacy  Acute renal failure / ATN on CKD (baseline crt 1.9) Ischemic ATN related to cardiac arrest - Nephrology is following - intermittently being dialyzed   Encephalopathy - ? NPH Differential includes diffuse ischemic injury due to cardiac arrest vs/ ?of NPH - Neuro following  - EEG and MRI pending  transaminitis / shock liver F/u LFTs in AM  Tachycardia  Due to PE- on metoprolol  HTN Reasonably controlled on current regimen  DM 2 Sensitive sliding scale q 6 hrs while on tube feeds  S/P Cardiac arrest (PEA) at outside hospital  Severe protein calorie malnutrition Continue tube feeds  Reported GNR on sputum at OSH, s/p Rocephin x 3 days Restart antibiotics if develops fever   Code Status: full code Family Communication: none Disposition Plan: SDU DVT prophylaxis: Heparin infusion  Consultants:  Nephro  Neuro   Antibiotics:  Ceftriaxone 11/16>>>11/18  HPI/Subjective: The patient is essentially obtunded the time of my visit.  He will barely open his eyes.  He will not follow simple commands.  He makes no attempt to communicate with the examiner.  There is no family present at the time of my exam.  Objective: Filed Vitals:   01/11/12 0746 01/11/12 0834 01/11/12 1154 01/11/12 1250  BP:  131/68  144/84  Pulse:  108  104  Temp:  99.1 F (37.3 C)  99.2 F (37.3 C)  TempSrc:  Axillary  Axillary  Resp:  25  17  Height:      Weight:      SpO2: 99% 96% 98% 98%    Intake/Output Summary (Last 24 hours) at 01/11/12 1411 Last data filed at 01/11/12 1251  Gross per 24 hour  Intake 1584.07 ml  Output   4050 ml  Net -2465.93 ml    Exam:  General: No acute respiratory distress Lungs: Bibasilar crackles with good air movement throughout other fields with no  wheeze Cardiovascular: Tachycardic but regular without appreciable gallop rub or murmur Abdomen: Nontender, nondistended, soft, bowel sounds positive, no rebound, no ascites, no appreciable mass Extremities: No significant cyanosis, clubbing, or edema bilateral lower extremities  Data Reviewed: Basic Metabolic Panel:  Lab 01/10/12 4782 01/09/12 0600 01/08/12 0439 01/07/12 0500 01/06/12 1520 01/06/12 0230 01/05/12 1600 01/05/12 0420 01/04/12 1528  NA 142 141 135 137 138 -- -- -- --    K 4.2 4.0 4.6 4.4 4.0 -- -- -- --  CL 105 105 98 101 102 -- -- -- --  CO2 19 24 25 27 26  -- -- -- --  GLUCOSE 141* 84 86 190* 178* -- -- -- --  BUN 52* 38* 25* 30* 31* -- -- -- --  CREATININE 5.47* 4.24* 2.19* 2.24* 2.79* -- -- -- --  CALCIUM 9.1 8.8 8.8 8.3* 8.0* -- -- -- --  MG -- -- 2.6* 2.6* -- 2.4 -- 2.3 2.0  PHOS -- -- 2.8 3.3 3.8 4.1 3.7 -- --   Liver Function Tests:  Lab 01/10/12 1152 01/09/12 0600 01/08/12 1220 01/08/12 0439 01/07/12 0500 01/06/12 1520 01/04/12 1528  AST -- 67* 80* -- -- -- 155*  ALT 125* 165* 198* -- -- -- 424*  ALKPHOS -- 118* 123* -- -- -- 135*  BILITOT -- 0.8 0.7 -- -- -- 1.1  PROT -- 6.8 7.0 -- -- -- 5.7*  ALBUMIN -- 2.4* 2.4* 2.6* 2.2* 2.0* --   CBC:  Lab 01/11/12 0500 01/10/12 0435 01/09/12 0453 01/08/12 0439 01/07/12 0500  WBC 6.5 7.7 6.4 6.3 4.5  NEUTROABS -- -- -- -- --  HGB 10.2* 10.2* 9.7* 10.7* 10.5*  HCT 30.7* 31.9* 30.1* 33.2* 32.4*  MCV 79.7 81.8 81.4 81.8 80.8  PLT 276 224 157 139* 110*   CBG:  Lab 01/11/12 1212 01/11/12 0836 01/11/12 0416 01/11/12 01/10/12 1958  GLUCAP 151* 205* 134* 143* 154*    Recent Results (from the past 240 hour(s))  MRSA PCR SCREENING     Status: Normal   Collection Time   01/04/12  3:06 PM      Component Value Range Status Comment   MRSA by PCR NEGATIVE  NEGATIVE Final   CULTURE, RESPIRATORY     Status: Normal   Collection Time   01/04/12  3:14 PM      Component Value Range Status Comment   Specimen Description TRACHEAL ASPIRATE   Final    Special Requests Normal   Final    Gram Stain     Final    Value: FEW WBC PRESENT,BOTH PMN AND MONONUCLEAR     RARE SQUAMOUS EPITHELIAL CELLS PRESENT     FEW GRAM POSITIVE RODS     FEW GRAM POSITIVE COCCI IN PAIRS     RARE YEAST   Culture MODERATE PSEUDOMONAS AERUGINOSA   Final    Report Status 01/07/2012 FINAL   Final    Organism ID, Bacteria PSEUDOMONAS AERUGINOSA   Final      Studies: Ct Head Wo Contrast  01/08/2012  *RADIOLOGY REPORT*  Clinical  Data: Altered mental status.  TPA for pulmonary embolism.  CT HEAD WITHOUT CONTRAST  Technique:  Contiguous axial images were obtained from the base of the skull through the vertex without contrast.  Comparison: None.  IMPRESSION: Ventricular enlargement out of portion to cortical atrophy. Question normal pressure hydrocephalus.  No acute stroke or bleed.   Original Report Authenticated By: Davonna Belling, M.D.    US Renal Port  01/04/2012  *RADIOLOGY REPORT*  Clinical Data: Acute renal failure.  RENAL/URINARY TRACT ULTRASOUND COMPLETE  Comparison:  None  IMPRESSION:  Slight increased echogenicity of both kidneys but no hydronephrosis.   Original Report Authenticated By: Rudie Meyer, M.D.     Scheduled Meds:    . albuterol  2.5 mg Nebulization Q4H  . antiseptic oral rinse  15 mL Mouth Rinse QID  . chlorhexidine  15 mL Mouth Rinse BID  . insulin aspart  2-6 Units Subcutaneous Q4H  . metoprolol  5 mg Intravenous Q6H  . pantoprazole (PROTONIX) IV  40 mg Intravenous Q24H  . [COMPLETED] warfarin  4 mg Oral NOW  . Warfarin - Pharmacist Dosing Inpatient   Does not apply q1800   Continuous Infusions:    . feeding supplement (OSMOLITE 1.2 CAL) 1,000 mL (01/10/12 1630)  . heparin 1,200 Units/hr (01/11/12 0600)    _______________________________________________________________________  Time spent: 35 min  Lonia Blood, MD [facility] Office  [phone] Pager [phone]  On-Call/Text Page:      [email or web address]      password [facility]  01/11/2012, 2:11 PM  LOS: 7 days

## 2012-01-11 NOTE — Progress Notes (Signed)
Subjective: Patient unchanged.  Family in the room.  Conversation had at length (30 minutes) concerning patient's current condition and possible etiologies/prognosis.  MRI pending.  Objective: Current vital signs: BP 131/68  Pulse 108  Temp 99.1 F (37.3 C) (Axillary)  Resp 25  Ht 5\' 10"  (1.778 m)  Wt 88.7 kg (195 lb 8.8 oz)  BMI 28.06 kg/m2  SpO2 98% Vital signs in last 24 hours: Temp:  [99.1 F (37.3 C)-100.2 F (37.9 C)] 99.1 F (37.3 C) (11/25 0834) Pulse Rate:  [101-121] 108  (11/25 0834) Resp:  [16-28] 25  (11/25 0834) BP: (118-171)/(57-101) 131/68 mmHg (11/25 0834) SpO2:  [93 %-100 %] 98 % (11/25 1154)  Intake/Output from previous day: 11/24 0701 - 11/25 0700 In: 1842.6 [I.V.:572.6; NG/GT:1270] Out: 3875 [Urine:975] Intake/Output this shift: Total I/O In: -  Out: 200 [Urine:200] Nutritional status: NPO  Neurologic Exam: Mental Status:  Patient does not respond to verbal stimuli. Opens eyes with light tactile stimulation. Says "ouch" when painful stimuli is applied. Does not follow commands.  Cranial Nerves:  II: patient does not respond confrontation bilaterally but seems to attempt to turn away with corneal testing bilaterally, pupils right 4 mm, left 4 mm,and reactive bilaterally  III,IV,VI: Left gaze preference. Unable to get excursion beyond midline with doll's eye maneuvers.  V,VII: corneal reflex present bilaterally  VIII: patient does not respond to verbal stimuli  IX,X: gag reflex not tested, XI: trapezius strength unable to test bilaterally  XII: tongue strength unable to test  Motor:  Some small degree of movement noted in the LUE and LLE. No movement noted on the right.  Sensory:  Does not respond to noxious stimuli with an "ouch" to painful stimulation on the right but does not respond to painful stimulation on the left.  Deep Tendon Reflexes:  2+ with absent AJ's bilaterally  Plantars:  Triple reflex response noted when plantar responses elicited  bilaterally  Cerebellar:  Unable to perform   Lab Results: Basic Metabolic Panel:  Lab 01/10/12 1610 01/09/12 0600 01/08/12 0439 01/07/12 0500 01/06/12 1520 01/06/12 0230 01/05/12 1600 01/05/12 0420 01/04/12 1528  NA 142 141 135 137 138 -- -- -- --  K 4.2 4.0 4.6 4.4 4.0 -- -- -- --  CL 105 105 98 101 102 -- -- -- --  CO2 19 24 25 27 26  -- -- -- --  GLUCOSE 141* 84 86 190* 178* -- -- -- --  BUN 52* 38* 25* 30* 31* -- -- -- --  CREATININE 5.47* 4.24* 2.19* 2.24* 2.79* -- -- -- --  CALCIUM 9.1 8.8 8.8 -- -- -- -- -- --  MG -- -- 2.6* 2.6* -- 2.4 -- 2.3 2.0  PHOS -- -- 2.8 3.3 3.8 4.1 3.7 -- --    Liver Function Tests:  Lab 01/10/12 1152 01/09/12 0600 01/08/12 1220 01/08/12 0439 01/07/12 0500 01/06/12 1520 01/04/12 1528  AST -- 67* 80* -- -- -- 155*  ALT 125* 165* 198* -- -- -- 424*  ALKPHOS -- 118* 123* -- -- -- 135*  BILITOT -- 0.8 0.7 -- -- -- 1.1  PROT -- 6.8 7.0 -- -- -- 5.7*  ALBUMIN -- 2.4* 2.4* 2.6* 2.2* 2.0* --   No results found for this basename: LIPASE:5,AMYLASE:5 in the last 168 hours No results found for this basename: AMMONIA:3 in the last 168 hours  CBC:  Lab 01/11/12 0500 01/10/12 0435 01/09/12 0453 01/08/12 0439 01/07/12 0500  WBC 6.5 7.7 6.4 6.3 4.5  NEUTROABS -- -- -- -- --  HGB 10.2* 10.2* 9.7* 10.7* 10.5*  HCT 30.7* 31.9* 30.1* 33.2* 32.4*  MCV 79.7 81.8 81.4 81.8 80.8  PLT 276 224 157 139* 110*    Cardiac Enzymes: No results found for this basename: CKTOTAL:5,CKMB:5,CKMBINDEX:5,TROPONINI:5 in the last 168 hours  Lipid Panel:  Lab 01/07/12 0500 01/04/12 1528  CHOL -- --  TRIG 296* 239*  HDL -- --  CHOLHDL -- --  VLDL -- --  LDLCALC -- --    CBG:  Lab 01/11/12 0836 01/11/12 0416 01/11/12 02-01-12 1958 01-Feb-2012 1612  GLUCAP 205* 134* 143* 154* 152*    Microbiology: Results for orders placed during the hospital encounter of 01/04/12  MRSA PCR SCREENING     Status: Normal   Collection Time   01/04/12  3:06 PM      Component Value  Range Status Comment   MRSA by PCR NEGATIVE  NEGATIVE Final   CULTURE, RESPIRATORY     Status: Normal   Collection Time   01/04/12  3:14 PM      Component Value Range Status Comment   Specimen Description TRACHEAL ASPIRATE   Final    Special Requests Normal   Final    Gram Stain     Final    Value: FEW WBC PRESENT,BOTH PMN AND MONONUCLEAR     RARE SQUAMOUS EPITHELIAL CELLS PRESENT     FEW GRAM POSITIVE RODS     FEW GRAM POSITIVE COCCI IN PAIRS     RARE YEAST   Culture MODERATE PSEUDOMONAS AERUGINOSA   Final    Report Status 01/07/2012 FINAL   Final    Organism ID, Bacteria PSEUDOMONAS AERUGINOSA   Final     Coagulation Studies:  Basename 01/11/12 0500 February 01, 2012 0435 01/09/12 0453  LABPROT 19.4* 18.6* 16.4*  INR 1.70* 1.61* 1.35    Imaging: No results found.  Medications:  I have reviewed the patient's current medications. Scheduled:   . albuterol  2.5 mg Nebulization Q4H  . antiseptic oral rinse  15 mL Mouth Rinse QID  . chlorhexidine  15 mL Mouth Rinse BID  . insulin aspart  2-6 Units Subcutaneous Q4H  . metoprolol  5 mg Intravenous Q6H  . pantoprazole (PROTONIX) IV  40 mg Intravenous Q24H  . [COMPLETED] warfarin  4 mg Oral NOW  . Warfarin - Pharmacist Dosing Inpatient   Does not apply q1800    Assessment/Plan:  Patient Active Hospital Problem List: Encephalopathy (01/09/2012)   Assessment: Patient unchanged.     Plan:  1.  EEG and MRI pending    LOS: 7 days   Thana Farr, MD [facility] [phone] 01/11/2012  11:57 AM

## 2012-01-11 NOTE — Progress Notes (Addendum)
ANTICOAGULATION CONSULT NOTE - Follow Up Consult  Pharmacy Consult for heparin / Coumadin Indication: pulmonary embolus / DVT  Labs:  Basename 01/11/12 1610 01/11/12 0500 01/10/12 1500 01/10/12 0435 01/09/12 0600 01/09/12 0453  HGB -- 10.2* -- 10.2* -- --  HCT -- 30.7* -- 31.9* -- 30.1*  PLT -- 276 -- 224 -- 157  APTT -- -- -- -- -- --  LABPROT -- 19.4* -- 18.6* -- 16.4*  INR -- 1.70* -- 1.61* -- 1.35  HEPARINUNFRC 0.44 0.22* 0.31 -- -- --  CREATININE -- -- -- 5.47* 4.24* --  CKTOTAL -- -- -- -- -- --  CKMB -- -- -- -- -- --  TROPONINI -- -- -- -- -- --    Assessment: 71yo male with PE. HL therapeutic this AM INR increasing = 1.70'  Goal of Therapy:  Heparin level 0.3-0.7 units/ml   Plan:  1) Continue heparin at 1200 units / hr 2) Coumadin 4 mg po x 1 dose tonight 3) Follow up AM  Thank you. Okey Regal, PharmD [phone]  01/11/2012,5:09 PM

## 2012-01-11 NOTE — Progress Notes (Signed)
Patient ID: Noah Torres, male   DOB: Oct 09, 1940, 71 y.o.   MRN: [id number]   [facility] Progress Note    Subjective:   HD yesterday, no events, 2900 removed   Objective:   BP 131/68  Pulse 108  Temp 99.1 F (37.3 C) (Axillary)  Resp 25  Ht 5\' 10"  (1.778 m)  Wt 88.7 kg (195 lb 8.8 oz)  BMI 28.06 kg/m2  SpO2 96%  Intake/Output Summary (Last 24 hours) at 01/11/12 0901 Last data filed at 01/11/12 0837  Gross per 24 hour  Intake 1703.57 ml  Output   3850 ml  Net -2146.43 ml   Weight change: -0.7 kg (-1 lb 8.7 oz)  Physical Exam: Gen: Somnolent and resting bed, opens eyes to voice/touch. No verbal responses. No commands CVS: Pulse regular tachycardia, heart sounds S1 and S2 normal Resp: Coarse breath sounds bilaterally-appear transmitted. No rales/rhonchi Abd: Soft, flat, nontender and bowel sounds are normal Ext: 2+ edema over lower extremities  Imaging: No results found.  Labs: BMET  Lab 01/10/12 0435 01/09/12 0600 01/08/12 0439 01/07/12 0500 01/06/12 1520 01/06/12 0230 01/05/12 1600 01/05/12 0420 01/04/12 1528  NA 142 141 135 137 138 137 136 -- --  K 4.2 4.0 4.6 4.4 4.0 3.8 4.2 -- --  CL 105 105 98 101 102 102 101 -- --  CO2 19 24 25 27 26 26 24  -- --  GLUCOSE 141* 84 86 190* 178* 123* 140* -- --  BUN 52* 38* 25* 30* 31* 36* 40* -- --  CREATININE 5.47* 4.24* 2.19* 2.24* 2.79* 3.51* 4.43* -- --  ALB -- -- -- -- -- -- -- -- --  CALCIUM 9.1 8.8 8.8 8.3* 8.0* 7.8* 7.5* -- --  PHOS -- -- 2.8 3.3 3.8 4.1 3.7 4.7* 5.7*   CBC  Lab 01/11/12 0500 01/10/12 0435 01/09/12 0453 01/08/12 0439  WBC 6.5 7.7 6.4 6.3  NEUTROABS -- -- -- --  HGB 10.2* 10.2* 9.7* 10.7*  HCT 30.7* 31.9* 30.1* 33.2*  MCV 79.7 81.8 81.4 81.8  PLT 276 224 157 139*    Medications:       . albuterol  2.5 mg Nebulization Q4H  . antiseptic oral rinse  15 mL Mouth Rinse QID  . chlorhexidine  15 mL Mouth Rinse BID  . insulin aspart  2-6 Units Subcutaneous Q4H  . metoprolol   5 mg Intravenous Q6H  . pantoprazole (PROTONIX) IV  40 mg Intravenous Q24H  . [COMPLETED] warfarin  4 mg Oral NOW  . Warfarin - Pharmacist Dosing Inpatient   Does not apply q1800     Assessment/ Plan:   1. Acute renal failure: timeline of events appear to favor ischemic ATN with cardiorespiratory arrest/renal hypoperfusion injury. Urine output 975cc of urine over the past 24 hours and that was same day as dialysis. S/p intermittent HD treatment yesterday tolerated well.  Has temp HD catheter in right IJ been in for 6 days.  Reasonable UOP,will observe today and check labs in AM to assess for HD need.  Baseline CKD, creatinine in the mid ones it appears  2. Saddle pulmonary embolus: hemodynamically stable and on anti-coagulation with heparin per pharmacy.  3. Elevated LFTs: Likely shock liver- monitor with improving BP  4. S/p cardiorespiratory arrest: Neurology notes reviewed with regards to his altered mental status-plans noted for EEG/MRI. Neurologic status is concerning.  5.  Anemia- reasonable over 10, no aranesp will follow 6. Volume- euvolemic to slightly overloaded.  No diuretics for now 7  Elytes- no significant abnormalities, will follow   Annie Sable, MD 01/11/2012, 9:01 AM

## 2012-01-11 NOTE — Progress Notes (Signed)
EEG completed.

## 2012-01-11 NOTE — Procedures (Signed)
EEG NUMBER:  HISTORY:  A 71 year old male, status post cardiac arrest, now with altered mental status.  MEDICATIONS:  Sublimaze, Apresoline, Lopressor, Coumadin, Protonix.  CONDITIONS OF RECORDING:  This is a 16-channel EEG carried out with the patient in the poorly responsive state.  DESCRIPTION:  The background activity is poorly organized.  There is a mixture of frequencies.  In the past, this posterior background rhythm noted is a 7-8 Hz theta activity.  On a good portion of the tracing, though the posterior background rhythm is actually slower than the 7-8 Hz theta.  The background activity is continuous.  No epileptiform activity is noted.  Hyperventilation was not performed.  Intermittent photic stimulation failed to elicit any change in the tracing.  IMPRESSION:  This is an abnormal EEG secondary to general background slowing.  This finding is consistent with a diffuse disturbance that is etiologically nonspecific, but may include a metabolic encephalopathy among other possibilities.  No epileptiform activity is noted.          ______________________________ Thana Farr, MD    [id number] D:  01/11/2012 15:23:23  T:  01/11/2012 21:23:35  Job #:  [id number]

## 2012-01-11 NOTE — Progress Notes (Signed)
ANTICOAGULATION CONSULT NOTE - Follow Up Consult  Pharmacy Consult for heparin Indication: pulmonary embolus  Labs:  Basename 01/11/12 0500 01/10/12 1500 01/10/12 0435 01/09/12 0600 01/09/12 0453  HGB 10.2* -- 10.2* -- --  HCT 30.7* -- 31.9* -- 30.1*  PLT 276 -- 224 -- 157  APTT -- -- -- -- --  LABPROT 19.4* -- 18.6* -- 16.4*  INR 1.70* -- 1.61* -- 1.35  HEPARINUNFRC 0.22* 0.31 0.25* -- --  CREATININE -- -- 5.47* 4.24* --  CKTOTAL -- -- -- -- --  CKMB -- -- -- -- --  TROPONINI -- -- -- -- --    Assessment: 71yo male now subtherapeutic on heparin despite rate increase yesterday for level at low end of goal; per RN pt has been having gross hematuria x3d at night turning pink each am, H/H low but stable, given PE and subtherapeutic INR and heparin levels would continue anticoag and monitor status closely.  Goal of Therapy:  Heparin level 0.3-0.7 units/ml   Plan:  Will increase heparin by 2 units/kg/hr to 1200 units/hr and check level in 8hr as well as close monitoring of bleeding by RN/Rx.  Colleen Can PharmD BCPS 01/11/2012,5:58 AM

## 2012-01-12 LAB — HEPATIC FUNCTION PANEL
Albumin: 2.2 g/dL — ABNORMAL LOW (ref 3.5–5.2)
Total Protein: 7 g/dL (ref 6.0–8.3)

## 2012-01-12 LAB — RENAL FUNCTION PANEL
Albumin: 2.2 g/dL — ABNORMAL LOW (ref 3.5–5.2)
CO2: 24 mEq/L (ref 19–32)
Chloride: 104 mEq/L (ref 96–112)
GFR calc non Af Amer: 10 mL/min — ABNORMAL LOW (ref >90)
Potassium: 3.7 mEq/L (ref 3.5–5.1)

## 2012-01-12 LAB — HEPARIN LEVEL (UNFRACTIONATED)
Heparin Unfractionated: 0.48 IU/mL (ref 0.30–0.70)
Heparin Unfractionated: 0.55 IU/mL (ref 0.30–0.70)

## 2012-01-12 LAB — PROTIME-INR
INR: 1.69 — ABNORMAL HIGH (ref 0.00–1.49)
Prothrombin Time: 19.3 seconds — ABNORMAL HIGH (ref 11.6–15.2)

## 2012-01-12 LAB — CBC
HCT: 32.2 % — ABNORMAL LOW (ref 39.0–52.0)
Hemoglobin: 10.5 g/dL — ABNORMAL LOW (ref 13.0–17.0)
MCHC: 32.6 g/dL (ref 30.0–36.0)
MCV: 81.9 fL (ref 78.0–100.0)
WBC: 6.3 10*3/uL (ref 4.0–10.5)

## 2012-01-12 MED ORDER — PANTOPRAZOLE SODIUM 40 MG PO PACK
40.0000 mg | PACK | Freq: Every day | ORAL | Status: DC
  Administered 2012-01-12 – 2012-01-21 (×9): 40 mg
  Filled 2012-01-12 (×11): qty 20

## 2012-01-12 MED ORDER — WARFARIN SODIUM 6 MG PO TABS
6.0000 mg | ORAL_TABLET | Freq: Once | ORAL | Status: AC
Start: 2012-01-12 — End: 2012-01-12
  Administered 2012-01-12: 6 mg via ORAL
  Filled 2012-01-12: qty 1

## 2012-01-12 MED ORDER — WARFARIN SODIUM 4 MG PO TABS
4.0000 mg | ORAL_TABLET | Freq: Once | ORAL | Status: DC
  Filled 2012-01-12: qty 1

## 2012-01-12 MED ORDER — OSMOLITE 1.5 CAL PO LIQD
1000.0000 mL | ORAL | Status: DC
  Administered 2012-01-12 – 2012-01-17 (×6): 1000 mL
  Filled 2012-01-12 (×7): qty 1000

## 2012-01-12 NOTE — Progress Notes (Signed)
Patient ID: Noah Torres, male   DOB: October 07, 1940, 71 y.o.   MRN: [id number]   [facility] Progress Note    Subjective:   Having reasonable UOP at 1700 cc last 24 hours.  Last HD was Sunday.  Possibly more alert, trying to cough on demand but no other commands   Objective:   BP 155/88  Pulse 97  Temp 98.9 F (37.2 C) (Oral)  Resp 16  Ht 5\' 10"  (1.778 m)  Wt 84.8 kg (186 lb 15.2 oz)  BMI 26.82 kg/m2  SpO2 98%  Intake/Output Summary (Last 24 hours) at 01/12/12 0849 Last data filed at 01/12/12 0800  Gross per 24 hour  Intake   1799 ml  Output   1900 ml  Net   -101 ml   Weight change: -3.9 kg (-8 lb 9.6 oz)  Physical Exam: Gen: Somnolent and resting bed, opens eyes to voice/touch. No verbal responses. No commands, tried to cough on demand, not clearing secretions CVS: Pulse regular tachycardia, heart sounds S1 and S2 normal Resp: Coarse breath sounds bilaterally-appear transmitted. No rales/rhonchi Abd: Soft, flat, nontender and bowel sounds are normal Ext: 1+ edema over lower extremities  Imaging: Mr Brain Wo Contrast  01/11/2012  *RADIOLOGY REPORT*  Clinical Data: Altered mental status  MRI HEAD WITHOUT CONTRAST  Technique:  Multiplanar, multiecho pulse sequences of the brain and surrounding structures were obtained according to standard protocol without intravenous contrast.  Comparison: CT head 01/08/2012  Findings: Moderate ventricular enlargement is unchanged.  The third and lateral ventricles have a  rounded appearance.  There is generalized atrophy with prominence of the sulci and cortical atrophy.  Ventricular enlargement is most likely due to cerebral atrophy.  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  Mild chronic ischemic changes involving the left caudate and external capsule.  Small chronic infarct left parietal lobe.  No acute infarct.  Negative for hemorrhage or mass lesion.  Chronic sinusitis.  IMPRESSION: Ventricular enlargement,  unchanged.  This is most likely due to global atrophy however communicating hydrocephalus could be present also.  Mild chronic ischemic change.  No acute infarct.   Original Report Authenticated By: Janeece Riggers, M.D.     Labs: BMET  Lab 01/12/12 0420 01/10/12 0435 01/09/12 0600 01/08/12 0439 01/07/12 0500 01/06/12 1520 01/06/12 0230 01/05/12 1600  NA 143 142 141 135 137 138 137 --  K 3.7 4.2 4.0 4.6 4.4 4.0 3.8 --  CL 104 105 105 98 101 102 102 --  CO2 24 19 24 25 27 26 26  --  GLUCOSE 280* 141* 84 86 190* 178* 123* --  BUN 44* 52* 38* 25* 30* 31* 36* --  CREATININE 5.34* 5.47* 4.24* 2.19* 2.24* 2.79* 3.51* --  ALB -- -- -- -- -- -- -- --  CALCIUM 8.8 9.1 8.8 8.8 8.3* 8.0* 7.8* --  PHOS 5.5* -- -- 2.8 3.3 3.8 4.1 3.7   CBC  Lab 01/12/12 0425 01/11/12 0500 01/10/12 0435 01/09/12 0453  WBC 6.3 6.5 7.7 6.4  NEUTROABS -- -- -- --  HGB 10.5* 10.2* 10.2* 9.7*  HCT 32.2* 30.7* 31.9* 30.1*  MCV 81.9 79.7 81.8 81.4  PLT 314 276 224 157    Medications:       . albuterol  2.5 mg Nebulization QID  . antiseptic oral rinse  15 mL Mouth Rinse QID  . chlorhexidine  15 mL Mouth Rinse BID  . metoprolol  5 mg Intravenous Q6H  . pantoprazole (PROTONIX) IV  40 mg Intravenous Q24H  . [  COMPLETED] warfarin  4 mg Oral ONCE-1800  . Warfarin - Pharmacist Dosing Inpatient   Does not apply q1800  . [DISCONTINUED] albuterol  2.5 mg Nebulization Q4H  . [DISCONTINUED] insulin aspart  2-6 Units Subcutaneous Q4H     Assessment/ Plan: 71 year old BM with acute on CRF in the setting of saddle PE and arrest.  He has been HD requiring  1. Acute renal failure: timeline of events appear to favor ischemic ATN with cardiorespiratory arrest/renal hypoperfusion injury. Urine output 1700cc over the past 24 hours. Last ntermittent HD treatment was Sunday, he tolerated well.  Has temp HD catheter in right IJ been in for 7 days.  No indication for HD today, I will continue to monitor.  I am hopeful for improvement.    Baseline CKD, creatinine in the mid ones it appears  2. Saddle pulmonary embolus: hemodynamically stable and on anti-coagulation with heparin per pharmacy.  3. Elevated LFTs: Likely shock liver- monitor with improving BP  4. S/p cardiorespiratory arrest: Neurology notes reviewed with regards to his altered mental status-plans noted for EEG/MRI. Neurologic status is concerning. MRI shows nothing acute.  5.  Anemia- reasonable over 10, no aranesp will follow 6. Volume- euvolemic to slightly overloaded.  No diuretics for now 7 Elytes- no significant abnormalities, will follow   Annie Sable, MD 01/12/2012, 8:49 AM

## 2012-01-12 NOTE — Progress Notes (Addendum)
Subjective: Patient unchanged.  MRI of the brain has been performed and shows no significant abnormalities other than the enlarged ventricles that are felt to be secondary to atrophy.  Likely not contributing to current clinical status.    Objective: Current vital signs: BP 149/87  Pulse 92  Temp 98.9 F (37.2 C) (Oral)  Resp 15  Ht 5\' 10"  (1.778 m)  Wt 84.8 kg (186 lb 15.2 oz)  BMI 26.82 kg/m2  SpO2 100% Vital signs in last 24 hours: Temp:  [98.2 F (36.8 C)-99.2 F (37.3 C)] 98.9 F (37.2 C) (11/26 0732) Pulse Rate:  [87-104] 92  (11/26 0800) Resp:  [10-23] 15  (11/26 0800) BP: (144-165)/(77-92) 149/87 mmHg (11/26 0800) SpO2:  [96 %-100 %] 100 % (11/26 0857) Weight:  [84.8 kg (186 lb 15.2 oz)] 84.8 kg (186 lb 15.2 oz) (11/26 0500)  Intake/Output from previous day: 11/25 0701 - 11/26 0700 In: 1826 [I.V.:252; NG/GT:1574] Out: 1750 [Urine:1750] Intake/Output this shift: Total I/O In: 267 [I.V.:42; NG/GT:225] Out: 351 [Urine:350; Stool:1] Nutritional status: NPO  Neurologic Exam: Mental Status:  Patient does not respond to verbal stimuli. Opens eyes with light tactile stimulation. Says "ouch" when painful stimuli is applied. Does not follow commands.  Cranial Nerves:  II: patient does not respond confrontation bilaterally but seems to attempt to turn away with corneal testing bilaterally, pupils right 4 mm, left 4 mm,and reactive bilaterally  III,IV,VI: Left gaze preference. Unable to get excursion beyond midline with doll's eye maneuvers.  V,VII: corneal reflex present bilaterally  VIII: patient does not respond to verbal stimuli  IX,X: gag reflex not tested, XI: trapezius strength unable to test bilaterally  XII: tongue strength unable to test  Motor:  Some small degree of movement noted in the LUE and LLE. No movement noted on the right.  Sensory:  Does not respond to noxious stimuli with an "ouch" to painful stimulation on the right but does not respond to painful  stimulation on the left.  Deep Tendon Reflexes:  2+ with absent AJ's bilaterally  Plantars:  Triple reflex response noted when plantar responses elicited bilaterally  Cerebellar:  Unable to perform   Lab Results: Basic Metabolic Panel:  Lab 01/12/12 1610 01/10/12 0435 01/09/12 0600 01/08/12 0439 01/07/12 0500 01/06/12 1520 01/06/12 0230  NA 143 142 141 135 137 -- --  K 3.7 4.2 4.0 4.6 4.4 -- --  CL 104 105 105 98 101 -- --  CO2 24 19 24 25 27  -- --  GLUCOSE 280* 141* 84 86 190* -- --  BUN 44* 52* 38* 25* 30* -- --  CREATININE 5.34* 5.47* 4.24* 2.19* 2.24* -- --  CALCIUM 8.8 9.1 8.8 -- -- -- --  MG -- -- -- 2.6* 2.6* -- 2.4  PHOS 5.5* -- -- 2.8 3.3 3.8 4.1    Liver Function Tests:  Lab 01/12/12 0425 01/12/12 0420 01/10/12 1152 01/09/12 0600 01/08/12 1220 01/08/12 0439  AST 44* -- -- 67* 80* --  ALT 85* -- 125* 165* 198* --  ALKPHOS 120* -- -- 118* 123* --  BILITOT 0.5 -- -- 0.8 0.7 --  PROT 7.0 -- -- 6.8 7.0 --  ALBUMIN 2.2* 2.2* -- 2.4* 2.4* 2.6*   No results found for this basename: LIPASE:5,AMYLASE:5 in the last 168 hours No results found for this basename: AMMONIA:3 in the last 168 hours  CBC:  Lab 01/12/12 0425 01/11/12 0500 01/10/12 0435 01/09/12 0453 01/08/12 0439  WBC 6.3 6.5 7.7 6.4 6.3  NEUTROABS -- -- -- -- --  HGB 10.5* 10.2* 10.2* 9.7* 10.7*  HCT 32.2* 30.7* 31.9* 30.1* 33.2*  MCV 81.9 79.7 81.8 81.4 81.8  PLT 314 276 224 157 139*    Cardiac Enzymes: No results found for this basename: CKTOTAL:5,CKMB:5,CKMBINDEX:5,TROPONINI:5 in the last 168 hours  Lipid Panel:  Lab 01/07/12 0500  CHOL --  TRIG 296*  HDL --  CHOLHDL --  VLDL --  LDLCALC --    CBG:  Lab 01/15/2012 1602 January 15, 2012 1212 01-15-2012 0836 January 15, 2012 0416 January 15, 2012  GLUCAP 106* 151* 205* 134* 143*    Microbiology: Results for orders placed during the hospital encounter of 01/04/12  MRSA PCR SCREENING     Status: Normal   Collection Time   01/04/12  3:06 PM      Component Value  Range Status Comment   MRSA by PCR NEGATIVE  NEGATIVE Final   CULTURE, RESPIRATORY     Status: Normal   Collection Time   01/04/12  3:14 PM      Component Value Range Status Comment   Specimen Description TRACHEAL ASPIRATE   Final    Special Requests Normal   Final    Gram Stain     Final    Value: FEW WBC PRESENT,BOTH PMN AND MONONUCLEAR     RARE SQUAMOUS EPITHELIAL CELLS PRESENT     FEW GRAM POSITIVE RODS     FEW GRAM POSITIVE COCCI IN PAIRS     RARE YEAST   Culture MODERATE PSEUDOMONAS AERUGINOSA   Final    Report Status 01/07/2012 FINAL   Final    Organism ID, Bacteria PSEUDOMONAS AERUGINOSA   Final     Coagulation Studies:  Basename 01/12/12 0425 2012/01/15 0500 01/10/12 0435  LABPROT 19.3* 19.4* 18.6*  INR 1.69* 1.70* 1.61*    Imaging: Mr Brain Wo Contrast  Jan 15, 2012  *RADIOLOGY REPORT*  Clinical Data: Altered mental status  MRI HEAD WITHOUT CONTRAST  Technique:  Multiplanar, multiecho pulse sequences of the brain and surrounding structures were obtained according to standard protocol without intravenous contrast.  Comparison: CT head 01/08/2012  Findings: Moderate ventricular enlargement is unchanged.  The third and lateral ventricles have a  rounded appearance.  There is generalized atrophy with prominence of the sulci and cortical atrophy.  Ventricular enlargement is most likely due to cerebral atrophy.  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  Mild chronic ischemic changes involving the left caudate and external capsule.  Small chronic infarct left parietal lobe.  No acute infarct.  Negative for hemorrhage or mass lesion.  Chronic sinusitis.  IMPRESSION: Ventricular enlargement, unchanged.  This is most likely due to global atrophy however communicating hydrocephalus could be present also.  Mild chronic ischemic change.  No acute infarct.   Original Report Authenticated By: Janeece Riggers, M.D.     Medications:  I have reviewed the patient's current  medications. Scheduled:   . albuterol  2.5 mg Nebulization QID  . antiseptic oral rinse  15 mL Mouth Rinse QID  . chlorhexidine  15 mL Mouth Rinse BID  . metoprolol  5 mg Intravenous Q6H  . pantoprazole sodium  40 mg Per Tube Daily  . [COMPLETED] warfarin  4 mg Oral ONCE-1800  . Warfarin - Pharmacist Dosing Inpatient   Does not apply q1800  . [DISCONTINUED] albuterol  2.5 mg Nebulization Q4H  . [DISCONTINUED] insulin aspart  2-6 Units Subcutaneous Q4H  . [DISCONTINUED] pantoprazole (PROTONIX) IV  40 mg Intravenous Q24H    Assessment/Plan:  Patient Active Hospital Problem List:  Encephalopathy (01/09/2012)  Assessment: Based on examination it was felt that the patient had experienced some intracerebral injury as a consequence of the arrest due to focal areas of ischemia.  Imaging does not support this.  Exam clearly suggest that some injury occurred though.  EEG shows no evidence of subclinical seizure activity.  Unclear how much improvement the patient will undergo.  He will likely progress somewhat beyond this state but likely not significantly.  There is no specific treatment or intervention to offer other than therapy     Plan: PT/OT  Case discussed with Dr. Butler Denmark   LOS: 8 days   Thana Farr, MD [facility] [phone] 01/12/2012  11:59 AM

## 2012-01-12 NOTE — Progress Notes (Signed)
ANTICOAGULATION CONSULT NOTE - Follow Up Consult  Pharmacy Consult for Heparin Indication: pulmonary embolus  Allergies  Allergen Reactions  . Metformin And Related     Patient Measurements: Height: 5\' 10"  (177.8 cm) Weight: 186 lb 15.2 oz (84.8 kg) IBW/kg (Calculated) : 73   Vital Signs: Temp: 98.9 F (37.2 C) (11/26 1941) Temp src: Axillary (11/26 1941) BP: 165/68 mmHg (11/26 2000) Pulse Rate: 102  (11/26 2000)  Labs:  Basename 01/12/12 2008 01/12/12 1333 01/12/12 0425 01/12/12 0420 01/11/12 0500 01/10/12 0435  HGB -- -- 10.5* -- 10.2* --  HCT -- -- 32.2* -- 30.7* 31.9*  PLT -- -- 314 -- 276 224  APTT -- -- -- -- -- --  LABPROT -- -- 19.3* -- 19.4* 18.6*  INR -- -- 1.69* -- 1.70* 1.61*  HEPARINUNFRC 0.55 0.48 0.20* -- -- --  CREATININE -- -- -- 5.34* -- 5.47*  CKTOTAL -- -- -- -- -- --  CKMB -- -- -- -- -- --  TROPONINI -- -- -- -- -- --    Estimated Creatinine Clearance: 13.3 ml/min (by C-G formula based on Cr of 5.34).   Medications:  Scheduled:     . albuterol  2.5 mg Nebulization QID  . antiseptic oral rinse  15 mL Mouth Rinse QID  . chlorhexidine  15 mL Mouth Rinse BID  . metoprolol  5 mg Intravenous Q6H  . pantoprazole sodium  40 mg Per Tube Daily  . [COMPLETED] warfarin  6 mg Oral ONCE-1800  . Warfarin - Pharmacist Dosing Inpatient   Does not apply q1800  . [DISCONTINUED] pantoprazole (PROTONIX) IV  40 mg Intravenous Q24H  . [DISCONTINUED] warfarin  4 mg Oral ONCE-1800      . feeding supplement (OSMOLITE 1.5 CAL) 1,000 mL (01/12/12 1205)  . heparin 1,400 Units/hr (01/12/12 1948)  . [DISCONTINUED] feeding supplement (OSMOLITE 1.2 CAL) 1,000 mL (01/12/12 0019)    Assessment: Saddle PE:  Continuing Heparin bridging to Coumadin.  Heparin level is therapeutic x 2 after rate adjustment Goal of Therapy:  INR 2-3 Heparin level 0.3-0.7 units/ml Monitor platelets by anticoagulation protocol: Yes   Plan:  Continue Heparin at 1400 units/hr Follow up  AM heparin level  Thank you. Okey Regal, PharmD [phone] 01/12/2012, 8:49 PM

## 2012-01-12 NOTE — Progress Notes (Signed)
ANTICOAGULATION CONSULT NOTE - Follow Up Consult  Pharmacy Consult for Heparin/Coumadin Indication: pulmonary embolus  Allergies  Allergen Reactions  . Metformin And Related     Patient Measurements: Height: 5\' 10"  (177.8 cm) Weight: 186 lb 15.2 oz (84.8 kg) IBW/kg (Calculated) : 73   Vital Signs: Temp: 99.3 F (37.4 C) (11/26 1237) Temp src: Oral (11/26 1237) BP: 167/84 mmHg (11/26 1100) Pulse Rate: 85  (11/26 1100)  Labs:  Basename 01/12/12 1333 01/12/12 0425 01/12/12 0420 01/11/12 1610 01/11/12 0500 01/10/12 0435  HGB -- 10.5* -- -- 10.2* --  HCT -- 32.2* -- -- 30.7* 31.9*  PLT -- 314 -- -- 276 224  APTT -- -- -- -- -- --  LABPROT -- 19.3* -- -- 19.4* 18.6*  INR -- 1.69* -- -- 1.70* 1.61*  HEPARINUNFRC 0.48 0.20* -- 0.44 -- --  CREATININE -- -- 5.34* -- -- 5.47*  CKTOTAL -- -- -- -- -- --  CKMB -- -- -- -- -- --  TROPONINI -- -- -- -- -- --    Estimated Creatinine Clearance: 13.3 ml/min (by C-G formula based on Cr of 5.34).   Medications:  Scheduled:    . albuterol  2.5 mg Nebulization QID  . antiseptic oral rinse  15 mL Mouth Rinse QID  . chlorhexidine  15 mL Mouth Rinse BID  . metoprolol  5 mg Intravenous Q6H  . pantoprazole sodium  40 mg Per Tube Daily  . [COMPLETED] warfarin  4 mg Oral ONCE-1800  . Warfarin - Pharmacist Dosing Inpatient   Does not apply q1800  . [DISCONTINUED] albuterol  2.5 mg Nebulization Q4H  . [DISCONTINUED] insulin aspart  2-6 Units Subcutaneous Q4H  . [DISCONTINUED] pantoprazole (PROTONIX) IV  40 mg Intravenous Q24H      . feeding supplement (OSMOLITE 1.5 CAL) 1,000 mL (01/12/12 1205)  . heparin 1,400 Units/hr (01/12/12 1000)  . [DISCONTINUED] feeding supplement (OSMOLITE 1.2 CAL) 1,000 mL (01/12/12 0019)    Assessment: Saddle PE:  Continuing Heparin bridging to Coumadin.  Heparin level is therapeutic after rate adjustment, INR has now leveled out and is slightly subtherapeutic.  Goal of Therapy:  INR 2-3 Heparin level  0.3-0.7 units/ml Monitor platelets by anticoagulation protocol: Yes   Plan:  Continue Heparin at 1400 units/hr Recheck Heparin level in 6 hours to confirm Increase Coumadin to 6mg  today Daily PT/INR  Estella Husk, Pharm.D., BCPS Clinical Pharmacist  Phone [phone] Pager [phone] 01/12/2012, 2:54 PM

## 2012-01-12 NOTE — Progress Notes (Signed)
Nutrition Follow-Up  Intervention: 1. To better meet nutritional needs, will change formula to Osmolite 1.5 once current bottle of Osmolite 1.2 is completed. Initiate Osmolite 1.5 @ goal rate of 60 ml/hr. This regimen will provide: 2160 kcal, 90 grams protein, 1098 ml free water, and 1440 mg phosphorus. 2. RD to continue to follow nutrition care plan  Assessment:   Pt received HD on 11/24, 2.9 L off. Per nephrology, pt to be assessed for HD need daily.  Noted phosphorus is trending up. Discussed changing formula to more concentrated formula with reduced phosphorus content with RN.   Diet Order:  NPO Enteral Nutrition: Osmolite 1.2 at 75 ml/hr - provides 2160 kcal, 100 grams of protein, 1476 ml free water, 2160 mg phosphorus. No free water currently ordered.  Meds: Scheduled Meds:    . albuterol  2.5 mg Nebulization QID  . antiseptic oral rinse  15 mL Mouth Rinse QID  . chlorhexidine  15 mL Mouth Rinse BID  . metoprolol  5 mg Intravenous Q6H  . pantoprazole (PROTONIX) IV  40 mg Intravenous Q24H  . [COMPLETED] warfarin  4 mg Oral ONCE-1800  . Warfarin - Pharmacist Dosing Inpatient   Does not apply q1800  . [DISCONTINUED] albuterol  2.5 mg Nebulization Q4H  . [DISCONTINUED] insulin aspart  2-6 Units Subcutaneous Q4H   Continuous Infusions:    . feeding supplement (OSMOLITE 1.2 CAL) 1,000 mL (01/12/12 0019)  . heparin 1,400 Units/hr (01/12/12 0547)   PRN Meds:.sodium chloride, bisacodyl, fentaNYL, heparin, heparin, hydrALAZINE, [DISCONTINUED] sodium chloride, [DISCONTINUED] sodium chloride, [DISCONTINUED] sodium chloride, [DISCONTINUED] feeding supplement (NEPRO CARB STEADY), [DISCONTINUED] lidocaine, [DISCONTINUED] lidocaine-prilocaine, [DISCONTINUED] pentafluoroprop-tetrafluoroeth   CMP     Component Value Date/Time   NA 143 01/12/2012 0420   K 3.7 01/12/2012 0420   CL 104 01/12/2012 0420   CO2 24 01/12/2012 0420   GLUCOSE 280* 01/12/2012 0420   BUN 44* 01/12/2012 0420   CREATININE 5.34* 01/12/2012 0420   CALCIUM 8.8 01/12/2012 0420   PROT 7.0 01/12/2012 0425   ALBUMIN 2.2* 01/12/2012 0425   AST 44* 01/12/2012 0425   ALT 85* 01/12/2012 0425   ALKPHOS 120* 01/12/2012 0425   BILITOT 0.5 01/12/2012 0425   GFRNONAA 10* 01/12/2012 0420   GFRAA 11* 01/12/2012 0420   CBG (last 3)   Basename 01/11/12 1602 01/11/12 1212 01/11/12 0836  GLUCAP 106* 151* 205*   Phosphorus  Date/Time Value Range Status  01/12/2012  4:20 AM 5.5* 2.3 - 4.6 mg/dL Final  16/11/9602  5:40 AM 2.8  2.3 - 4.6 mg/dL Final  98/12/9145  8:29 AM 3.3  2.3 - 4.6 mg/dL Final    Intake/Output Summary (Last 24 hours) at 01/12/12 0925 Last data filed at 01/12/12 0900  Gross per 24 hour  Intake   1712 ml  Output   1900 ml  Net   -188 ml  BM: 11/24  Weight Status:  250 lb/113.8 kg on admission due to fluid 84.8 kg 11/26 - trending down with fluid removal 91.4 kg 11/23 90.8 kg 11/22  Re-estimated needs:  2050-2250 kcals, 95-105 gm protein, 2.1-2.3 liters fluid daily  Nutrition Dx:  Inadequate oral intake related to inability to eat as evidenced by NPO status, ongoing.  Goal:  Intake to meet >90% of estimated nutrition needs. Met  Monitor:  TF tolerance, labs, weight trend, renal labs  Jarold Motto MS, RD, LDN Pager: [phone] After-hours pager: [phone]

## 2012-01-12 NOTE — Progress Notes (Signed)
ANTICOAGULATION CONSULT NOTE - Follow Up Consult  Pharmacy Consult for heparin Indication: pulmonary embolus  Labs:  Basename 01/12/12 0425 01/11/12 1610 01/11/12 0500 01/10/12 0435 01/09/12 0600  HGB 10.5* -- 10.2* -- --  HCT 32.2* -- 30.7* 31.9* --  PLT 314 -- 276 224 --  APTT -- -- -- -- --  LABPROT 19.3* -- 19.4* 18.6* --  INR 1.69* -- 1.70* 1.61* --  HEPARINUNFRC 0.20* 0.44 0.22* -- --  CREATININE -- -- -- 5.47* 4.24*  CKTOTAL -- -- -- -- --  CKMB -- -- -- -- --  TROPONINI -- -- -- -- --    Assessment: 71yo male now subtherapeutic on heparin after one level at goal after rate increase, H/H low but remains stable, per RN hematuria has mostly resolved now with amber-colored urine.  Goal of Therapy:  Heparin level 0.3-0.7 units/ml   Plan:  Will increase heparin by 2 units/kg/hr to 1400 units/hr and check level in 8hr as well as close monitoring of bleeding by RN/Rx.  Colleen Can PharmD BCPS 01/12/2012,5:38 AM

## 2012-01-12 NOTE — Progress Notes (Signed)
Nurse contacted MD regarding pt BP trend remaining high (systolic 150's-160's) even though lower than PRN Hydralazine parameters.  Additionally, pt low grade fever during last 2 vital checks.  MD acknowledged concern and no interventions are required at this time.  Nurse will continue to monitor pt.

## 2012-01-12 NOTE — Progress Notes (Signed)
[facility] [place] TEAM 1 - Stepdown/ICU TEAM  Noah Torres VHQ:[id number] DOB: October 06, 1940 DOA: 01/04/2012 PCP: No primary provider on file.  HPI: Noah Torres is an 71 y.o. male who was admitted at [facility] due to SOB and was found to have a PE and DVT. While hospitalized patient developed complications of ARF, acidosis and shock requiring intubation. After intubation went into PEA arrest, was given TPA and required resuscitation for about 7 minutes before ROSC was obtained. Patient was transferred here due to renal deterioration and need for dialysis. Has had improvement in renal status and has been extubated but has not returned to baseline mental status. In work up, CT of the head was performed that shows evidence of small vessel ischemic changes and large ventricles suggestive of NPH.   Tests / Events:  11/11 - Saddle PE noted on CT-A, started anticoagulation  11/12 - Echo shows PA pressure 58-60; Doppler shows LLE DVT  11/13 - Develops shock started on pressors, develops resp failure intubated, cardiac arrest - given TPA and resuscitated  11/14 - AKI (Creat increa 1.5-->2.7)  11/16 - GRN in sputum, concern for BLL PNA, start on rocephin  11/17 - stopped all pressors  11/18 - transferred to [facility] and started on CRRT  11/20 - Multiple episodes of bradycardia/hypotension 2/2 precedex use, so med stopped  11/21 - Successfully extubated, no improvement in mental status  11/22 - CT shows no stroke, possible normal pressure hydrocephalus   Assessment/Plan:  LLE DVT w/ Saddle Pulmonary Embolus w/ cardio-respiratory collapse Required TPA at outside hospital - cont heparin and Coumadin per pharmacy  Acute renal failure / ATN on CKD (baseline crt 1.9) Ischemic ATN related to cardiac arrest - Nephrology is following - intermittently being dialyzed   Encephalopathy - ? NPH - EEG and MRI complete- neurology suspects that his encephalopathy is coming from cardiac arrest-  his sister and her husband are in the see him today and state that he was able to [facility] their names before falling asleep again-  - suspect he will need a PEG if lethargy continues- I did discuss this with family present today-  daughter to come back tomorrow- this can be discussed with her.   transaminitis / shock liver - LFTs slowly improving  Tachycardia  Due to PE- on metoprolol  HTN Reasonably controlled on current regimen- IV Metoprolol  DM 2 Sensitive sliding scale q 6 hrs while on tube feeds  S/P Cardiac arrest (PEA) at outside hospital  Severe protein calorie malnutrition Continue tube feeds  Reported GNR on sputum at OSH, s/p Rocephin x 3 days Restart antibiotics if persistant fever - low grade fever this morning.   Code Status: full code Family Communication: none Disposition Plan: SDU DVT prophylaxis: Heparin infusion  Consultants:  Nephro  Neuro   Antibiotics:  Ceftriaxone 11/16>>>11/18  HPI/Subjective: The patient is asleep awakens - able to mumble his brother in law's name after being pressed- quite lethargic otherwise.   Objective: Filed Vitals:   01/12/12 0857 01/12/12 1100 01/12/12 1237 01/12/12 1331  BP:  167/84    Pulse:  85    Temp:  100.3 F (37.9 C) 99.3 F (37.4 C)   TempSrc:  Axillary Oral   Resp:  16    Height:      Weight:      SpO2: 100% 97%  99%    Intake/Output Summary (Last 24 hours) at 01/12/12 1515 Last data filed at 01/12/12 1300  Gross per 24 hour  Intake   1397 ml  Output   2101 ml  Net   -704 ml    Exam:  General: No acute respiratory distress Lungs: Bibasilar crackles with good air movement throughout other fields with no wheeze Cardiovascular: Tachycardic but regular without appreciable gallop rub or murmur Abdomen: Nontender, nondistended, soft, bowel sounds positive, no rebound, no ascites, no appreciable mass Extremities: No significant cyanosis, clubbing, or edema bilateral lower extremities  Data  Reviewed: Basic Metabolic Panel:  Lab 01/12/12 1610 01/10/12 0435 01/09/12 0600 01/08/12 0439 01/07/12 0500 01/06/12 1520 01/06/12 0230  NA 143 142 141 135 137 -- --  K 3.7 4.2 4.0 4.6 4.4 -- --  CL 104 105 105 98 101 -- --  CO2 24 19 24 25 27  -- --  GLUCOSE 280* 141* 84 86 190* -- --  BUN 44* 52* 38* 25* 30* -- --  CREATININE 5.34* 5.47* 4.24* 2.19* 2.24* -- --  CALCIUM 8.8 9.1 8.8 8.8 8.3* -- --  MG -- -- -- 2.6* 2.6* -- 2.4  PHOS 5.5* -- -- 2.8 3.3 3.8 4.1   Liver Function Tests:  Lab 01/12/12 0425 01/12/12 0420 01/10/12 1152 01/09/12 0600 01/08/12 1220 01/08/12 0439  AST 44* -- -- 67* 80* --  ALT 85* -- 125* 165* 198* --  ALKPHOS 120* -- -- 118* 123* --  BILITOT 0.5 -- -- 0.8 0.7 --  PROT 7.0 -- -- 6.8 7.0 --  ALBUMIN 2.2* 2.2* -- 2.4* 2.4* 2.6*   CBC:  Lab 01/12/12 0425 01/11/12 0500 01/10/12 0435 01/09/12 0453 01/08/12 0439  WBC 6.3 6.5 7.7 6.4 6.3  NEUTROABS -- -- -- -- --  HGB 10.5* 10.2* 10.2* 9.7* 10.7*  HCT 32.2* 30.7* 31.9* 30.1* 33.2*  MCV 81.9 79.7 81.8 81.4 81.8  PLT 314 276 224 157 139*   CBG:  Lab 01/11/12 1602 01/11/12 1212 01/11/12 0836 01/11/12 0416 01/11/12  GLUCAP 106* 151* 205* 134* 143*    Recent Results (from the past 240 hour(s))  MRSA PCR SCREENING     Status: Normal   Collection Time   01/04/12  3:06 PM      Component Value Range Status Comment   MRSA by PCR NEGATIVE  NEGATIVE Final   CULTURE, RESPIRATORY     Status: Normal   Collection Time   01/04/12  3:14 PM      Component Value Range Status Comment   Specimen Description TRACHEAL ASPIRATE   Final    Special Requests Normal   Final    Gram Stain     Final    Value: FEW WBC PRESENT,BOTH PMN AND MONONUCLEAR     RARE SQUAMOUS EPITHELIAL CELLS PRESENT     FEW GRAM POSITIVE RODS     FEW GRAM POSITIVE COCCI IN PAIRS     RARE YEAST   Culture MODERATE PSEUDOMONAS AERUGINOSA   Final    Report Status 01/07/2012 FINAL   Final    Organism ID, Bacteria PSEUDOMONAS AERUGINOSA   Final        Studies: Ct Head Wo Contrast  01/08/2012  *RADIOLOGY REPORT*  Clinical Data: Altered mental status.  TPA for pulmonary embolism.  CT HEAD WITHOUT CONTRAST  Technique:  Contiguous axial images were obtained from the base of the skull through the vertex without contrast.  Comparison: None.  IMPRESSION: Ventricular enlargement out of portion to cortical atrophy. Question normal pressure hydrocephalus.  No acute stroke or bleed.   Original Report Authenticated By: Davonna Belling, M.D.    US  Renal Port  01/04/2012  *RADIOLOGY REPORT*  Clinical Data: Acute renal failure.  RENAL/URINARY TRACT ULTRASOUND COMPLETE  Comparison:  None  IMPRESSION:  Slight increased echogenicity of both kidneys but no hydronephrosis.   Original Report Authenticated By: Rudie Meyer, M.D.     Scheduled Meds:    . albuterol  2.5 mg Nebulization QID  . antiseptic oral rinse  15 mL Mouth Rinse QID  . chlorhexidine  15 mL Mouth Rinse BID  . metoprolol  5 mg Intravenous Q6H  . pantoprazole sodium  40 mg Per Tube Daily  . [COMPLETED] warfarin  4 mg Oral ONCE-1800  . warfarin  6 mg Oral ONCE-1800  . Warfarin - Pharmacist Dosing Inpatient   Does not apply q1800  . [DISCONTINUED] albuterol  2.5 mg Nebulization Q4H  . [DISCONTINUED] insulin aspart  2-6 Units Subcutaneous Q4H  . [DISCONTINUED] pantoprazole (PROTONIX) IV  40 mg Intravenous Q24H  . [DISCONTINUED] warfarin  4 mg Oral ONCE-1800   Continuous Infusions:    . feeding supplement (OSMOLITE 1.5 CAL) 1,000 mL (01/12/12 1205)  . heparin 1,400 Units/hr (01/12/12 1000)  . [DISCONTINUED] feeding supplement (OSMOLITE 1.2 CAL) 1,000 mL (01/12/12 0019)    _______________________________________________________________________  Time spent: 35 min  Lonia Blood, MD [facility] Office  [phone] Pager [phone]  On-Call/Text Page:      [email or web address]      password [facility]  01/12/2012, 3:15 PM  LOS: 8 days

## 2012-01-13 ENCOUNTER — Inpatient Hospital Stay (HOSPITAL_COMMUNITY): Payer: Medicare Other

## 2012-01-13 LAB — CBC
HCT: 33 % — ABNORMAL LOW (ref 39.0–52.0)
MCHC: 31.8 g/dL (ref 30.0–36.0)
RDW: 18.1 % — ABNORMAL HIGH (ref 11.5–15.5)

## 2012-01-13 LAB — GLUCOSE, CAPILLARY

## 2012-01-13 LAB — RENAL FUNCTION PANEL
Albumin: 2.3 g/dL — ABNORMAL LOW (ref 3.5–5.2)
BUN: 62 mg/dL — ABNORMAL HIGH (ref 6–23)
Chloride: 106 mEq/L (ref 96–112)
Creatinine, Ser: 6.34 mg/dL — ABNORMAL HIGH (ref 0.50–1.35)

## 2012-01-13 LAB — PROTIME-INR: Prothrombin Time: 20.3 seconds — ABNORMAL HIGH (ref 11.6–15.2)

## 2012-01-13 MED ORDER — INSULIN ASPART 100 UNIT/ML ~~LOC~~ SOLN
20.0000 [IU] | Freq: Once | SUBCUTANEOUS | Status: AC
  Administered 2012-01-13: 20 [IU] via SUBCUTANEOUS

## 2012-01-13 MED ORDER — HEPARIN SODIUM (PORCINE) 1000 UNIT/ML DIALYSIS: 1000.0000 [IU] | INTRAMUSCULAR | Status: DC | PRN

## 2012-01-13 MED ORDER — SODIUM CHLORIDE 0.9 % IV SOLN: 100.0000 mL | INTRAVENOUS | Status: DC | PRN

## 2012-01-13 MED ORDER — INSULIN ASPART 100 UNIT/ML ~~LOC~~ SOLN
0.0000 [IU] | SUBCUTANEOUS | Status: DC
  Administered 2012-01-13: 5 [IU] via SUBCUTANEOUS
  Administered 2012-01-13: 11 [IU] via SUBCUTANEOUS
  Administered 2012-01-14 (×2): 8 [IU] via SUBCUTANEOUS
  Administered 2012-01-14: 3 [IU] via SUBCUTANEOUS
  Administered 2012-01-14: 5 [IU] via SUBCUTANEOUS
  Administered 2012-01-14 – 2012-01-15 (×2): 8 [IU] via SUBCUTANEOUS
  Administered 2012-01-15 (×2): 5 [IU] via SUBCUTANEOUS
  Administered 2012-01-15 (×2): 8 [IU] via SUBCUTANEOUS
  Administered 2012-01-15: 3 [IU] via SUBCUTANEOUS
  Administered 2012-01-16 (×2): 8 [IU] via SUBCUTANEOUS
  Administered 2012-01-16: 5 [IU] via SUBCUTANEOUS
  Administered 2012-01-16: 3 [IU] via SUBCUTANEOUS
  Administered 2012-01-16: 5 [IU] via SUBCUTANEOUS
  Administered 2012-01-16: 8 [IU] via SUBCUTANEOUS
  Administered 2012-01-17 (×2): 3 [IU] via SUBCUTANEOUS
  Administered 2012-01-17: 8 [IU] via SUBCUTANEOUS
  Administered 2012-01-17: 3 [IU] via SUBCUTANEOUS
  Administered 2012-01-17: 5 [IU] via SUBCUTANEOUS
  Administered 2012-01-17: 8 [IU] via SUBCUTANEOUS
  Administered 2012-01-18: 2 [IU] via SUBCUTANEOUS
  Administered 2012-01-18 (×2): 3 [IU] via SUBCUTANEOUS
  Administered 2012-01-19 – 2012-01-21 (×2): 2 [IU] via SUBCUTANEOUS
  Administered 2012-01-21: 5 [IU] via SUBCUTANEOUS
  Administered 2012-01-21: 3 [IU] via SUBCUTANEOUS

## 2012-01-13 MED ORDER — NEPRO/CARBSTEADY PO LIQD
237.0000 mL | ORAL | Status: DC | PRN
  Filled 2012-01-13: qty 237

## 2012-01-13 MED ORDER — DEXTROSE 50 % IV SOLN
25.0000 mL | Freq: Once | INTRAVENOUS | Status: AC | PRN
  Administered 2012-01-13: 25 mL via INTRAVENOUS

## 2012-01-13 MED ORDER — PENTAFLUOROPROP-TETRAFLUOROETH EX AERO: 1.0000 "application " | INHALATION_SPRAY | CUTANEOUS | Status: DC | PRN

## 2012-01-13 MED ORDER — DEXTROSE 50 % IV SOLN
INTRAVENOUS | Status: AC
  Administered 2012-01-13: 25 mL via INTRAVENOUS
  Filled 2012-01-13: qty 50

## 2012-01-13 MED ORDER — CARVEDILOL 6.25 MG PO TABS
6.2500 mg | ORAL_TABLET | Freq: Two times a day (BID) | ORAL | Status: DC
  Administered 2012-01-14 – 2012-01-17 (×7): 6.25 mg
  Filled 2012-01-13 (×11): qty 1

## 2012-01-13 MED ORDER — WARFARIN SODIUM 6 MG PO TABS
6.0000 mg | ORAL_TABLET | Freq: Once | ORAL | Status: AC
  Administered 2012-01-14: 6 mg
  Filled 2012-01-13: qty 1

## 2012-01-13 MED ORDER — LIDOCAINE-PRILOCAINE 2.5-2.5 % EX CREA: 1.0000 "application " | TOPICAL_CREAM | CUTANEOUS | Status: DC | PRN

## 2012-01-13 MED ORDER — ALTEPLASE 2 MG IJ SOLR
2.0000 mg | Freq: Once | INTRAMUSCULAR | Status: DC | PRN
  Filled 2012-01-13: qty 2

## 2012-01-13 MED ORDER — LIDOCAINE HCL (PF) 1 % IJ SOLN: 5.0000 mL | INTRAMUSCULAR | Status: DC | PRN

## 2012-01-13 MED ORDER — HEPARIN SODIUM (PORCINE) 1000 UNIT/ML DIALYSIS: 20.0000 [IU]/kg | INTRAMUSCULAR | Status: DC | PRN

## 2012-01-13 MED ORDER — INSULIN GLARGINE 100 UNIT/ML ~~LOC~~ SOLN
20.0000 [IU] | Freq: Every day | SUBCUTANEOUS | Status: DC
  Administered 2012-01-13 – 2012-01-14 (×2): 20 [IU] via SUBCUTANEOUS

## 2012-01-13 MED ORDER — OXYCODONE HCL 5 MG PO TABS
5.0000 mg | ORAL_TABLET | ORAL | Status: DC | PRN
  Administered 2012-01-13: 5 mg
  Filled 2012-01-13: qty 1

## 2012-01-13 MED ORDER — WARFARIN SODIUM 6 MG PO TABS
6.0000 mg | ORAL_TABLET | Freq: Once | ORAL | Status: DC
Start: 2012-01-13 — End: 2012-01-13
  Filled 2012-01-13: qty 1

## 2012-01-13 MED ORDER — INSULIN GLARGINE 100 UNIT/ML ~~LOC~~ SOLN: 20.0000 [IU] | Freq: Every day | SUBCUTANEOUS | Status: DC

## 2012-01-13 MED ORDER — DEXTROSE 50 % IV SOLN: 25.0000 mL | Freq: Once | INTRAVENOUS | Status: AC | PRN

## 2012-01-13 NOTE — Progress Notes (Signed)
Nurse paged MD to inform pt BS at [phone].  Nurse will continue to monitor

## 2012-01-13 NOTE — Progress Notes (Signed)
ANTICOAGULATION CONSULT NOTE - Follow Up Consult  Pharmacy Consult for Heparin Indication: pulmonary embolus  Allergies  Allergen Reactions  . Metformin And Related     Patient Measurements: Height: 5\' 10"  (177.8 cm) Weight: 181 lb 7 oz (82.3 kg) IBW/kg (Calculated) : 73   Vital Signs: Temp: 99 F (37.2 C) (11/27 0748) Temp src: Axillary (11/27 0748) BP: 141/88 mmHg (11/27 0748) Pulse Rate: 107  (11/27 0748)  Labs:  Noah Torres 01/13/12 0455 01/12/12 2008 01/12/12 1333 01/12/12 0425 01/12/12 0420 01/11/12 0500  HGB 10.5* -- -- 10.5* -- --  HCT 33.0* -- -- 32.2* -- 30.7*  PLT 401* -- -- 314 -- 276  APTT -- -- -- -- -- --  LABPROT 20.3* -- -- 19.3* -- 19.4*  INR 1.81* -- -- 1.69* -- 1.70*  HEPARINUNFRC 0.50 0.55 0.48 -- -- --  CREATININE 6.34* -- -- -- 5.34* --  CKTOTAL -- -- -- -- -- --  CKMB -- -- -- -- -- --  TROPONINI -- -- -- -- -- --    Estimated Creatinine Clearance: 11.2 ml/min (by C-G formula based on Cr of 6.34).    Assessment: Saddle PE:  Continuing Heparin bridging to Coumadin.  Heparin level is therapeutic at 0.5.  INR rising to 1.81  Goal of Therapy:  INR 2-3 Heparin level 0.3-0.7 units/ml Monitor platelets by anticoagulation protocol: Yes   Plan:  Continue Heparin at 1400 units/hr Coumadin 6mg  x 1 dose today Follow up AM heparin level, CBC, protime  Celedonio Miyamoto, PharmD, BCPS Clinical Pharmacist Pager [phone]  01/13/2012, 8:06 AM

## 2012-01-13 NOTE — Progress Notes (Addendum)
Pt transported to HD with baseline mental status.  Pt was transported with Heparin IVF infusing and Osmolite 1.5 tube feed infusing.  Report was given to HD nurse including pt recent hypoglycemic episode, lopressor 1200 dose was held due to scheduled HD.   Vital signs prior to transport were documented in doc flow sheets.

## 2012-01-13 NOTE — Progress Notes (Signed)
CRITICAL VALUE ALERT  Critical value received:  Glucose 575  Date of notification:  01/13/12  Time of notification:  0601  Critical value read back:yes  Nurse who received alert:  Laveda Norman, RN  MD notified (1st page):  Craige Cotta, NP  Time of first page:  0604  MD notified (2nd page):  Time of second page:  Responding MD:  Odessa Fleming  Time MD responded:  9604  Received Order to recheck BS in 1 hr, call MD if BS is still over 400.

## 2012-01-13 NOTE — Progress Notes (Signed)
CSW rec'd referral for ?SNF from [facility] on yesterday pm. Message left for the family to return CSW call to discuss ?SNF- awaiting call back- full assessment to follow- Reece Levy, MSW, Theresia Majors [phone]

## 2012-01-13 NOTE — Progress Notes (Signed)
Hypoglycemic Event  CBG:59  Treatment: D50 IV 25 mL  Symptoms: None  Follow-up CBG: Time:1155 CBG Result:101  Possible Reasons for Event: Unknown  Comments/MD notified: Mcclung notified, post 25ml of dextrose IV pt CBG at recheck was 101.  Nurse will continue to monitor    Lora Havens Dawn  Remember to initiate Hypoglycemia Order Set & complete

## 2012-01-13 NOTE — Evaluation (Signed)
Physical Therapy Evaluation Patient Details Name: Noah Torres MRN: [id number] DOB: 1940-05-14 Today's Date: 01/13/2012 Time: 4098-1191 PT Time Calculation (min): 17 min  PT Assessment / Plan / Recommendation Clinical Impression  pt present with PE, DVT, Cardiac Arrest, and NPH.  pt with minimal participation during PT.  pt demos no active movement of R side and tends to stay postured towards L side and difficulty getting pt to look to R side.  RN notes MD aware of R sided deficits and is unsure plan for NPH.  Will continue to follow.      PT Assessment  Patient needs continued PT services    Follow Up Recommendations  SNF    Does the patient have the potential to tolerate intense rehabilitation      Barriers to Discharge None      Equipment Recommendations  None recommended by PT    Recommendations for Other Services OT consult;Speech consult   Frequency Min 3X/week    Precautions / Restrictions Precautions Precautions: Fall Precaution Comments: NG Tube Restrictions Weight Bearing Restrictions: No   Pertinent Vitals/Pain Does not appear to be in pain.      Mobility  Bed Mobility Bed Mobility: Supine to Sit;Sitting - Scoot to Edge of Bed;Sit to Supine Supine to Sit: 1: +2 Total assist Supine to Sit: Patient Percentage: 10% Sitting - Scoot to Edge of Bed: 1: +2 Total assist Sitting - Scoot to Edge of Bed: Patient Percentage: 0% Sit to Supine: 1: +2 Total assist Sit to Supine: Patient Percentage: 0% Details for Bed Mobility Assistance: pt not participating in bed mobility despite attempts at facilitation and one step directions.   Transfers Transfers: Not assessed Ambulation/Gait Ambulation/Gait Assistance: Not tested (comment) Stairs: No Wheelchair Mobility Wheelchair Mobility: No    Shoulder Instructions     Exercises     PT Diagnosis: Difficulty walking;Hemiplegia dominant side  PT Problem List: Decreased strength;Decreased activity  tolerance;Decreased balance;Decreased mobility;Decreased cognition;Decreased coordination;Decreased knowledge of use of DME;Impaired sensation PT Treatment Interventions: DME instruction;Gait training;Functional mobility training;Therapeutic activities;Therapeutic exercise;Balance training;Neuromuscular re-education;Cognitive remediation;Patient/family education   PT Goals Acute Rehab PT Goals PT Goal Formulation: With family Time For Goal Achievement: 01/27/12 Potential to Achieve Goals: Fair Pt will go Supine/Side to Sit: with min assist PT Goal: Supine/Side to Sit - Progress: Goal set today Pt will Sit at Edge of Bed: with min assist PT Goal: Sit at Edge Of Bed - Progress: Goal set today Pt will go Sit to Supine/Side: with min assist PT Goal: Sit to Supine/Side - Progress: Goal set today Pt will go Sit to Stand: with mod assist PT Goal: Sit to Stand - Progress: Goal set today Pt will go Stand to Sit: with mod assist PT Goal: Stand to Sit - Progress: Goal set today Pt will Transfer Bed to Chair/Chair to Bed: with mod assist PT Transfer Goal: Bed to Chair/Chair to Bed - Progress: Goal set today  Visit Information  Last PT Received On: 01/13/12 Assistance Needed: +2    Subjective Data  Subjective: No verbalizations.     Prior Functioning  Home Living Lives With: Alone Available Help at Discharge: Family;Available PRN/intermittently Type of Home: House Home Layout: One level Home Adaptive Equipment: None Additional Comments: Home situation per Cousin in room.   Prior Function Level of Independence: Independent Able to Take Stairs?: Yes Driving: Yes Vocation: Retired Comments: PLOF per cousin in room.   Communication Communication:  (pt made no attempts at verbalizations.  )    Cognition  Overall  Cognitive Status: Difficult to assess Difficult to assess due to: Level of arousal Arousal/Alertness: Lethargic Behavior During Session: Lethargic    Extremity/Trunk  Assessment Right Lower Extremity Assessment RLE ROM/Strength/Tone: Deficits RLE ROM/Strength/Tone Deficits: No active movement noted.  Does withdraw from pain, but not as strong of a reaction as in R UE.   Left Lower Extremity Assessment LLE ROM/Strength/Tone: Deficits LLE ROM/Strength/Tone Deficits: pt not following directions to test strength or ROM, but active movement noted while in bed.     Balance    End of Session PT - End of Session Activity Tolerance: Patient limited by fatigue Patient left: in bed;with call bell/phone within reach;with family/visitor present Nurse Communication: Mobility status  GP     Sunny Schlein, [place] [phone] 01/13/2012, 1:16 PM

## 2012-01-13 NOTE — Progress Notes (Signed)
Pt venous chamber clotted with 32 mins left; notified Dr. Arrie Aran and he ordered to stop tx

## 2012-01-13 NOTE — Progress Notes (Signed)
Patient ID: Noah Torres, male   DOB: 1940/04/14, 71 y.o.   MRN: [id number]   [facility] Progress Note    Subjective:   Having good UOP at 2500 cc last 24 hours.  Last HD was Sunday. However BUN and creatinine did rise quite a bit from yesterday to today. Did open eyes to stimuli but no other commands today.    Objective:   BP 141/88  Pulse 107  Temp 99 F (37.2 C) (Axillary)  Resp 20  Ht 5\' 10"  (1.778 m)  Wt 82.3 kg (181 lb 7 oz)  BMI 26.03 kg/m2  SpO2 96%  Intake/Output Summary (Last 24 hours) at 01/13/12 0928 Last data filed at 01/13/12 0800  Gross per 24 hour  Intake   1588 ml  Output   2375 ml  Net   -787 ml   Weight change: -2.1 kg (-4 lb 10.1 oz)  Physical Exam: Gen: Somnolent and resting bed, opens eyes to voice/touch. No verbal responses. No commands,  CVS: Pulse regular tachycardia, heart sounds S1 and S2 normal Resp: Coarse breath sounds bilaterally-appear transmitted. No rales/rhonchi Abd: Soft, flat, nontender and bowel sounds are normal Ext: 1+ edema over lower extremities  Imaging: Mr Brain Wo Contrast  01/11/2012  *RADIOLOGY REPORT*  Clinical Data: Altered mental status  MRI HEAD WITHOUT CONTRAST  Technique:  Multiplanar, multiecho pulse sequences of the brain and surrounding structures were obtained according to standard protocol without intravenous contrast.  Comparison: CT head 01/08/2012  Findings: Moderate ventricular enlargement is unchanged.  The third and lateral ventricles have a  rounded appearance.  There is generalized atrophy with prominence of the sulci and cortical atrophy.  Ventricular enlargement is most likely due to cerebral atrophy.  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  Mild chronic ischemic changes involving the left caudate and external capsule.  Small chronic infarct left parietal lobe.  No acute infarct.  Negative for hemorrhage or mass lesion.  Chronic sinusitis.  IMPRESSION: Ventricular enlargement,  unchanged.  This is most likely due to global atrophy however communicating hydrocephalus could be present also.  Mild chronic ischemic change.  No acute infarct.   Original Report Authenticated By: Janeece Riggers, M.D.     Labs: BMET  Lab 01/13/12 0455 01/12/12 0420 01/10/12 0435 01/09/12 0600 01/08/12 0439 01/07/12 0500 01/06/12 1520  NA 143 143 142 141 135 137 138  K 3.8 3.7 4.2 4.0 4.6 4.4 4.0  CL 106 104 105 105 98 101 102  CO2 23 24 19 24 25 27 26   GLUCOSE 575* 280* 141* 84 86 190* 178*  BUN 62* 44* 52* 38* 25* 30* 31*  CREATININE 6.34* 5.34* 5.47* 4.24* 2.19* 2.24* 2.79*  ALB -- -- -- -- -- -- --  CALCIUM 8.9 8.8 9.1 8.8 8.8 8.3* 8.0*  PHOS 3.9 5.5* -- -- 2.8 3.3 3.8   CBC  Lab 01/13/12 0455 01/12/12 0425 01/11/12 0500 01/10/12 0435  WBC 8.1 6.3 6.5 7.7  NEUTROABS -- -- -- --  HGB 10.5* 10.5* 10.2* 10.2*  HCT 33.0* 32.2* 30.7* 31.9*  MCV 81.9 81.9 79.7 81.8  PLT 401* 314 276 224    Medications:       . albuterol  2.5 mg Nebulization QID  . antiseptic oral rinse  15 mL Mouth Rinse QID  . chlorhexidine  15 mL Mouth Rinse BID  . insulin aspart  0-15 Units Subcutaneous Q4H  . [COMPLETED] insulin aspart  20 Units Subcutaneous Once  . [COMPLETED] insulin aspart  20  Units Subcutaneous Once  . insulin glargine  20 Units Subcutaneous Daily  . metoprolol  5 mg Intravenous Q6H  . pantoprazole sodium  40 mg Per Tube Daily  . [COMPLETED] warfarin  6 mg Oral ONCE-1800  . warfarin  6 mg Oral ONCE-1800  . Warfarin - Pharmacist Dosing Inpatient   Does not apply q1800  . [DISCONTINUED] insulin glargine  20 Units Subcutaneous Daily  . [DISCONTINUED] pantoprazole (PROTONIX) IV  40 mg Intravenous Q24H  . [DISCONTINUED] warfarin  4 mg Oral ONCE-1800     Assessment/ Plan: 71 year old BM with acute on CRF in the setting of saddle PE and arrest.  He has been HD requiring  1. Acute renal failure: timeline of events appear to favor ischemic ATN with cardiorespiratory arrest/renal  hypoperfusion injury. Urine output 2500cc over the past 24 hours. Last intermittent HD treatment was Sunday, he tolerated well.  Has temp HD catheter in right IJ been in for 8 days.  Unfortunately with numbers going up by so much from yesterday to today, will support with a treatment today.  I am still  hopeful for improvement at least in renal status.   Baseline CKD, creatinine in the mid ones it appears.  2. Saddle pulmonary embolus: hemodynamically stable and on anti-coagulation with heparin per pharmacy.  3. Elevated LFTs: Likely shock liver- monitor with improving BP  4. S/p cardiorespiratory arrest: Neurology notes reviewed with regards to his altered mental status-plans noted for EEG/MRI. Neurologic status is concerning and likely will drive our course here. MRI shows nothing acute.  5.  Anemia- reasonable over 10, no aranesp will follow 6. Volume- euvolemic to slightly overloaded.  No diuretics for now, in nonoliguric phase 7 Elytes- no significant abnormalities, will follow.  4 k bath today   Annie Sable, MD 01/13/2012, 9:28 AM

## 2012-01-13 NOTE — Procedures (Signed)
Patient was seen on dialysis and the procedure was supervised.  BFR 350  Via VC BP is  128/74.   Patient appears to be tolerating treatment well  Noah Torres A 01/13/2012

## 2012-01-13 NOTE — Progress Notes (Signed)
[facility] [place] TEAM 1 - Stepdown/ICU TEAM  Noah Torres ZOX:[id number] DOB: 31-Dec-1940 DOA: 01/04/2012 PCP: No primary provider on file.  HPI: Noah Torres is an 71 y.o. male who was admitted at [facility] due to SOB and was found to have a PE and DVT. While hospitalized patient developed complications of ARF, acidosis and shock requiring intubation. After intubation went into PEA arrest, was given TPA and required resuscitation for about 7 minutes before ROSC was obtained. Patient was transferred here due to renal deterioration and need for dialysis. Has had improvement in renal status and has been extubated but has not returned to baseline mental status. In work up, CT of the head was performed that shows evidence of small vessel ischemic changes and large ventricles suggestive of NPH.   Tests / Events:  11/11 - Saddle PE noted on CT-A, started anticoagulation  11/12 - Echo shows PA pressure 58-60; Doppler shows LLE DVT  11/13 - Develops shock started on pressors, develops resp failure intubated, cardiac arrest - given TPA and resuscitated  11/14 - AKI (Creat increa 1.5-->2.7)  11/16 - GRN in sputum, concern for BLL PNA, start on rocephin  11/17 - stopped all pressors  11/18 - transferred to [facility] and started on CRRT  11/20 - Multiple episodes of bradycardia/hypotension 2/2 precedex use, so med stopped  11/21 - Successfully extubated, no improvement in mental status  11/22 - CT shows no stroke, possible normal pressure hydrocephalus   Assessment/Plan:  LLE DVT w/ Saddle Pulmonary Embolus w/ cardio-respiratory collapse Required TPA at outside hospital - cont heparin->>Coumadin per pharmacy  Acute renal failure / ATN on CKD (baseline crt 1.9) Ischemic ATN related to cardiac arrest - Nephrology is following - intermittently being dialyzed   Encephalopathy - ? NPH EEG and MRI completed - suspect his encephalopathy is coming from cardiac arrest - his is only  subtly improved to my exam today - will ask SLP to eval him as I fear a PEG will be required   transaminitis / shock liver LFTs have been slowly improving - recheck in AM  Tachycardia  Due to PE - on metoprolol  HTN Adjust tx regimen as BP not at goal presently  DM 2 Experienced some hypoglycemia today - adjust tx and follow trend  S/P Cardiac arrest (PEA) at outside hospital  Severe protein calorie malnutrition Continue tube feeds - SLP to eval - may require PEG  Reported GNR on sputum at OSH, s/p Rocephin x 3 days Restart antibiotics if persistant fever - low grade fever this morning.   Code Status: full code Family Communication: none Disposition Plan: SDU - ultimate plan is for SNF placement DVT prophylaxis: Heparin infusion  Consultants:  Nephro  Neuro   Antibiotics:  Ceftriaxone 11/16>>>11/18  HPI/Subjective: The patient will open his eyes and turn to the examiner today.  He will not answer questions or follow simple commands.  He does seem more alert than when I saw him 48 hours ago.  There is no family present at the time of my evaluation.  Objective: Filed Vitals:   01/13/12 1430 01/13/12 1500 01/13/12 1530 01/13/12 1600  BP: 146/79 144/82 144/78 151/82  Pulse: 107 102 114 113  Temp:      TempSrc:      Resp:      Height:      Weight:      SpO2:        Intake/Output Summary (Last 24 hours) at 01/13/12 1622 Last data filed  at 01/13/12 0900  Gross per 24 hour  Intake   1114 ml  Output   1575 ml  Net   -461 ml    Exam:  General: No acute respiratory distress at rest  Lungs: Bibasilar crackles with good air movement throughout other fields with no wheeze Cardiovascular: Tachycardic but regular without appreciable gallop rub or murmur Abdomen: Nontender, nondistended, soft, bowel sounds positive, no rebound, no ascites, no appreciable mass Extremities: No significant cyanosis, clubbing, or edema bilateral lower extremities  Data Reviewed: Basic  Metabolic Panel:  Lab 01/13/12 1610 01/12/12 0420 01/10/12 0435 01/09/12 0600 01/08/12 0439 01/07/12 0500  NA 143 143 142 141 135 --  K 3.8 3.7 4.2 4.0 4.6 --  CL 106 104 105 105 98 --  CO2 23 24 19 24 25  --  GLUCOSE 575* 280* 141* 84 86 --  BUN 62* 44* 52* 38* 25* --  CREATININE 6.34* 5.34* 5.47* 4.24* 2.19* --  CALCIUM 8.9 8.8 9.1 8.8 8.8 --  MG -- -- -- -- 2.6* 2.6*  PHOS 3.9 5.5* -- -- 2.8 3.3   Liver Function Tests:  Lab 01/13/12 0455 01/12/12 0425 01/12/12 0420 01/10/12 1152 01/09/12 0600 01/08/12 1220  AST -- 44* -- -- 67* 80*  ALT -- 85* -- 125* 165* 198*  ALKPHOS -- 120* -- -- 118* 123*  BILITOT -- 0.5 -- -- 0.8 0.7  PROT -- 7.0 -- -- 6.8 7.0  ALBUMIN 2.3* 2.2* 2.2* -- 2.4* 2.4*   CBC:  Lab 01/13/12 0455 01/12/12 0425 01/11/12 0500 01/10/12 0435 01/09/12 0453  WBC 8.1 6.3 6.5 7.7 6.4  NEUTROABS -- -- -- -- --  HGB 10.5* 10.5* 10.2* 10.2* 9.7*  HCT 33.0* 32.2* 30.7* 31.9* 30.1*  MCV 81.9 81.9 79.7 81.8 81.4  PLT 401* 314 276 224 157   CBG:  Lab 01/13/12 1259 01/13/12 1231 01/13/12 0745 01/11/12 1602 01/11/12 1212  GLUCAP 101* 59* 411* 106* 151*    Recent Results (from the past 240 hour(s))  MRSA PCR SCREENING     Status: Normal   Collection Time   01/04/12  3:06 PM      Component Value Range Status Comment   MRSA by PCR NEGATIVE  NEGATIVE Final   CULTURE, RESPIRATORY     Status: Normal   Collection Time   01/04/12  3:14 PM      Component Value Range Status Comment   Specimen Description TRACHEAL ASPIRATE   Final    Special Requests Normal   Final    Gram Stain     Final    Value: FEW WBC PRESENT,BOTH PMN AND MONONUCLEAR     RARE SQUAMOUS EPITHELIAL CELLS PRESENT     FEW GRAM POSITIVE RODS     FEW GRAM POSITIVE COCCI IN PAIRS     RARE YEAST   Culture MODERATE PSEUDOMONAS AERUGINOSA   Final    Report Status 01/07/2012 FINAL   Final    Organism ID, Bacteria PSEUDOMONAS AERUGINOSA   Final      Studies: Reviewed by Attending M.D.   Scheduled  Meds: Reviewed by Attending M.D. _______________________________________________________________________  Noah Blood, MD [facility] Office  [phone] Pager [phone]  On-Call/Text Page:      [email or web address]      password [facility]  01/13/2012, 4:22 PM  LOS: 9 days

## 2012-01-14 ENCOUNTER — Encounter (HOSPITAL_COMMUNITY): Payer: Self-pay | Admitting: Cardiology

## 2012-01-14 LAB — GLUCOSE, CAPILLARY
Glucose-Capillary: 280 mg/dL — ABNORMAL HIGH (ref 70–99)
Glucose-Capillary: 263 mg/dL — ABNORMAL HIGH (ref 70–99)
Glucose-Capillary: 299 mg/dL — ABNORMAL HIGH (ref 70–99)
Glucose-Capillary: 184 mg/dL — ABNORMAL HIGH (ref 70–99)
Glucose-Capillary: 219 mg/dL — ABNORMAL HIGH (ref 70–99)

## 2012-01-14 LAB — CBC
HCT: 32.1 % — ABNORMAL LOW (ref 39.0–52.0)
Hemoglobin: 10.3 g/dL — ABNORMAL LOW (ref 13.0–17.0)
MCH: 26.6 pg (ref 26.0–34.0)
MCHC: 32.1 g/dL (ref 30.0–36.0)
MCV: 82.9 fL (ref 78.0–100.0)
RDW: 18 % — ABNORMAL HIGH (ref 11.5–15.5)

## 2012-01-14 LAB — PROTIME-INR: Prothrombin Time: 22.2 seconds — ABNORMAL HIGH (ref 11.6–15.2)

## 2012-01-14 MED ORDER — LACTULOSE 10 GM/15ML PO SOLN
20.0000 g | Freq: Two times a day (BID) | ORAL | Status: DC
  Administered 2012-01-14 – 2012-01-17 (×5): 20 g
  Filled 2012-01-14 (×10): qty 30

## 2012-01-14 MED ORDER — WARFARIN SODIUM 6 MG PO TABS
6.0000 mg | ORAL_TABLET | Freq: Once | ORAL | Status: AC
  Administered 2012-01-14: 6 mg
  Filled 2012-01-14: qty 1

## 2012-01-14 MED ORDER — INSULIN GLARGINE 100 UNIT/ML ~~LOC~~ SOLN
26.0000 [IU] | Freq: Every day | SUBCUTANEOUS | Status: DC
  Administered 2012-01-15 – 2012-01-16 (×2): 26 [IU] via SUBCUTANEOUS

## 2012-01-14 NOTE — Progress Notes (Signed)
ANTICOAGULATION CONSULT NOTE - Follow Up Consult  Pharmacy Consult for Heparin Indication: pulmonary embolus  Allergies  Allergen Reactions  . Metformin And Related     Patient Measurements: Height: 5\' 10"  (177.8 cm) Weight: 181 lb 14.1 oz (82.5 kg) IBW/kg (Calculated) : 73   Vital Signs: Temp: 100.3 F (37.9 C) (11/28 0325) Temp src: Axillary (11/28 0325) BP: 127/65 mmHg (11/28 0700) Pulse Rate: 108  (11/28 0700)  Labs:  Basename 01/14/12 0442 01/13/12 0455 01/12/12 2008 01/12/12 0425 01/12/12 0420  HGB 10.3* 10.5* -- -- --  HCT 32.1* 33.0* -- 32.2* --  PLT 431* 401* -- 314 --  APTT -- -- -- -- --  LABPROT 22.2* 20.3* -- 19.3* --  INR 2.04* 1.81* -- 1.69* --  HEPARINUNFRC 0.47 0.50 0.55 -- --  CREATININE -- 6.34* -- -- 5.34*  CKTOTAL -- -- -- -- --  CKMB -- -- -- -- --  TROPONINI -- -- -- -- --    Estimated Creatinine Clearance: 11.2 ml/min (by C-G formula based on Cr of 6.34).    Assessment: Saddle PE:  Continuing Heparin bridging to Coumadin.  Heparin level is therapeutic at 0.47.  INR therapeutic at 2.04.  Goal of Therapy:  INR 2-3 Heparin level 0.3-0.7 units/ml Monitor platelets by anticoagulation protocol: Yes   Plan:  Continue Heparin at 1400 units/hr Coumadin 6mg  x 1 dose today Follow up AM heparin level, CBC, protime Heparin drip and warfarin have overlapped for 6 days.  Heparin drip can be stopped anytime at MD's discretion.  Celedonio Miyamoto, PharmD, BCPS Clinical Pharmacist Pager [phone]  01/14/2012, 7:18 AM

## 2012-01-14 NOTE — Progress Notes (Addendum)
Dr. Sharon Seller notified of no BM since 11/24, order for lactulose.  Order to transfer pt to telemetry.  Pt notified.  Report called to [facility] 2000.  Pt to transfer via bed with all belongings to 2032.  Daughter [name] notified.    Discussed pt need for foley with Dr. Sharon Seller.  He would like very accurate I&O at this time and will keep foley for now.

## 2012-01-14 NOTE — Progress Notes (Signed)
Patient ID: Noah Torres, male   DOB: [birth year], 71 y.o.   MRN: [id number]   [facility] Progress Note    Subjective:   Did HD yesterday, took off 1.5 liters, he tolerated well.  He also made 1200 of urine on his own.     Objective:   BP 141/73  Pulse 106  Temp 99.6 F (37.6 C) (Oral)  Resp 17  Ht 5\' 10"  (1.778 m)  Wt 82.5 kg (181 lb 14.1 oz)  BMI 26.10 kg/m2  SpO2 94%  Intake/Output Summary (Last 24 hours) at 01/14/12 0858 Last data filed at 01/14/12 0326  Gross per 24 hour  Intake    740 ml  Output   2611 ml  Net  -1871 ml   Weight change: 1 kg (2 lb 3.3 oz)  Physical Exam: Gen: Somnolent and resting bed, opens eyes to voice/touch. No verbal responses. No commands,  CVS: Pulse regular tachycardia, heart sounds S1 and S2 normal Resp: Coarse breath sounds bilaterally-appear transmitted. No rales/rhonchi Abd: Soft, flat, nontender and bowel sounds are normal Ext: 1+ edema over lower extremities  Imaging: Dg Chest Port 1 View  01/13/2012  *RADIOLOGY REPORT*  Clinical Data: 71 year old male with shortness of breath and congestion.  PORTABLE CHEST - 1 VIEW  Comparison: 01/07/2012 and prior chest radiographs dating back to 01/04/2012.  Findings: Cardiomediastinal silhouette is stable. A right IJ central venous catheter is again identified with tip overlying the mid SVC. An NG tube is present entering the stomach with tip off the field of view. An endotracheal tube and left central venous catheter have been removed. Mild pulmonary vascular congestion is again noted. Improved bibasilar aeration identified. There is no evidence of pneumothorax, large pleural effusion or definite focal airspace disease.  IMPRESSION: Improved aeration and decreased bibasilar opacities/atelectasis.  Endotracheal tube and left central venous catheter removal.   Original Report Authenticated By: Harmon Pier, M.D.     Labs: BMET  Lab 01/13/12 0455 01/12/12 0420 01/10/12 0435 01/09/12  0600 01/08/12 0439  NA 143 143 142 141 135  K 3.8 3.7 4.2 4.0 4.6  CL 106 104 105 105 98  CO2 23 24 19 24 25   GLUCOSE 575* 280* 141* 84 86  BUN 62* 44* 52* 38* 25*  CREATININE 6.34* 5.34* 5.47* 4.24* 2.19*  ALB -- -- -- -- --  CALCIUM 8.9 8.8 9.1 8.8 8.8  PHOS 3.9 5.5* -- -- 2.8   CBC  Lab 01/14/12 0442 01/13/12 0455 01/12/12 0425 01/11/12 0500  WBC 7.7 8.1 6.3 6.5  NEUTROABS -- -- -- --  HGB 10.3* 10.5* 10.5* 10.2*  HCT 32.1* 33.0* 32.2* 30.7*  MCV 82.9 81.9 81.9 79.7  PLT 431* 401* 314 276    Medications:       . antiseptic oral rinse  15 mL Mouth Rinse QID  . carvedilol  6.25 mg Per Tube BID WC  . chlorhexidine  15 mL Mouth Rinse BID  . insulin aspart  0-15 Units Subcutaneous Q4H  . insulin glargine  20 Units Subcutaneous Daily  . pantoprazole sodium  40 mg Per Tube Daily  . [COMPLETED] warfarin  6 mg Per Tube ONCE-1800  . warfarin  6 mg Per Tube ONCE-1800  . Warfarin - Pharmacist Dosing Inpatient   Does not apply q1800  . [DISCONTINUED] albuterol  2.5 mg Nebulization QID  . [DISCONTINUED] metoprolol  5 mg Intravenous Q6H  . [DISCONTINUED] warfarin  6 mg Oral ONCE-1800     Assessment/ Plan: 71 year  old BM with acute on CRF in the setting of saddle PE and arrest.  He has been HD requiring  1. Acute renal failure: timeline of events appear to favor ischemic ATN with cardiorespiratory arrest/renal hypoperfusion injury. Urine output nonoliguric over the past 24 hours. Last intermittent HD treatment was Wednesday, he tolerated well.  Has temp HD catheter in right IJ been in for 9 days.    I am still  hopeful for eventual improvement at least in renal status.   I am currently dialyzing him on a PRN basis.  This week was done Sunday and Tuesday.  Will check labs in AM.  Baseline CKD, creatinine in the mid ones it appears. He will need PC next week if continues to require HD  2. Saddle pulmonary embolus: hemodynamically stable and on anti-coagulation with heparin per pharmacy.   3. Elevated LFTs: Likely shock liver- monitor with improving BP  4. S/p cardiorespiratory arrest: Neurology notes reviewed with regards to his altered mental status-plans noted for EEG/MRI. Neurologic status is concerning and likely will drive our course here. MRI shows nothing acute.  5.  Anemia- reasonable over 10, no aranesp will follow 6. Volume- euvolemic to slightly overloaded.  No diuretics for now, in nonoliguric phase 7 Elytes- no significant abnormalities, will follow.  4 k bath today   Annie Sable, MD 01/14/2012, 8:58 AM

## 2012-01-14 NOTE — Progress Notes (Addendum)
[facility] [place] TEAM 1 - Stepdown/ICU TEAM  [name] XWR:[id number] DOB: 11/24/1940 DOA: 01/04/2012 PCP: No primary provider on file.  HPI: Noah Torres is an 71 y.o. male who was admitted at [facility] due to SOB and was found to have a PE and DVT. While hospitalized patient developed complications of ARF, acidosis and shock requiring intubation. After intubation went into PEA arrest, was given TPA and required resuscitation for about 7 minutes before ROSC was obtained. Patient was transferred here due to renal deterioration and need for dialysis. Has had improvement in renal status and has been extubated but has not returned to baseline mental status. In work up, CT of the head was performed that shows evidence of small vessel ischemic changes and large ventricles suggestive of NPH.   Tests / Events:  11/11 - Saddle PE noted on CT-A, started anticoagulation  11/12 - Echo shows PA pressure 58-60; Doppler shows LLE DVT  11/13 - Develops shock started on pressors, develops resp failure intubated, cardiac arrest - given TPA and resuscitated  11/14 - AKI (Creat increa 1.5-->2.7)  11/16 - GRN in sputum, concern for BLL PNA, start on rocephin  11/17 - stopped all pressors  11/18 - transferred to [facility] and started on CRRT  11/20 - Multiple episodes of bradycardia/hypotension 2/2 precedex use, so med stopped  11/21 - Successfully extubated, no improvement in mental status  11/22 - CT shows no stroke, possible normal pressure hydrocephalus   Assessment/Plan:  LLE DVT w/ Saddle Pulmonary Embolus w/ cardio-respiratory collapse Required TPA at outside hospital - cont heparin->>Coumadin per pharmacy - INR at goal for first time today   Acute renal failure / ATN on CKD (baseline crt 1.9) Ischemic ATN related to cardiac arrest - Nephrology is following - intermittently being dialyzed   Encephalopathy - ? NPH EEG and MRI completed and are w/o clear contributory findings -  suspect his encephalopathy is coming from cardiac arrest - his is only subtly improved to my exam today - have asked SLP to eval him as I fear a PEG will be required   transaminitis / shock liver LFTs have been slowly improving - recheck in AM Friday  Tachycardia  Due to PE - on metoprolol  HTN BP control improved - follow trend  DM 2 Experienced some hypoglycemia 11/27 - now CBG elevated - adjust tx further  S/P Cardiac arrest (PEA) at outside hospital  Severe protein calorie malnutrition Continue tube feeds - SLP to eval - may require PEG  Reported GNR on sputum at OSH, s/p Rocephin x 3 days Restart antibiotics if persistant fever  Code Status: full code Family Communication: spoke w/ daughter Jaynie Crumble via phone [phone] Disposition Plan: stable for transfer to tele bed - ultimate plan is for SNF placement DVT prophylaxis: Heparin infusion  Consultants:  Nephro  Neuro   Antibiotics:  Ceftriaxone 11/16>>>11/18  HPI/Subjective: The patient will open his eyes and turn to the examiner.  He will still not answer questions or follow simple commands.  There is no family present at the time of my evaluation.  Objective: Filed Vitals:   01/14/12 0800 01/14/12 0900 01/14/12 1000 01/14/12 1156  BP: 141/73 137/80 133/63 132/75  Pulse: 106 113 94 106  Temp: 99.6 F (37.6 C)   100.1 F (37.8 C)  TempSrc: Oral   Oral  Resp: 17 26 17 22   Height:      Weight:      SpO2: 94% 94% 94% 96%  Intake/Output Summary (Last 24 hours) at 01/14/12 1329 Last data filed at 01/14/12 1300  Gross per 24 hour  Intake   1254 ml  Output   3111 ml  Net  -1857 ml    Exam:  General: No acute respiratory distress at rest  Lungs: Bibasilar crackles with good air movement throughout other fields with no wheeze Cardiovascular: Tachycardic but regular without appreciable gallop rub or murmur Abdomen: Nontender, nondistended, soft, bowel sounds positive, no rebound, no ascites, no  appreciable mass Extremities: No significant cyanosis, clubbing, or edema bilateral lower extremities  Data Reviewed: Basic Metabolic Panel:  Lab 01/13/12 8756 01/12/12 0420 01/10/12 0435 01/09/12 0600 01/08/12 0439  NA 143 143 142 141 135  K 3.8 3.7 4.2 4.0 4.6  CL 106 104 105 105 98  CO2 23 24 19 24 25   GLUCOSE 575* 280* 141* 84 86  BUN 62* 44* 52* 38* 25*  CREATININE 6.34* 5.34* 5.47* 4.24* 2.19*  CALCIUM 8.9 8.8 9.1 8.8 8.8  MG -- -- -- -- 2.6*  PHOS 3.9 5.5* -- -- 2.8   Liver Function Tests:  Lab 01/13/12 0455 01/12/12 0425 01/12/12 0420 01/10/12 1152 01/09/12 0600 01/08/12 1220  AST -- 44* -- -- 67* 80*  ALT -- 85* -- 125* 165* 198*  ALKPHOS -- 120* -- -- 118* 123*  BILITOT -- 0.5 -- -- 0.8 0.7  PROT -- 7.0 -- -- 6.8 7.0  ALBUMIN 2.3* 2.2* 2.2* -- 2.4* 2.4*   CBC:  Lab 01/14/12 0442 01/13/12 0455 01/12/12 0425 01/11/12 0500 01/10/12 0435  WBC 7.7 8.1 6.3 6.5 7.7  NEUTROABS -- -- -- -- --  HGB 10.3* 10.5* 10.5* 10.2* 10.2*  HCT 32.1* 33.0* 32.2* 30.7* 31.9*  MCV 82.9 81.9 81.9 79.7 81.8  PLT 431* 401* 314 276 224   CBG:  Lab 01/14/12 1145 01/14/12 0824 01/14/12 0324 01/13/12 1958 01/13/12 1259  GLUCAP 299* 263* 280* 240* 101*    Recent Results (from the past 240 hour(s))  MRSA PCR SCREENING     Status: Normal   Collection Time   01/04/12  3:06 PM      Component Value Range Status Comment   MRSA by PCR NEGATIVE  NEGATIVE Final   CULTURE, RESPIRATORY     Status: Normal   Collection Time   01/04/12  3:14 PM      Component Value Range Status Comment   Specimen Description TRACHEAL ASPIRATE   Final    Special Requests Normal   Final    Gram Stain     Final    Value: FEW WBC PRESENT,BOTH PMN AND MONONUCLEAR     RARE SQUAMOUS EPITHELIAL CELLS PRESENT     FEW GRAM POSITIVE RODS     FEW GRAM POSITIVE COCCI IN PAIRS     RARE YEAST   Culture MODERATE PSEUDOMONAS AERUGINOSA   Final    Report Status 01/07/2012 FINAL   Final    Organism ID, Bacteria PSEUDOMONAS  AERUGINOSA   Final      Studies: Reviewed by Attending M.D.   Scheduled Meds: Reviewed by Attending M.D. _______________________________________________________________________  Lonia Blood, MD [facility] Office  [phone] Pager [phone]  On-Call/Text Page:      [email or web address]      password [facility]  01/14/2012, 1:29 PM  LOS: 10 days

## 2012-01-15 LAB — PROTIME-INR
INR: 1.95 — ABNORMAL HIGH (ref 0.00–1.49)
Prothrombin Time: 21.5 seconds — ABNORMAL HIGH (ref 11.6–15.2)

## 2012-01-15 LAB — GLUCOSE, CAPILLARY
Glucose-Capillary: 195 mg/dL — ABNORMAL HIGH (ref 70–99)
Glucose-Capillary: 216 mg/dL — ABNORMAL HIGH (ref 70–99)
Glucose-Capillary: 251 mg/dL — ABNORMAL HIGH (ref 70–99)

## 2012-01-15 LAB — CBC
HCT: 32.4 % — ABNORMAL LOW (ref 39.0–52.0)
MCHC: 31.2 g/dL (ref 30.0–36.0)
Platelets: 491 10*3/uL — ABNORMAL HIGH (ref 150–400)
RDW: 18.1 % — ABNORMAL HIGH (ref 11.5–15.5)
WBC: 9 10*3/uL (ref 4.0–10.5)

## 2012-01-15 LAB — RENAL FUNCTION PANEL
Albumin: 2.4 g/dL — ABNORMAL LOW (ref 3.5–5.2)
BUN: 49 mg/dL — ABNORMAL HIGH (ref 6–23)
CO2: 25 mEq/L (ref 19–32)
Calcium: 9.2 mg/dL (ref 8.4–10.5)
Chloride: 108 mEq/L (ref 96–112)
Creatinine, Ser: 5.29 mg/dL — ABNORMAL HIGH (ref 0.50–1.35)
GFR calc Af Amer: 11 mL/min — ABNORMAL LOW (ref >90)

## 2012-01-15 LAB — HEPATIC FUNCTION PANEL
ALT: 53 U/L (ref 0–53)
Indirect Bilirubin: 0.2 mg/dL — ABNORMAL LOW (ref 0.3–0.9)
Total Protein: 7.7 g/dL (ref 6.0–8.3)

## 2012-01-15 MED ORDER — WARFARIN SODIUM 6 MG PO TABS
6.0000 mg | ORAL_TABLET | Freq: Every day | ORAL | Status: DC
  Filled 2012-01-15: qty 1

## 2012-01-15 MED ORDER — WARFARIN - PHARMACIST DOSING INPATIENT: Freq: Every day | Status: DC

## 2012-01-15 NOTE — Progress Notes (Signed)
ANTICOAGULATION CONSULT NOTE - Follow Up Consult  Pharmacy Consult for Heparin Indication: pulmonary embolus  Allergies  Allergen Reactions  . Metformin And Related    Patient Measurements: Height: 5\' 10"  (177.8 cm) Weight: 181 lb 14.1 oz (82.5 kg) IBW/kg (Calculated) : 73   Vital Signs: Temp: 98 F (36.7 C) (11/29 0448) Temp src: Axillary (11/29 0448) BP: 160/74 mmHg (11/29 0448) Pulse Rate: 93  (11/29 0448)  Labs:  Basename 01/15/12 0440 01/14/12 0442 01/13/12 0455  HGB 10.1* 10.3* --  HCT 32.4* 32.1* 33.0*  PLT 491* 431* 401*  APTT -- -- --  LABPROT 21.5* 22.2* 20.3*  INR 1.95* 2.04* 1.81*  HEPARINUNFRC 0.41 0.47 0.50  CREATININE 5.29* -- 6.34*  CKTOTAL -- -- --  CKMB -- -- --  TROPONINI -- -- --   Estimated Creatinine Clearance: 13.4 ml/min (by C-G formula based on Cr of 5.29).  Assessment: Saddle PE:  Continuing Heparin bridging to Coumadin.  Heparin level is therapeutic at 0.41.  INR just below therapeutic range at 1.95 but essentially at goal.  Dose yesterday was not charted until 01:32AM and daily labs drawn shortly afterward and is likely the reason for decreased response.  Goal of Therapy:  INR 2-3 Heparin level 0.3-0.7 units/ml Monitor platelets by anticoagulation protocol: Yes   Plan:  Continue Heparin at 1400 units/hr - would discontinue now that he has had 2 day overlap with therapeutic INR. Coumadin 6mg  x 1 dose today Follow up AM heparin level, CBC, protime Heparin drip and warfarin have overlapped for 7 days.    Nadara Mustard, PharmD., MS Clinical Pharmacist Pager:  [phone] Thank you for allowing pharmacy to be part of this patients care team. 01/15/2012, 9:07 AM

## 2012-01-15 NOTE — Evaluation (Signed)
Clinical/Bedside Swallow Evaluation Patient Details  Name: Noah Torres MRN: [id number] Date of Birth: [birth year]  Today's Date: 01/15/2012 Time: 0830-0900 SLP Time Calculation (min): 30 min  Past Medical History:  Past Medical History  Diagnosis Date  . Diabetes mellitus, type 2   . Hypercholesterolemia   . Former smoker   . Bilateral hearing loss     Wears hearing aids  . Chronic kidney disease   . Hearing loss     uses hearing aids   Past Surgical History:  Past Surgical History  Procedure Date  . Spine surgery    HPI:  Noah Torres is an 71 y.o. male who was admitted at [facility] due to SOB and was found to have a PE and DVT. While hospitalized patient developed complications of ARF, acidosis and shock requiring intubation. After intubation went into PEA arrest, was given TPA and required resuscitation for about 7 minutes before ROSC was obtained. Patient was transferred here due to renal deterioration and need for dialysis. Has had improvement in renal status and has been extubated but has not returned to baseline mental status. In work up, CT of the head was performed that shows evidence of small vessel ischemic changes and large ventricles suggestive of NPH.    Assessment / Plan / Recommendation Clinical Impression  Pts altered mental status impacting participation. Pt refusing PO trials. With SLP administered bites of ice/sips of water pt with no oral manipulation though clearly conscious of efforts. Pt spits/lets Po fall from mouth. Continue alternate nutrition, SLP will continue efforts.     Aspiration Risk  Mild    Diet Recommendation NPO;Alternative means - temporary   Medication Administration: Via alternative means    Other  Recommendations Oral Care Recommendations: Oral care QID   Follow Up Recommendations  Skilled Nursing facility    Frequency and Duration min 2x/week  2 weeks   Pertinent Vitals/Pain NA    SLP Swallow Goals Goal #3:  Pt will actively accept and transit a variety of PO trials with SLP to determine ability to initiate diet with moderate contextual cues.    Swallow Study Prior Functional Status       General HPI: Noah Torres is an 71 y.o. male who was admitted at [facility] due to SOB and was found to have a PE and DVT. While hospitalized patient developed complications of ARF, acidosis and shock requiring intubation. After intubation went into PEA arrest, was given TPA and required resuscitation for about 7 minutes before ROSC was obtained. Patient was transferred here due to renal deterioration and need for dialysis. Has had improvement in renal status and has been extubated but has not returned to baseline mental status. In work up, CT of the head was performed that shows evidence of small vessel ischemic changes and large ventricles suggestive of NPH.  Type of Study: Bedside swallow evaluation Diet Prior to this Study: NPO;Panda Temperature Spikes Noted: No Respiratory Status: Supplemental O2 delivered via (comment) Behavior/Cognition: Alert;Uncooperative;Doesn't follow directions Oral Cavity - Dentition: Edentulous Self-Feeding Abilities: Total assist Patient Positioning: Upright in bed Baseline Vocal Quality: Other (comment) (No phonation) Volitional Cough: Cognitively unable to elicit Volitional Swallow: Unable to elicit    Oral/Motor/Sensory Function Overall Oral Motor/Sensory Function: Other (comment) (Pt would not follow oral motor commands )   Ice Chips Ice chips: Impaired Presentation: Spoon Oral Phase Impairments: Reduced labial seal;Reduced lingual movement/coordination;Impaired anterior to posterior transit;Poor awareness of bolus Oral Phase Functional Implications: Left anterior spillage   Thin Liquid Thin  Liquid: Impaired Presentation: Cup Oral Phase Impairments: Reduced labial seal;Poor awareness of bolus    Nectar Thick Nectar Thick Liquid: Not tested   Honey Thick Honey  Thick Liquid: Not tested   Puree Puree:  (Pt refused)   Solid   GO    Solid: Not tested      Harlon Ditty, MA CCC-SLP [phone]  Mariena Meares, Riley Nearing 01/15/2012,11:13 AM

## 2012-01-15 NOTE — Progress Notes (Signed)
01/15/2012 1:49 PM Nursing note  During patient rounds, NT found pt NGT pulled out approximately 4 inches. RN called to room. NGT advanced back to prior location. Placement confirmed via auscultation. NGT re secured to nares with benzoin. Dr. Cena Benton on floor and made aware. No orders received at this time. Will continue to closely monitor patient. During this time, pt. Son Judithann Sheen called to check on Pt. Status. Asked that MD call him for updates. Md made aware of sons wishes.  Rayan Dyal, Blanchard Kelch

## 2012-01-15 NOTE — Progress Notes (Signed)
[facility] [place] TEAM 1 - Stepdown/ICU TEAM  Noah Torres ZOX:[id number] DOB: [birth year] DOA: 01/04/2012 PCP: No primary provider on file.  HPI: Noah Torres is an 71 y.o. male who was admitted at [facility] due to SOB and was found to have a PE and DVT. While hospitalized patient developed complications of ARF, acidosis and shock requiring intubation. After intubation went into PEA arrest, was given TPA and required resuscitation for about 7 minutes before ROSC was obtained. Patient was transferred here due to renal deterioration and need for dialysis. Has had improvement in renal status and has been extubated but has not returned to baseline mental status. In work up, CT of the head was performed that shows evidence of small vessel ischemic changes and large ventricles suggestive of NPH.   Tests / Events:  11/11 - Saddle PE noted on CT-A, started anticoagulation  11/12 - Echo shows PA pressure 58-60; Doppler shows LLE DVT  11/13 - Develops shock started on pressors, develops resp failure intubated, cardiac arrest - given TPA and resuscitated  11/14 - AKI (Creat increa 1.5-->2.7)  11/16 - GRN in sputum, concern for BLL PNA, start on rocephin  11/17 - stopped all pressors  11/18 - transferred to [facility] and started on CRRT  11/20 - Multiple episodes of bradycardia/hypotension 2/2 precedex use, so med stopped  11/21 - Successfully extubated, no improvement in mental status  11/22 - CT shows no stroke, possible normal pressure hydrocephalus   Assessment/Plan:  LLE DVT w/ Saddle Pulmonary Embolus w/ cardio-respiratory collapse Required TPA at outside hospital - cont heparin->>Coumadin per pharmacy - At this point given that patient will require tube feeds given his neurological condition and inability to feed himself we will need to hold coumadin and as such will plan on continuing heparin with plans on holding heparin 4 hours prior to procedure.  Acute renal failure /  ATN on CKD (baseline crt 1.9) Ischemic ATN related to cardiac arrest - Nephrology is following - intermittently being dialyzed   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  Discussed with son who agrees to proceed with feeding tube placement.  transaminitis / shock liver LFTs have been slowly improving and currently back at baseline.  Tachycardia  Due to PE - on metoprolol  HTN BP relatively well controlled   DM 2 Experienced some hypoglycemia 11/27 - now CBG elevated - adjust tx further  S/P Cardiac arrest (PEA) at outside hospital  Severe protein calorie malnutrition Continue tube feeds - will make plans and have discussed case with Interventional radiology and son for peg tub placement.  Reported GNR on sputum at OSH, s/p Rocephin x 3 days Restart antibiotics if persistant fever  Code Status: full code Family Communication: spoke w/ son Noah Torres at [phone] Disposition Plan: stable for transfer to tele bed - ultimate plan is for SNF placement DVT prophylaxis: Heparin infusion  Consultants:  Nephro  Neuro   Antibiotics:  Ceftriaxone 11/16>>>11/18  HPI/Subjective: The patient will open his eyes and turn to the examiner.  He will still not answer questions or follow simple commands.  There is no family present at the time of my evaluation.  Objective: Filed Vitals:   01/14/12 1933 01/15/12 0448 01/15/12 1036 01/15/12 1359  BP: 151/74 160/74 144/85 159/84  Pulse: 85 93 105 98  Temp:  98.8 F (37.1 C) 98 F (36.7 C)  99.5 F (37.5 C)  TempSrc: Axillary Axillary  Oral  Resp: 20 16  16   Height:      Weight:      SpO2: 99% 100%  100%    Intake/Output Summary (Last 24 hours) at 01/15/12 1448 Last data filed at 01/15/12 1401  Gross per 24 hour  Intake   1214 ml    Output   1254 ml  Net    -40 ml    Exam:  General: No acute respiratory distress at rest  Lungs: Bibasilar crackles, breath sounds BL, no wheezes Cardiovascular: RRR no MRG Abdomen: Nontender, nondistended, soft, bowel sounds positive, no rebound, no ascites, no appreciable mass Extremities: No significant cyanosis, clubbing, or edema bilateral lower extremities Neuro: Patient gazes in examiners general direction but does not maintain gaze and seems to have non purposeful movements.  Data Reviewed: Basic Metabolic Panel:  Lab 01/15/12 1610 01/13/12 0455 01/12/12 0420 01/10/12 0435 01/09/12 0600  NA 145 143 143 142 141  K 3.5 3.8 3.7 4.2 4.0  CL 108 106 104 105 105  CO2 25 23 24 19 24   GLUCOSE 275* 575* 280* 141* 84  BUN 49* 62* 44* 52* 38*  CREATININE 5.29* 6.34* 5.34* 5.47* 4.24*  CALCIUM 9.2 8.9 8.8 9.1 8.8  MG -- -- -- -- --  PHOS 2.7 3.9 5.5* -- --   Liver Function Tests:  Lab 01/15/12 0440 01/13/12 0455 01/12/12 0425 01/12/12 0420 01/10/12 1152 01/09/12 0600  AST 36 -- 44* -- -- 67*  ALT 53 -- 85* -- 125* 165*  ALKPHOS 120* -- 120* -- -- 118*  BILITOT 0.4 -- 0.5 -- -- 0.8  PROT 7.7 -- 7.0 -- -- 6.8  ALBUMIN 2.4*2.5* 2.3* 2.2* 2.2* -- 2.4*   CBC:  Lab 01/15/12 0440 01/14/12 0442 01/13/12 0455 01/12/12 0425 01/11/12 0500  WBC 9.0 7.7 8.1 6.3 6.5  NEUTROABS -- -- -- -- --  HGB 10.1* 10.3* 10.5* 10.5* 10.2*  HCT 32.4* 32.1* 33.0* 32.2* 30.7*  MCV 83.1 82.9 81.9 81.9 79.7  PLT 491* 431* 401* 314 276   CBG:  Lab 01/15/12 1129 01/15/12 0719 01/15/12 0413 01/14/12 2339 01/14/12 2000  GLUCAP 251* 244* 252* 216* 219*    No results found for this or any previous visit (from the past 240 hour(s)).   _______________________________________________________________________  Noah Torres  [facility] Office  [phone] Pager [phone]  On-Call/Text Page:      [email or web address]      password [facility]  01/15/2012, 2:48 PM  LOS: 11 days

## 2012-01-15 NOTE — Progress Notes (Signed)
Noah Torres,PT Acute Rehabilitation [phone] (pager)  

## 2012-01-15 NOTE — Progress Notes (Signed)
Clinical Social Worker received referral for SNF.  CSW received report that pt is unable to fully participate in decision making due to medical condition.  CSW left messages with each number listed in chart.  CSW to continue to follow and assist as needed.   Angelia Mould, MSW, [place] [phone]

## 2012-01-15 NOTE — Progress Notes (Signed)
Physical Therapy Treatment Patient Details Name: Noah Torres MRN: [id number] DOB: January 01, 1941 Today's Date: 01/15/2012 Time: 4098-1191 PT Time Calculation (min): 19 min  PT Assessment / Plan / Recommendation Comments on Treatment Session   Pt with dyspnea, LE DVT, saddle PE with RV strain, cardiac arrest, and ARF.  Mr Levene had few responses to PT/SPT interaction.  He nodded yes and no once, and followed movement in his room with head turns and smooth pursuit eye movement.  Pt had mild spasticity with PROM in UE. Will continue to follow, try sitting EOB, and determine if pt will start to participate in therapy.      Follow Up Recommendations  SNF           Equipment Recommendations  None recommended by PT       Frequency Min 3X/week   Plan Discharge plan remains appropriate;Frequency remains appropriate    Precautions / Restrictions Precautions Precautions: Fall Precaution Comments: NG Tube Restrictions Weight Bearing Restrictions: No   Pertinent Vitals/Pain VSS, denies pain (with head shake 'no')    Mobility  Bed Mobility Bed Mobility: Not assessed Transfers Transfers: Not assessed Ambulation/Gait Ambulation/Gait Assistance: Not tested (comment) Stairs: No Wheelchair Mobility Wheelchair Mobility: No    Exercises Other Exercises Other Exercises: PROM to all extremities (pt participated with trace movment only with left ankle DF, left elbow flexion).  Mild spasticity noted with left elbow flexion/extion and right elbow flexion.  Demonstrates involuntary movement of left hemibody at rest without command. No active movement upon command.      PT Goals Acute Rehab PT Goals PT Goal: Supine/Side to Sit - Progress: Not progressing PT Goal: Sit at [facility] Of Bed - Progress: Not progressing PT Goal: Sit to Supine/Side - Progress: Not progressing PT Goal: Sit to Stand - Progress: Not progressing PT Goal: Stand to Sit - Progress: Not progressing PT Transfer Goal: Bed  to Chair/Chair to Bed - Progress: Not progressing Additional Goals Additional Goal #1: Tolerate UE/LE AAROM.  PT Goal: Additional Goal #1 - Progress: Goal set today  Visit Information  Last PT Received On: 01/15/12 Assistance Needed: +2    Subjective Data  Subjective: No verbalizations.     Cognition  Overall Cognitive Status: Difficult to assess Difficult to assess due to: Impaired communication;Level of arousal Arousal/Alertness: Lethargic Orientation Level:  (unable to assess) Behavior During Session: Lethargic Cognition - Other Comments: Pt turns head with PT/SPT movment around his bed, responds no with head shake when asked if he is in pain, responds yes with head nod when asked  if he was comfortable after repositioning pt's head. Otherwise pt had no verbalizations and minimal tracking with eyes to PT commands and with UE/LE PROM.        End of Session PT - End of Session Activity Tolerance:  (unable to assess, no change in pt facial expressions or resp) Patient left: in bed;with call bell/phone within reach Nurse Communication: Mobility status;Need for lift equipment       Sharion Balloon 01/15/2012, 4:08 PM  Sharion Balloon, SPT Acute Rehab Services [phone]

## 2012-01-15 NOTE — Progress Notes (Signed)
Received request for gastric tube placement - coumadin has been started for this patient. The soonest gastric tube could be placed would be Monday,  INR would need to be </= 1.5 prior to g-tube placement. Will let pharmacy handle heparin/coumadin adjusting.  Will follow over weekend.

## 2012-01-15 NOTE — Progress Notes (Signed)
Patient ID: Noah Torres, male   DOB: 04-09-1940, 71 y.o.   MRN: [id number]   [facility] Progress Note    Subjective:   Reasonable UOP overnight.   Moved to 2000.  Neurologically still very poor.    Objective:   BP 160/74  Pulse 93  Temp 98 F (36.7 C) (Axillary)  Resp 16  Ht 5\' 10"  (1.778 m)  Wt 82.5 kg (181 lb 14.1 oz)  BMI 26.10 kg/m2  SpO2 100%  Intake/Output Summary (Last 24 hours) at 01/15/12 0846 Last data filed at 01/15/12 0729  Gross per 24 hour  Intake   1718 ml  Output   1754 ml  Net    -36 ml   Weight change:   Physical Exam: Gen: Somnolent and resting bed, opens eyes to voice/touch. No verbal responses. No commands,  CVS: Pulse regular tachycardia, heart sounds S1 and S2 normal Resp: Coarse breath sounds bilaterally-appear transmitted. No rales/rhonchi Abd: Soft, flat, nontender and bowel sounds are normal Ext: 1+ edema over lower extremities.  Right sided vascath   Imaging: Dg Chest Port 1 View  01/13/2012  *RADIOLOGY REPORT*  Clinical Data: 71 year old male with shortness of breath and congestion.  PORTABLE CHEST - 1 VIEW  Comparison: 01/07/2012 and prior chest radiographs dating back to 01/04/2012.  Findings: Cardiomediastinal silhouette is stable. A right IJ central venous catheter is again identified with tip overlying the mid SVC. An NG tube is present entering the stomach with tip off the field of view. An endotracheal tube and left central venous catheter have been removed. Mild pulmonary vascular congestion is again noted. Improved bibasilar aeration identified. There is no evidence of pneumothorax, large pleural effusion or definite focal airspace disease.  IMPRESSION: Improved aeration and decreased bibasilar opacities/atelectasis.  Endotracheal tube and left central venous catheter removal.   Original Report Authenticated By: Harmon Pier, M.D.     Labs: BMET  Lab 01/15/12 0440 01/13/12 0455 01/12/12 0420 01/10/12 0435 01/09/12  0600  NA 145 143 143 142 141  K 3.5 3.8 3.7 4.2 4.0  CL 108 106 104 105 105  CO2 25 23 24 19 24   GLUCOSE 275* 575* 280* 141* 84  BUN 49* 62* 44* 52* 38*  CREATININE 5.29* 6.34* 5.34* 5.47* 4.24*  ALB -- -- -- -- --  CALCIUM 9.2 8.9 8.8 9.1 8.8  PHOS 2.7 3.9 5.5* -- --   CBC  Lab 01/15/12 0440 01/14/12 0442 01/13/12 0455 01/12/12 0425  WBC 9.0 7.7 8.1 6.3  NEUTROABS -- -- -- --  HGB 10.1* 10.3* 10.5* 10.5*  HCT 32.4* 32.1* 33.0* 32.2*  MCV 83.1 82.9 81.9 81.9  PLT 491* 431* 401* 314    Medications:       . antiseptic oral rinse  15 mL Mouth Rinse QID  . carvedilol  6.25 mg Per Tube BID WC  . chlorhexidine  15 mL Mouth Rinse BID  . insulin aspart  0-15 Units Subcutaneous Q4H  . insulin glargine  26 Units Subcutaneous Daily  . lactulose  20 g Per Tube BID  . pantoprazole sodium  40 mg Per Tube Daily  . [COMPLETED] warfarin  6 mg Per Tube ONCE-1800  . Warfarin - Pharmacist Dosing Inpatient   Does not apply q1800  . [DISCONTINUED] insulin glargine  20 Units Subcutaneous Daily     Assessment/ Plan: 71 year old BM with acute on CRF in the setting of saddle PE and arrest.  He has been HD requiring  1. Acute renal  failure: timeline of events appear to favor ischemic ATN with cardiorespiratory arrest/renal hypoperfusion injury. Urine output continues to be nonoliguric. Last intermittent HD treatment was Wednesday, he tolerated well.  Has temp HD catheter in right IJ been in for 10 days.    I am still  hopeful for eventual improvement at least in renal status.   I am currently dialyzing him on a PRN basis.  This week was done Sunday and Wednesday.  Will check labs in AM.  Baseline CKD, creatinine in the mid ones it appears. This is a difficult management dillema, if he does not improve neurologically he is really not a candidate for chronic dialysis.  I am really hoping that kidney function turns around.  He also needs to have the vascath removed because it has been in 10 days.  I will  check labs tomorrow and remove VC tomorrow but he may need one more treatment before it is discontinued.    2. Saddle pulmonary embolus: hemodynamically stable and on anti-coagulation with heparin per pharmacy.  3. Elevated LFTs: Likely shock liver- monitor with improving BP  4. S/p cardiorespiratory arrest: Neurology notes reviewed with regards to his altered mental status-plans noted for EEG/MRI. Neurologic status is concerning and likely will drive our course here. MRI shows nothing acute.  5.  Anemia- reasonable over 10, no aranesp - will follow 6. Volume- euvolemic to slightly overloaded.  No diuretics for now, in nonoliguric phase 7 Elytes- no significant abnormalities, will follow.  4 k bath with HD when done   Annie Sable, MD 01/15/2012, 8:46 AM

## 2012-01-16 LAB — CBC
HCT: 29.8 % — ABNORMAL LOW (ref 39.0–52.0)
MCHC: 30.9 g/dL (ref 30.0–36.0)
RDW: 18.1 % — ABNORMAL HIGH (ref 11.5–15.5)
WBC: 7 10*3/uL (ref 4.0–10.5)

## 2012-01-16 LAB — HEPARIN LEVEL (UNFRACTIONATED): Heparin Unfractionated: 0.5 IU/mL (ref 0.30–0.70)

## 2012-01-16 LAB — RENAL FUNCTION PANEL
Albumin: 2.4 g/dL — ABNORMAL LOW (ref 3.5–5.2)
BUN: 61 mg/dL — ABNORMAL HIGH (ref 6–23)
Calcium: 9.5 mg/dL (ref 8.4–10.5)
Chloride: 115 mEq/L — ABNORMAL HIGH (ref 96–112)
Creatinine, Ser: 5.77 mg/dL — ABNORMAL HIGH (ref 0.50–1.35)
GFR calc non Af Amer: 9 mL/min — ABNORMAL LOW (ref >90)
Phosphorus: 2.7 mg/dL (ref 2.3–4.6)

## 2012-01-16 LAB — PROTIME-INR
INR: 1.75 — ABNORMAL HIGH (ref 0.00–1.49)
Prothrombin Time: 19.8 seconds — ABNORMAL HIGH (ref 11.6–15.2)

## 2012-01-16 LAB — GLUCOSE, CAPILLARY
Glucose-Capillary: 230 mg/dL — ABNORMAL HIGH (ref 70–99)
Glucose-Capillary: 252 mg/dL — ABNORMAL HIGH (ref 70–99)
Glucose-Capillary: 262 mg/dL — ABNORMAL HIGH (ref 70–99)
Glucose-Capillary: 297 mg/dL — ABNORMAL HIGH (ref 70–99)

## 2012-01-16 MED ORDER — FREE WATER
200.0000 mL | Status: DC
  Administered 2012-01-16 – 2012-01-17 (×4): 200 mL

## 2012-01-16 MED ORDER — INSULIN GLARGINE 100 UNIT/ML ~~LOC~~ SOLN
29.0000 [IU] | Freq: Every day | SUBCUTANEOUS | Status: DC
  Administered 2012-01-17 – 2012-01-21 (×5): 29 [IU] via SUBCUTANEOUS

## 2012-01-16 NOTE — Progress Notes (Signed)
[facility] [place] TEAM 1 - Stepdown/ICU TEAM  [name] AVW:[id number] DOB: [birth year] DOA: 01/04/2012 PCP: No primary provider on file.  HPI: Noah Torres is an 71 y.o. male who was admitted at [facility] due to SOB and was found to have a PE and DVT. While hospitalized patient developed complications of ARF, acidosis and shock requiring intubation. After intubation went into PEA arrest, was given TPA and required resuscitation for about 7 minutes before ROSC was obtained. Patient was transferred here due to renal deterioration and need for dialysis. Has had improvement in renal status and has been extubated but has not returned to baseline mental status. In work up, CT of the head was performed that shows evidence of small vessel ischemic changes and large ventricles suggestive of NPH.   Tests / Events:  11/11 - Saddle PE noted on CT-A, started anticoagulation  11/12 - Echo shows PA pressure 58-60; Doppler shows LLE DVT  11/13 - Develops shock started on pressors, develops resp failure intubated, cardiac arrest - given TPA and resuscitated  11/14 - AKI (Creat increa 1.5-->2.7)  11/16 - GRN in sputum, concern for BLL PNA, start on rocephin  11/17 - stopped all pressors  11/18 - transferred to [facility] and started on CRRT  11/20 - Multiple episodes of bradycardia/hypotension 2/2 precedex use, so med stopped  11/21 - Successfully extubated, no improvement in mental status  11/22 - CT shows no stroke, possible normal pressure hydrocephalus   Assessment/Plan:  LLE DVT w/ Saddle Pulmonary Embolus w/ cardio-respiratory collapse Required TPA at outside hospital - cont heparin->>Coumadin per pharmacy - At this point given that patient will require tube feeds given his neurological condition and inability to feed himself we will need to hold coumadin and as such will plan on continuing heparin with plans on holding heparin 4 hours prior to procedure (gtube placement)  Acute  renal failure / ATN on CKD (baseline crt 1.9) Ischemic ATN related to cardiac arrest - Nephrology is following - intermittently being dialyzed. Would like to thank Nephrology for their input and they raise an important point in that SNF most likely will not be able to facilitate intermittent dialysis.  As such I will place order for social work consult for placement into LTAC.  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  Discussed with son who agrees to proceed with feeding tube placement.  transaminitis / shock liver LFTs on last check 01/15/12 back at baseline.  Tachycardia  Due to PE - on metoprolol  HTN BP relatively well controlled   DM 2 Experienced some hypoglycemia 11/27 - now CBG elevated - Will increase Lantus from 26 units to 29 units.  S/P Cardiac arrest (PEA) at outside hospital  Severe protein calorie malnutrition Continue tube feeds - will make plans and have discussed case with Interventional radiology and son for peg tub placement.  Once INR < 1.5 patient will be able to have peg tub placed.  Reported GNR on sputum at OSH, s/p Rocephin x 3 days Restart antibiotics if persistant fever  Code Status: full code Family Communication: spoke w/ son [name] at [phone] Disposition Plan: stable for transfer to tele bed - ultimate plan is for SNF placement DVT prophylaxis: Heparin infusion  Consultants:  Nephro  Neuro   Antibiotics:  Ceftriaxone 11/16>>>11/18  HPI/Subjective: The  patient will open his eyes and turn to the examiner.  He will still not answer questions or follow simple commands.  There is no family present at the time of my evaluation.  Objective: Filed Vitals:   01/15/12 1836 01/15/12 1933 01/16/12 0321 01/16/12 1343  BP: 154/76 138/84 151/91 147/83    Pulse: 89 88 92 102  Temp:  98.5 F (36.9 C) 98.8 F (37.1 C) 98.7 F (37.1 C)  TempSrc:  Oral Oral Oral  Resp:  18 18 16   Height:      Weight:   82.4 kg (181 lb 10.5 oz)   SpO2:  100% 100% 100%    Intake/Output Summary (Last 24 hours) at 01/16/12 1422 Last data filed at 01/16/12 1344  Gross per 24 hour  Intake    240 ml  Output   2500 ml  Net  -2260 ml    Exam:  General: No acute respiratory distress at rest  Lungs: coarse breath sounds BL, no wheezes Cardiovascular: RRR no MRG Abdomen: Nontender, nondistended, soft, bowel sounds positive, no rebound, no ascites, no appreciable mass Extremities: No significant cyanosis, clubbing, or edema bilateral lower extremities Neuro: Patient gazes in examiners general direction but does not maintain gaze and seems to have non purposeful movements.  Data Reviewed: Basic Metabolic Panel:  Lab 01/16/12 2440 01/15/12 0440 01/13/12 0455 01/12/12 0420 01/10/12 0435  NA 154* 145 143 143 142  K 3.9 3.5 3.8 3.7 4.2  CL 115* 108 106 104 105  CO2 26 25 23 24 19   GLUCOSE 255* 275* 575* 280* 141*  BUN 61* 49* 62* 44* 52*  CREATININE 5.77* 5.29* 6.34* 5.34* 5.47*  CALCIUM 9.5 9.2 8.9 8.8 9.1  MG -- -- -- -- --  PHOS 2.7 2.7 3.9 5.5* --   Liver Function Tests:  Lab 01/16/12 0425 01/15/12 0440 01/13/12 0455 01/12/12 0425 01/12/12 0420 01/10/12 1152  AST -- 36 -- 44* -- --  ALT -- 53 -- 85* -- 125*  ALKPHOS -- 120* -- 120* -- --  BILITOT -- 0.4 -- 0.5 -- --  PROT -- 7.7 -- 7.0 -- --  ALBUMIN 2.4* 2.4*2.5* 2.3* 2.2* 2.2* --   CBC:  Lab 01/16/12 0425 01/15/12 0440 01/14/12 0442 01/13/12 0455 01/12/12 0425  WBC 7.0 9.0 7.7 8.1 6.3  NEUTROABS -- -- -- -- --  HGB 9.2* 10.1* 10.3* 10.5* 10.5*  HCT 29.8* 32.4* 32.1* 33.0* 32.2*  MCV 85.1 83.1 82.9 81.9 81.9  PLT 486* 491* 431* 401* 314   CBG:  Lab 01/16/12 1217 01/16/12 0834 01/16/12 0348 01/16/12 0058 01/16/12 0007  GLUCAP 262* 252* 238* 266* 297*    No results found for this  or any previous visit (from the past 240 hour(s)).   _______________________________________________________________________  Noah Torres  [facility] Office  [phone] Pager [phone]  On-Call/Text Page:      [email or web address]      password [email or web address]  01/16/2012, 2:22 PM  LOS: 12 days

## 2012-01-16 NOTE — Progress Notes (Signed)
Patient ID: Noah Torres, male   DOB: Mar 17, 1940, 71 y.o.   MRN: [id number]   [facility] Progress Note    Subjective:   Good UOP overnight, 1600.  Neurologically still very poor.    Objective:   BP 151/91  Pulse 92  Temp 98.8 F (37.1 C) (Oral)  Resp 18  Ht 5\' 10"  (1.778 m)  Wt 82.4 kg (181 lb 10.5 oz)  BMI 26.07 kg/m2  SpO2 100%  Intake/Output Summary (Last 24 hours) at 01/16/12 0932 Last data filed at 01/16/12 0525  Gross per 24 hour  Intake    480 ml  Output   1650 ml  Net  -1170 ml   Weight change:   Physical Exam: Gen: Somnolent and resting bed, opens eyes to voice/touch. No verbal responses. No commands,  CVS: Pulse regular tachycardia, heart sounds S1 and S2 normal Resp: Coarse breath sounds bilaterally-appear transmitted. No rales/rhonchi Abd: Soft, flat, nontender and bowel sounds are normal Ext: 1+ edema over lower extremities.  Right sided vascath   Imaging: No results found.  Labs: BMET  Lab 01/16/12 0425 01/15/12 0440 01/13/12 0455 01/12/12 0420 01/10/12 0435  NA 154* 145 143 143 142  K 3.9 3.5 3.8 3.7 4.2  CL 115* 108 106 104 105  CO2 26 25 23 24 19   GLUCOSE 255* 275* 575* 280* 141*  BUN 61* 49* 62* 44* 52*  CREATININE 5.77* 5.29* 6.34* 5.34* 5.47*  ALB -- -- -- -- --  CALCIUM 9.5 9.2 8.9 8.8 9.1  PHOS 2.7 2.7 3.9 5.5* --   CBC  Lab 01/16/12 0425 01/15/12 0440 01/14/12 0442 01/13/12 0455  WBC 7.0 9.0 7.7 8.1  NEUTROABS -- -- -- --  HGB 9.2* 10.1* 10.3* 10.5*  HCT 29.8* 32.4* 32.1* 33.0*  MCV 85.1 83.1 82.9 81.9  PLT 486* 491* 431* 401*    Medications:       . antiseptic oral rinse  15 mL Mouth Rinse QID  . carvedilol  6.25 mg Per Tube BID WC  . chlorhexidine  15 mL Mouth Rinse BID  . insulin aspart  0-15 Units Subcutaneous Q4H  . insulin glargine  26 Units Subcutaneous Daily  . lactulose  20 g Per Tube BID  . pantoprazole sodium  40 mg Per Tube Daily  . [DISCONTINUED] warfarin  6 mg Oral q1800  .  [DISCONTINUED] Warfarin - Pharmacist Dosing Inpatient   Does not apply q1800  . [DISCONTINUED] Warfarin - Pharmacist Dosing Inpatient   Does not apply q1800     Assessment/ Plan: 70 year old BM with acute on CRF in the setting of saddle PE and arrest.  He has been HD requiring  1. Acute renal failure: timeline of events appear to favor ischemic ATN with cardiorespiratory arrest/renal hypoperfusion injury. Urine output continues to be nonoliguric. Last intermittent HD treatment was Wednesday, he tolerated well.  Has temp HD catheter in right IJ been in for 11 days.    I am still  hopeful for eventual improvement at least in renal status.   I am currently dialyzing him on a PRN basis.  This week was done Sunday and Wednesday so far.  Labs today do not indicate an acute need for dialysis support. Rate of rise is lower and  In fact, with an elevated sodium he is maybe a little dry and I will add back free water.    Baseline CKD, creatinine in the mid ones it appears. This is a difficult management dillema, if he  does not improve neurologically he is  not a candidate for chronic dialysis.  I am really hoping that kidney function turns around.  He also needs to have the vascath removed because it has been in 11 days. Nursing is still using the Vascath for heparin administration.  I have told them to look for peripheral access so that old central line can be removed.  If further HD is desired next week he will need a PC but I think that palliative care would need to be involved before we took that step.  He would not be able to be discharged to a SNF with AKI requiring HD, the only option would be an LTAC 2. Saddle pulmonary embolus: hemodynamically stable and on anti-coagulation with heparin per pharmacy.  3. Elevated LFTs: Likely shock liver- monitor with improving BP  4. S/p cardiorespiratory arrest: Neurology notes reviewed with regards to his altered mental status-plans noted for EEG/MRI. Neurologic status is  concerning and likely will drive our course here. MRI shows nothing acute.  5.  Anemia- reasonable over 9, will add aranesp and follow 6. Volume- euvolemic to slightly dry.  No diuretics for now, in nonoliguric phase 7 Elytes- no significant abnormalities except sodium, will give free water to correct.    Annie Sable, MD 01/16/2012, 9:32 AM

## 2012-01-16 NOTE — Progress Notes (Signed)
ANTICOAGULATION CONSULT NOTE - Follow Up Consult  Pharmacy Consult for Heparin Indication: pulmonary embolus  Allergies  Allergen Reactions  . Metformin And Related    Labs:  Basename 01/16/12 0425 01/15/12 0440 01/14/12 0442  HGB 9.2* 10.1* --  HCT 29.8* 32.4* 32.1*  PLT 486* 491* 431*  APTT -- -- --  LABPROT 19.8* 21.5* 22.2*  INR 1.75* 1.95* 2.04*  HEPARINUNFRC 0.50 0.41 0.47  CREATININE 5.77* 5.29* --  CKTOTAL -- -- --  CKMB -- -- --  TROPONINI -- -- --   Estimated Creatinine Clearance: 12.3 ml/min (by C-G formula based on Cr of 5.77).  Assessment: Saddle PE:  Continuing Heparin bridging to Coumadin.  Heparin level is therapeutic at 0.50.    Coumadin on hold for feeding tube placement, INR down to 1.75 (needs to be less than 1.5)  CBC stable  Goal of Therapy:  INR 2-3 Heparin level 0.3-0.7 units/ml Monitor platelets by anticoagulation protocol: Yes   Plan:  Continue Heparin at 1400 units/hr - Follow up AM heparin level, CBC, protime    Thank you. Okey Regal, PharmD [phone] 01/16/2012, 9:16 AM

## 2012-01-17 LAB — PROTIME-INR
INR: 1.52 — ABNORMAL HIGH (ref 0.00–1.49)
Prothrombin Time: 17.9 seconds — ABNORMAL HIGH (ref 11.6–15.2)

## 2012-01-17 LAB — GLUCOSE, CAPILLARY
Glucose-Capillary: 151 mg/dL — ABNORMAL HIGH (ref 70–99)
Glucose-Capillary: 175 mg/dL — ABNORMAL HIGH (ref 70–99)
Glucose-Capillary: 247 mg/dL — ABNORMAL HIGH (ref 70–99)

## 2012-01-17 LAB — RENAL FUNCTION PANEL
Albumin: 2.4 g/dL — ABNORMAL LOW (ref 3.5–5.2)
CO2: 23 mEq/L (ref 19–32)
Calcium: 9.3 mg/dL (ref 8.4–10.5)
Creatinine, Ser: 5.87 mg/dL — ABNORMAL HIGH (ref 0.50–1.35)
GFR calc Af Amer: 10 mL/min — ABNORMAL LOW (ref >90)
GFR calc non Af Amer: 9 mL/min — ABNORMAL LOW (ref >90)
Phosphorus: 3 mg/dL (ref 2.3–4.6)
Sodium: 152 mEq/L — ABNORMAL HIGH (ref 135–145)

## 2012-01-17 LAB — CBC
MCV: 83.9 fL (ref 78.0–100.0)
Platelets: 525 10*3/uL — ABNORMAL HIGH (ref 150–400)
RBC: 3.86 MIL/uL — ABNORMAL LOW (ref 4.22–5.81)
RDW: 18 % — ABNORMAL HIGH (ref 11.5–15.5)
WBC: 8 10*3/uL (ref 4.0–10.5)

## 2012-01-17 LAB — HEPARIN LEVEL (UNFRACTIONATED): Heparin Unfractionated: 0.58 IU/mL (ref 0.30–0.70)

## 2012-01-17 MED ORDER — CEFAZOLIN SODIUM-DEXTROSE 2-3 GM-% IV SOLR
2.0000 g | INTRAVENOUS | Status: AC
  Administered 2012-01-18: 2 g via INTRAVENOUS
  Filled 2012-01-17: qty 50

## 2012-01-17 MED ORDER — FREE WATER
400.0000 mL | Status: DC
  Administered 2012-01-17 (×2): 400 mL

## 2012-01-17 MED ORDER — HEPARIN (PORCINE) IN NACL 100-0.45 UNIT/ML-% IJ SOLN
1400.0000 [IU]/h | INTRAMUSCULAR | Status: DC
  Filled 2012-01-17 (×3): qty 250

## 2012-01-17 MED ORDER — LACTATED RINGERS IV BOLUS (SEPSIS): 500.0000 mL | Freq: Once | INTRAVENOUS | Status: DC

## 2012-01-17 MED ORDER — CARVEDILOL 12.5 MG PO TABS
12.5000 mg | ORAL_TABLET | Freq: Two times a day (BID) | ORAL | Status: DC
  Administered 2012-01-17 – 2012-01-21 (×7): 12.5 mg
  Filled 2012-01-17 (×10): qty 1

## 2012-01-17 MED ORDER — FREE WATER
500.0000 mL | Status: DC
  Administered 2012-01-17 – 2012-01-21 (×21): 500 mL

## 2012-01-17 MED ORDER — LACTATED RINGERS IV SOLN: INTRAVENOUS | Status: DC

## 2012-01-17 NOTE — Progress Notes (Signed)
Pt given barium preparation per IR.  Tolerated well.  Quorra Rosene, [facility]

## 2012-01-17 NOTE — Clinical Social Work Note (Signed)
CSW following for SNF however pt unlikely SNF candidate due to intermittent dialysis. Received consult for LTAC. CSW notified w/e RNCM of LTAC consult. Unit-basedCSW will continue follow for appropriate d/c plan.  Dellie Burns, MSW, LCSWA [phone] (Weekends 8:00am-4:30pm)

## 2012-01-17 NOTE — Progress Notes (Signed)
Patient ID: Noah Torres, male   DOB: 19-Sep-1940, 71 y.o.   MRN: [id number]   [facility] Progress Note    Subjective:   Good UOP overnight, 1900.  Neurologically still very poor. Sodium still high.  BUN creatinine up but rate of rise again lower   Objective:   BP 172/98  Pulse 108  Temp 99.1 F (37.3 C) (Axillary)  Resp 18  Ht 5\' 10"  (1.778 m)  Wt 82.4 kg (181 lb 10.5 oz)  BMI 26.07 kg/m2  SpO2 98%  Intake/Output Summary (Last 24 hours) at 01/17/12 0913 Last data filed at 01/17/12 0700  Gross per 24 hour  Intake   1488 ml  Output   1975 ml  Net   -487 ml   Weight change:   Physical Exam: Gen: Somnolent and resting bed, opens eyes to voice/touch. No verbal responses. No commands,  CVS: Pulse regular tachycardia, heart sounds S1 and S2 normal Resp: Coarse breath sounds bilaterally-appear transmitted. No rales/rhonchi Abd: Soft, flat, nontender and bowel sounds are normal Ext: 1+ edema over lower extremities.  Right sided vascath using for heparin  Imaging: No results found.  Labs: BMET  Lab 01/17/12 0505 01/16/12 0425 01/15/12 0440 01/13/12 0455 01/12/12 0420  NA 152* 154* 145 143 143  K 4.1 3.9 3.5 3.8 3.7  CL 116* 115* 108 106 104  CO2 23 26 25 23 24   GLUCOSE 278* 255* 275* 575* 280*  BUN 70* 61* 49* 62* 44*  CREATININE 5.87* 5.77* 5.29* 6.34* 5.34*  ALB -- -- -- -- --  CALCIUM 9.3 9.5 9.2 8.9 8.8  PHOS 3.0 2.7 2.7 3.9 5.5*   CBC  Lab 01/17/12 0505 01/16/12 0425 01/15/12 0440 01/14/12 0442  WBC 8.0 7.0 9.0 7.7  NEUTROABS -- -- -- --  HGB 10.0* 9.2* 10.1* 10.3*  HCT 32.4* 29.8* 32.4* 32.1*  MCV 83.9 85.1 83.1 82.9  PLT 525* 486* 491* 431*    Medications:       . antiseptic oral rinse  15 mL Mouth Rinse QID  . carvedilol  6.25 mg Per Tube BID WC  . chlorhexidine  15 mL Mouth Rinse BID  . free water  200 mL Per Tube Q4H  . insulin aspart  0-15 Units Subcutaneous Q4H  . insulin glargine  29 Units Subcutaneous Daily  .  lactulose  20 g Per Tube BID  . pantoprazole sodium  40 mg Per Tube Daily  . [DISCONTINUED] insulin glargine  26 Units Subcutaneous Daily     Assessment/ Plan: 71 year old BM with acute on CRF in the setting of saddle PE and arrest.  He has been HD requiring  1. Acute renal failure: timeline of events appear to favor ischemic ATN with cardiorespiratory arrest/renal hypoperfusion injury. Urine output continues to be nonoliguric. Last intermittent HD treatment was Wednesday, he tolerated well.  Has temp HD catheter in right IJ been in for 12 days.  He has been intermittently dialysis dependent during those 12 days.   I am still  hopeful for eventual improvement at least in renal status.   I am currently dialyzing him on a PRN basis.  This week was done Sunday (11/24)  and Wednesday (11/27)so far.  Labs today do not indicate an acute need for dialysis support. Rate of rise is lower and  In fact, with an elevated sodium he is maybe a little dry and I am adding  back free water which may help BUN.    Baseline CKD, creatinine  in the mid ones it appears. This is a difficult management dillema, if he does not improve neurologically he is  not a candidate for chronic dialysis.  I am really hoping that kidney function turns around.  He also needs to have the vascath removed because it has been in 12 days. Nursing is still using the Vascath for heparin administration.  I have told them to look for peripheral access so that old central line can be removed.  If further HD is desired next week he will need a PC but I think that palliative care would need to be involved before we took that step. The other thing is that  he will not be able to be discharged to a SNF with AKI requiring HD, the only option would be an LTAC at this time 2. Saddle pulmonary embolus: hemodynamically stable and on anti-coagulation with heparin per pharmacy.  3. Elevated LFTs: Likely shock liver- monitor with improving BP  4. S/p  cardiorespiratory arrest: Neurology notes reviewed with regards to his altered mental status-plans noted for EEG/MRI. Neurologic status is concerning and likely will drive our course here. MRI shows nothing acute.  5.  Anemia- reasonable over 9, on aranesp will follow 6. Volume- euvolemic to slightly dry.  No diuretics for now, in nonoliguric phase.  BP is a little high will titrate coreg upwards. 7 Elytes- no significant abnormalities except sodium, will giving free water to correct, to increase dose today.      Annie Sable, MD 01/17/2012, 9:13 AM

## 2012-01-17 NOTE — Progress Notes (Signed)
TIP OF RIGHT IJ HDC INTACT WITH REMOVAL

## 2012-01-17 NOTE — Progress Notes (Signed)
ORDER TO REMOVE RIGHT IJ HDC, SUTURES REMOVED, CATHETER REMOVED WITHOUT DIFFICULTY, VASELINE GAUZE AND HYPOFIX PRESSURE DRESSING APPLIED WITH 10 MINUTES OF MANUAL PRESSURE HELD AT SITE, NO ACTIVE BLEEDING NOTED

## 2012-01-17 NOTE — Progress Notes (Signed)
Patient ID: Noah Torres, male   DOB: [birth year], 71 y.o.   MRN: [id number] Request received for percutaneous gastrostomy tube placement on pt secondary to poor oral intake/protein calorie malnutrition, encephalopathy. Pt previously on coumadin for LLE DVT/PE, now on IV heparin. Additional PMH as below. Exam: chest- coarse BS bilat.; heart- tachy but reg; abd- soft, +BS,ND; ext- 1+ edema.     Filed Vitals:   01/16/12 1343 01/16/12 1643 01/16/12 2200 01/17/12 0445  BP: 147/83 156/90 141/77 172/98  Pulse: 102 113 104 108  Temp: 98.7 F (37.1 C)   99.1 F (37.3 C)  TempSrc: Oral   Axillary  Resp: 16  22 18   Height:      Weight:      SpO2: 100%  92% 98%   Past Medical History  Diagnosis Date  . Diabetes mellitus, type 2   . Hypercholesterolemia   . Former smoker   . Bilateral hearing loss     Wears hearing aids  . Chronic kidney disease   . Hearing loss     uses hearing aids   Past Surgical History  Procedure Date  . Spine surgery    Ct Head Wo Contrast  01/08/2012  *RADIOLOGY REPORT*  Clinical Data: Altered mental status.  TPA for pulmonary embolism.  CT HEAD WITHOUT CONTRAST  Technique:  Contiguous axial images were obtained from the base of the skull through the vertex without contrast.  Comparison: None.  Findings: Ventricular enlargement out of proportion to the cortical atrophy raises question of normal pressure hydrocephalus.  No acute stroke or bleed.  No extra-axial fluid collection.  Calvarium intact.  Vascular calcification.  Clear sinuses and mastoids. Right cataract extraction.  IMPRESSION: Ventricular enlargement out of portion to cortical atrophy. Question normal pressure hydrocephalus.  No acute stroke or bleed.   Original Report Authenticated By: Davonna Belling, M.D.    Mr Brain Wo Contrast  01/11/2012  *RADIOLOGY REPORT*  Clinical Data: Altered mental status  MRI HEAD WITHOUT CONTRAST  Technique:  Multiplanar, multiecho pulse sequences of the brain and surrounding  structures were obtained according to standard protocol without intravenous contrast.  Comparison: CT head 01/08/2012  Findings: Moderate ventricular enlargement is unchanged.  The third and lateral ventricles have a  rounded appearance.  There is generalized atrophy with prominence of the sulci and cortical atrophy.  Ventricular enlargement is most likely due to cerebral atrophy.  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  Mild chronic ischemic changes involving the left caudate and external capsule.  Small chronic infarct left parietal lobe.  No acute infarct.  Negative for hemorrhage or mass lesion.  Chronic sinusitis.  IMPRESSION: Ventricular enlargement, unchanged.  This is most likely due to global atrophy however communicating hydrocephalus could be present also.  Mild chronic ischemic change.  No acute infarct.   Original Report Authenticated By: Janeece Riggers, M.D.    US Renal Port  01/04/2012  *RADIOLOGY REPORT*  Clinical Data: Acute renal failure.  RENAL/URINARY TRACT ULTRASOUND COMPLETE  Comparison:  None  Findings:  Right Kidney:  11.4 cm in length.  Normal renal cortical thickness but slight increased echogenicity.  No focal renal lesions or hydronephrosis.  Left Kidney:  10.0 cm in length.  Normal renal cortical thickness and slight increased echogenicity but no focal renal lesion or hydronephrosis.  Bladder:  Decompressed by a Foley catheter.  IMPRESSION:  Slight increased echogenicity of both kidneys but no hydronephrosis.   Original Report Authenticated By: Rudie Meyer, M.D.    Dg Chest [facility]  1 View  01/13/2012  *RADIOLOGY REPORT*  Clinical Data: 71 year old male with shortness of breath and congestion.  PORTABLE CHEST - 1 VIEW  Comparison: 01/07/2012 and prior chest radiographs dating back to 01/04/2012.  Findings: Cardiomediastinal silhouette is stable. A right IJ central venous catheter is again identified with tip overlying the mid SVC. An NG tube is present entering the stomach  with tip off the field of view. An endotracheal tube and left central venous catheter have been removed. Mild pulmonary vascular congestion is again noted. Improved bibasilar aeration identified. There is no evidence of pneumothorax, large pleural effusion or definite focal airspace disease.  IMPRESSION: Improved aeration and decreased bibasilar opacities/atelectasis.  Endotracheal tube and left central venous catheter removal.   Original Report Authenticated By: Harmon Pier, M.D.    Dg Chest Port 1 View  01/07/2012  *RADIOLOGY REPORT*  Clinical Data: Assess endotracheal tube.  PORTABLE CHEST - 1 VIEW  Comparison: 01/06/2012  Findings: Endotracheal tube is 2.6 cm above the carina.  Right jugular central venous catheter in the upper SVC region. Persistent bibasilar densities may represent consolidation and pleural fluid.  Prominent interstitial markings are suggestive for mild edema.  Heart size is stable.  No evidence for a pneumothorax. Left jugular central venous catheter tip is near the junction of the left innominate vein and SVC. Nasogastric tube extends into the abdomen.  IMPRESSION: Stable chest radiograph findings.  Suspect pulmonary edema and pleural effusions.  Stable position of support apparatuses.   Original Report Authenticated By: Richarda Overlie, M.D.    Dg Chest Port 1 View  01/06/2012  *RADIOLOGY REPORT*  Clinical Data: Endotracheal tube placement, shortness of breath.  PORTABLE CHEST - 1 VIEW  Comparison: 01/05/2012.  Findings: Endotracheal tube is in satisfactory position. Nasogastric tube is followed into the stomach with the tip projecting beyond the inferior boundary of the film.  Right IJ central line tip projects over the SVC.  Left IJ central line tip projects in the region of the left brachiocephalic vein.  Heart size stable. Bilateral mid/lower lung zone airspace disease and left lower lobe collapse/consolidation persist.  Bilateral pleural effusions.  Biapical pleural thickening.   IMPRESSION: Probable congestive heart failure with left lower lobe collapse/consolidation.   Original Report Authenticated By: Leanna Battles, M.D.    Dg Chest Port 1 View  01/04/2012  *RADIOLOGY REPORT*  Clinical Data: 71 year old male status post placement of catheter for dialysis.  PORTABLE CHEST - 1 VIEW  Comparison: None.  Findings: Semi upright AP portable view at 1604 hours. Endotracheal tube tip between level of clavicles and carina. Enteric tube courses to the abdomen, tip not included.  Left IJ approach single lumen catheter, tip terminates to the right of the midline, probably at the innominate vein confluence.  Right IJ approach dual lumen dialysis type catheter, tip just below the level of the carina.   Cardiac size and mediastinal contours are within normal limits. Dense retrocardiac opacity.  Probable small effusions.  Pulmonary vascular congestion without overt edema.  No pneumothorax identified.  IMPRESSION: 1.  Right IJ dual lumen catheter placed, tip at the SVC level. 2.  Other lines and tubes placed as above. 3.  No pneumothorax. Small bilateral pleural effusions.  Vascular congestion.  Bilateral lower lobe collapse or consolidation.   Original Report Authenticated By: Erskine Speed, M.D.   Results for orders placed during the hospital encounter of 01/04/12  MRSA PCR SCREENING      Component Value Range   MRSA by PCR  NEGATIVE  NEGATIVE  URINALYSIS, ROUTINE W REFLEX MICROSCOPIC      Component Value Range   Color, Urine YELLOW  YELLOW   APPearance CLOUDY (*) CLEAR   Specific Gravity, Urine 1.010  1.005 - 1.030   pH 7.0  5.0 - 8.0   Glucose, UA 100 (*) NEGATIVE mg/dL   Hgb urine dipstick LARGE (*) NEGATIVE   Bilirubin Urine NEGATIVE  NEGATIVE   Ketones, ur NEGATIVE  NEGATIVE mg/dL   Protein, ur 30 (*) NEGATIVE mg/dL   Urobilinogen, UA 1.0  0.0 - 1.0 mg/dL   Nitrite NEGATIVE  NEGATIVE   Leukocytes, UA SMALL (*) NEGATIVE  CULTURE, RESPIRATORY      Component Value Range    Specimen Description TRACHEAL ASPIRATE     Special Requests Normal     Gram Stain       Value: FEW WBC PRESENT,BOTH PMN AND MONONUCLEAR     RARE SQUAMOUS EPITHELIAL CELLS PRESENT     FEW GRAM POSITIVE RODS     FEW GRAM POSITIVE COCCI IN PAIRS     RARE YEAST   Culture MODERATE PSEUDOMONAS AERUGINOSA     Report Status 01/07/2012 FINAL     Organism ID, Bacteria PSEUDOMONAS AERUGINOSA    COMPREHENSIVE METABOLIC PANEL      Component Value Range   Sodium 140  135 - 145 mEq/L   Potassium 4.3  3.5 - 5.1 mEq/L   Chloride 104  96 - 112 mEq/L   CO2 19  19 - 32 mEq/L   Glucose, Bld 126 (*) 70 - 99 mg/dL   BUN 68 (*) 6 - 23 mg/dL   Creatinine, Ser 1.61 (*) 0.50 - 1.35 mg/dL   Calcium 6.8 (*) 8.4 - 10.5 mg/dL   Total Protein 5.7 (*) 6.0 - 8.3 g/dL   Albumin 2.0 (*) 3.5 - 5.2 g/dL   AST 096 (*) 0 - 37 U/L   ALT 424 (*) 0 - 53 U/L   Alkaline Phosphatase 135 (*) 39 - 117 U/L   Total Bilirubin 1.1  0.3 - 1.2 mg/dL   GFR calc non Af Amer 7 (*) >90 mL/min   GFR calc Af Amer 8 (*) >90 mL/min  MAGNESIUM      Component Value Range   Magnesium 2.0  1.5 - 2.5 mg/dL  PHOSPHORUS      Component Value Range   Phosphorus 5.7 (*) 2.3 - 4.6 mg/dL  CBC      Component Value Range   WBC 5.2  4.0 - 10.5 K/uL   RBC 3.92 (*) 4.22 - 5.81 MIL/uL   Hemoglobin 10.4 (*) 13.0 - 17.0 g/dL   HCT 04.5 (*) 40.9 - 81.1 %   MCV 80.4  78.0 - 100.0 fL   MCH 26.5  26.0 - 34.0 pg   MCHC 33.0  30.0 - 36.0 g/dL   RDW 91.4 (*) 78.2 - 95.6 %   Platelets 171  150 - 400 K/uL  URINE MICROSCOPIC-ADD ON      Component Value Range   WBC, UA 0-2  <3 WBC/hpf   RBC / HPF 21-50  <3 RBC/hpf   Bacteria, UA RARE  RARE  POCT I-STAT 3, BLOOD GAS (G3+)      Component Value Range   pH, Arterial 7.406  7.350 - 7.450   pCO2 arterial 30.9 (*) 35.0 - 45.0 mmHg   pO2, Arterial 50.0 (*) 80.0 - 100.0 mmHg   Bicarbonate 19.6 (*) 20.0 - 24.0 mEq/L   TCO2 21  0 -  100 mmol/L   O2 Saturation 87.0     Acid-base deficit 5.0 (*) 0.0 - 2.0 mmol/L     Patient temperature 97.4 F     Collection site RADIAL, ALLEN'S TEST ACCEPTABLE     Drawn by Operator     Sample type ARTERIAL    TRIGLYCERIDES      Component Value Range   Triglycerides 239 (*) <150 mg/dL  CBC      Component Value Range   WBC 3.6 (*) 4.0 - 10.5 K/uL   RBC 3.93 (*) 4.22 - 5.81 MIL/uL   Hemoglobin 10.2 (*) 13.0 - 17.0 g/dL   HCT 16.1 (*) 09.6 - 04.5 %   MCV 80.7  78.0 - 100.0 fL   MCH 26.0  26.0 - 34.0 pg   MCHC 32.2  30.0 - 36.0 g/dL   RDW 40.9 (*) 81.1 - 91.4 %   Platelets 142 (*) 150 - 400 K/uL  MAGNESIUM      Component Value Range   Magnesium 2.3  1.5 - 2.5 mg/dL  CREATININE, URINE, RANDOM      Component Value Range   Creatinine, Urine 38.98    SODIUM, URINE, RANDOM      Component Value Range   Sodium, Ur 55    PROTEIN / CREATININE RATIO, URINE      Component Value Range   Creatinine, Urine 40.51     Total Protein, Urine 60.1     PROTEIN CREATININE RATIO 1.48 (*) 0.00 - 0.15  LOW MOLECULAR WGT HEPARIN (FRACTIONATED)      Component Value Range   Heparin LMW 0.19    PROCALCITONIN      Component Value Range   Procalcitonin 4.80    PROTIME-INR      Component Value Range   Prothrombin Time 22.9 (*) 11.6 - 15.2 seconds   INR 2.13 (*) 0.00 - 1.49  PROTIME-INR      Component Value Range   Prothrombin Time 22.0 (*) 11.6 - 15.2 seconds   INR 2.01 (*) 0.00 - 1.49  GLUCOSE, CAPILLARY      Component Value Range   Glucose-Capillary 109 (*) 70 - 99 mg/dL  RENAL FUNCTION PANEL      Component Value Range   Sodium 138  135 - 145 mEq/L   Potassium 4.3  3.5 - 5.1 mEq/L   Chloride 104  96 - 112 mEq/L   CO2 24  19 - 32 mEq/L   Glucose, Bld 79  70 - 99 mg/dL   BUN 50 (*) 6 - 23 mg/dL   Creatinine, Ser 7.82 (*) 0.50 - 1.35 mg/dL   Calcium 7.1 (*) 8.4 - 10.5 mg/dL   Phosphorus 4.7 (*) 2.3 - 4.6 mg/dL   Albumin 1.9 (*) 3.5 - 5.2 g/dL   GFR calc non Af Amer 10 (*) >90 mL/min   GFR calc Af Amer 11 (*) >90 mL/min  RENAL FUNCTION PANEL      Component Value  Range   Sodium 136  135 - 145 mEq/L   Potassium 4.2  3.5 - 5.1 mEq/L   Chloride 101  96 - 112 mEq/L   CO2 24  19 - 32 mEq/L   Glucose, Bld 140 (*) 70 - 99 mg/dL   BUN 40 (*) 6 - 23 mg/dL   Creatinine, Ser 9.56 (*) 0.50 - 1.35 mg/dL   Calcium 7.5 (*) 8.4 - 10.5 mg/dL   Phosphorus 3.7  2.3 - 4.6 mg/dL   Albumin 2.1 (*) 3.5 - 5.2 g/dL  GFR calc non Af Amer 12 (*) >90 mL/min   GFR calc Af Amer 14 (*) >90 mL/min  GLUCOSE, CAPILLARY      Component Value Range   Glucose-Capillary 97  70 - 99 mg/dL   Comment 1 Documented in Chart     Comment 2 Notify RN    GLUCOSE, CAPILLARY      Component Value Range   Glucose-Capillary 81  70 - 99 mg/dL   Comment 1 Documented in Chart     Comment 2 Notify RN    GLUCOSE, CAPILLARY      Component Value Range   Glucose-Capillary 100 (*) 70 - 99 mg/dL  GLUCOSE, CAPILLARY      Component Value Range   Glucose-Capillary 133 (*) 70 - 99 mg/dL   Comment 1 Notify RN    GLUCOSE, CAPILLARY      Component Value Range   Glucose-Capillary 141 (*) 70 - 99 mg/dL  PROTIME-INR      Component Value Range   Prothrombin Time 21.3 (*) 11.6 - 15.2 seconds   INR 1.93 (*) 0.00 - 1.49  GLUCOSE, CAPILLARY      Component Value Range   Glucose-Capillary 118 (*) 70 - 99 mg/dL  HEPARIN LEVEL (UNFRACTIONATED)      Component Value Range   Heparin Unfractionated 1.05 (*) 0.30 - 0.70 IU/mL  CBC      Component Value Range   WBC 3.7 (*) 4.0 - 10.5 K/uL   RBC 3.76 (*) 4.22 - 5.81 MIL/uL   Hemoglobin 9.9 (*) 13.0 - 17.0 g/dL   HCT 16.1 (*) 09.6 - 04.5 %   MCV 79.5  78.0 - 100.0 fL   MCH 26.3  26.0 - 34.0 pg   MCHC 33.1  30.0 - 36.0 g/dL   RDW 40.9 (*) 81.1 - 91.4 %   Platelets 124 (*) 150 - 400 K/uL  RENAL FUNCTION PANEL      Component Value Range   Sodium 137  135 - 145 mEq/L   Potassium 3.8  3.5 - 5.1 mEq/L   Chloride 102  96 - 112 mEq/L   CO2 26  19 - 32 mEq/L   Glucose, Bld 123 (*) 70 - 99 mg/dL   BUN 36 (*) 6 - 23 mg/dL   Creatinine, Ser 7.82 (*) 0.50 - 1.35  mg/dL   Calcium 7.8 (*) 8.4 - 10.5 mg/dL   Phosphorus 4.1  2.3 - 4.6 mg/dL   Albumin 2.1 (*) 3.5 - 5.2 g/dL   GFR calc non Af Amer 16 (*) >90 mL/min   GFR calc Af Amer 19 (*) >90 mL/min  GLUCOSE, CAPILLARY      Component Value Range   Glucose-Capillary 178 (*) 70 - 99 mg/dL  MAGNESIUM      Component Value Range   Magnesium 2.4  1.5 - 2.5 mg/dL  PROTIME-INR      Component Value Range   Prothrombin Time 20.4 (*) 11.6 - 15.2 seconds   INR 1.82 (*) 0.00 - 1.49  GLUCOSE, CAPILLARY      Component Value Range   Glucose-Capillary 140 (*) 70 - 99 mg/dL   Comment 1 Notify RN    HEPARIN LEVEL (UNFRACTIONATED)      Component Value Range   Heparin Unfractionated 1.20 (*) 0.30 - 0.70 IU/mL  GLUCOSE, CAPILLARY      Component Value Range   Glucose-Capillary 111 (*) 70 - 99 mg/dL  GLUCOSE, CAPILLARY      Component Value Range   Glucose-Capillary 122 (*)  70 - 99 mg/dL  GLUCOSE, CAPILLARY      Component Value Range   Glucose-Capillary 128 (*) 70 - 99 mg/dL   Comment 1 Notify RN    HEPARIN LEVEL (UNFRACTIONATED)      Component Value Range   Heparin Unfractionated 0.57  0.30 - 0.70 IU/mL  HEPARIN LEVEL (UNFRACTIONATED)      Component Value Range   Heparin Unfractionated 0.64  0.30 - 0.70 IU/mL  RENAL FUNCTION PANEL      Component Value Range   Sodium 138  135 - 145 mEq/L   Potassium 4.0  3.5 - 5.1 mEq/L   Chloride 102  96 - 112 mEq/L   CO2 26  19 - 32 mEq/L   Glucose, Bld 178 (*) 70 - 99 mg/dL   BUN 31 (*) 6 - 23 mg/dL   Creatinine, Ser 6.96 (*) 0.50 - 1.35 mg/dL   Calcium 8.0 (*) 8.4 - 10.5 mg/dL   Phosphorus 3.8  2.3 - 4.6 mg/dL   Albumin 2.0 (*) 3.5 - 5.2 g/dL   GFR calc non Af Amer 21 (*) >90 mL/min   GFR calc Af Amer 25 (*) >90 mL/min  MAGNESIUM      Component Value Range   Magnesium 2.6 (*) 1.5 - 2.5 mg/dL  PROTIME-INR      Component Value Range   Prothrombin Time 16.9 (*) 11.6 - 15.2 seconds   INR 1.41  0.00 - 1.49  CBC      Component Value Range   WBC 4.5  4.0 - 10.5  K/uL   RBC 4.01 (*) 4.22 - 5.81 MIL/uL   Hemoglobin 10.5 (*) 13.0 - 17.0 g/dL   HCT 29.5 (*) 28.4 - 13.2 %   MCV 80.8  78.0 - 100.0 fL   MCH 26.2  26.0 - 34.0 pg   MCHC 32.4  30.0 - 36.0 g/dL   RDW 44.0 (*) 10.2 - 72.5 %   Platelets 110 (*) 150 - 400 K/uL  HEPARIN LEVEL (UNFRACTIONATED)      Component Value Range   Heparin Unfractionated 0.72 (*) 0.30 - 0.70 IU/mL  GLUCOSE, CAPILLARY      Component Value Range   Glucose-Capillary 161 (*) 70 - 99 mg/dL  GLUCOSE, CAPILLARY      Component Value Range   Glucose-Capillary 123 (*) 70 - 99 mg/dL  RENAL FUNCTION PANEL      Component Value Range   Sodium 137  135 - 145 mEq/L   Potassium 4.4  3.5 - 5.1 mEq/L   Chloride 101  96 - 112 mEq/L   CO2 27  19 - 32 mEq/L   Glucose, Bld 190 (*) 70 - 99 mg/dL   BUN 30 (*) 6 - 23 mg/dL   Creatinine, Ser 3.66 (*) 0.50 - 1.35 mg/dL   Calcium 8.3 (*) 8.4 - 10.5 mg/dL   Phosphorus 3.3  2.3 - 4.6 mg/dL   Albumin 2.2 (*) 3.5 - 5.2 g/dL   GFR calc non Af Amer 28 (*) >90 mL/min   GFR calc Af Amer 32 (*) >90 mL/min  TRIGLYCERIDES      Component Value Range   Triglycerides 296 (*) <150 mg/dL  GLUCOSE, CAPILLARY      Component Value Range   Glucose-Capillary 157 (*) 70 - 99 mg/dL   Comment 1 Documented in Chart     Comment 2 Notify RN    GLUCOSE, CAPILLARY      Component Value Range   Glucose-Capillary 163 (*) 70 - 99  mg/dL   Comment 1 Documented in Chart     Comment 2 Notify RN    GLUCOSE, CAPILLARY      Component Value Range   Glucose-Capillary 134 (*) 70 - 99 mg/dL  HEPARIN LEVEL (UNFRACTIONATED)      Component Value Range   Heparin Unfractionated 0.70  0.30 - 0.70 IU/mL  GLUCOSE, CAPILLARY      Component Value Range   Glucose-Capillary 109 (*) 70 - 99 mg/dL  MAGNESIUM      Component Value Range   Magnesium 2.6 (*) 1.5 - 2.5 mg/dL  PROTIME-INR      Component Value Range   Prothrombin Time 16.3 (*) 11.6 - 15.2 seconds   INR 1.34  0.00 - 1.49  CBC      Component Value Range   WBC 6.3   4.0 - 10.5 K/uL   RBC 4.06 (*) 4.22 - 5.81 MIL/uL   Hemoglobin 10.7 (*) 13.0 - 17.0 g/dL   HCT 16.1 (*) 09.6 - 04.5 %   MCV 81.8  78.0 - 100.0 fL   MCH 26.4  26.0 - 34.0 pg   MCHC 32.2  30.0 - 36.0 g/dL   RDW 40.9 (*) 81.1 - 91.4 %   Platelets 139 (*) 150 - 400 K/uL  HEPARIN LEVEL (UNFRACTIONATED)      Component Value Range   Heparin Unfractionated 0.41  0.30 - 0.70 IU/mL  GLUCOSE, CAPILLARY      Component Value Range   Glucose-Capillary 126 (*) 70 - 99 mg/dL  GLUCOSE, CAPILLARY      Component Value Range   Glucose-Capillary 117 (*) 70 - 99 mg/dL  RENAL FUNCTION PANEL      Component Value Range   Sodium 135  135 - 145 mEq/L   Potassium 4.6  3.5 - 5.1 mEq/L   Chloride 98  96 - 112 mEq/L   CO2 25  19 - 32 mEq/L   Glucose, Bld 86  70 - 99 mg/dL   BUN 25 (*) 6 - 23 mg/dL   Creatinine, Ser 7.82 (*) 0.50 - 1.35 mg/dL   Calcium 8.8  8.4 - 95.6 mg/dL   Phosphorus 2.8  2.3 - 4.6 mg/dL   Albumin 2.6 (*) 3.5 - 5.2 g/dL   GFR calc non Af Amer 29 (*) >90 mL/min   GFR calc Af Amer 33 (*) >90 mL/min  GLUCOSE, CAPILLARY      Component Value Range   Glucose-Capillary 99  70 - 99 mg/dL   Comment 1 Documented in Chart     Comment 2 Notify RN    GLUCOSE, CAPILLARY      Component Value Range   Glucose-Capillary 97  70 - 99 mg/dL   Comment 1 Documented in Chart     Comment 2 Notify RN    GLUCOSE, CAPILLARY      Component Value Range   Glucose-Capillary 87  70 - 99 mg/dL  GLUCOSE, CAPILLARY      Component Value Range   Glucose-Capillary 76  70 - 99 mg/dL  HEPATIC FUNCTION PANEL      Component Value Range   Total Protein 7.0  6.0 - 8.3 g/dL   Albumin 2.4 (*) 3.5 - 5.2 g/dL   AST 80 (*) 0 - 37 U/L   ALT 198 (*) 0 - 53 U/L   Alkaline Phosphatase 123 (*) 39 - 117 U/L   Total Bilirubin 0.7  0.3 - 1.2 mg/dL   Bilirubin, Direct 0.4 (*) 0.0 - 0.3 mg/dL  Indirect Bilirubin 0.3  0.3 - 0.9 mg/dL  GLUCOSE, CAPILLARY      Component Value Range   Glucose-Capillary 76  70 - 99 mg/dL  CBC       Component Value Range   WBC 6.4  4.0 - 10.5 K/uL   RBC 3.70 (*) 4.22 - 5.81 MIL/uL   Hemoglobin 9.7 (*) 13.0 - 17.0 g/dL   HCT 13.0 (*) 86.5 - 78.4 %   MCV 81.4  78.0 - 100.0 fL   MCH 26.2  26.0 - 34.0 pg   MCHC 32.2  30.0 - 36.0 g/dL   RDW 69.6 (*) 29.5 - 28.4 %   Platelets 157  150 - 400 K/uL  HEPARIN LEVEL (UNFRACTIONATED)      Component Value Range   Heparin Unfractionated 0.37  0.30 - 0.70 IU/mL  PROTIME-INR      Component Value Range   Prothrombin Time 16.4 (*) 11.6 - 15.2 seconds   INR 1.35  0.00 - 1.49  GLUCOSE, CAPILLARY      Component Value Range   Glucose-Capillary 61 (*) 70 - 99 mg/dL  GLUCOSE, CAPILLARY      Component Value Range   Glucose-Capillary 104 (*) 70 - 99 mg/dL  GLUCOSE, CAPILLARY      Component Value Range   Glucose-Capillary 76  70 - 99 mg/dL   Comment 1 Documented in Chart     Comment 2 Notify RN    GLUCOSE, CAPILLARY      Component Value Range   Glucose-Capillary 78  70 - 99 mg/dL   Comment 1 Documented in Chart     Comment 2 Notify RN    COMPREHENSIVE METABOLIC PANEL      Component Value Range   Sodium 141  135 - 145 mEq/L   Potassium 4.0  3.5 - 5.1 mEq/L   Chloride 105  96 - 112 mEq/L   CO2 24  19 - 32 mEq/L   Glucose, Bld 84  70 - 99 mg/dL   BUN 38 (*) 6 - 23 mg/dL   Creatinine, Ser 1.32 (*) 0.50 - 1.35 mg/dL   Calcium 8.8  8.4 - 44.0 mg/dL   Total Protein 6.8  6.0 - 8.3 g/dL   Albumin 2.4 (*) 3.5 - 5.2 g/dL   AST 67 (*) 0 - 37 U/L   ALT 165 (*) 0 - 53 U/L   Alkaline Phosphatase 118 (*) 39 - 117 U/L   Total Bilirubin 0.8  0.3 - 1.2 mg/dL   GFR calc non Af Amer 13 (*) >90 mL/min   GFR calc Af Amer 15 (*) >90 mL/min  GLUCOSE, CAPILLARY      Component Value Range   Glucose-Capillary 86  70 - 99 mg/dL  GLUCOSE, CAPILLARY      Component Value Range   Glucose-Capillary 100 (*) 70 - 99 mg/dL  CBC      Component Value Range   WBC 7.7  4.0 - 10.5 K/uL   RBC 3.90 (*) 4.22 - 5.81 MIL/uL   Hemoglobin 10.2 (*) 13.0 - 17.0 g/dL   HCT 10.2  (*) 72.5 - 52.0 %   MCV 81.8  78.0 - 100.0 fL   MCH 26.2  26.0 - 34.0 pg   MCHC 32.0  30.0 - 36.0 g/dL   RDW 36.6 (*) 44.0 - 34.7 %   Platelets 224  150 - 400 K/uL  HEPARIN LEVEL (UNFRACTIONATED)      Component Value Range   Heparin Unfractionated 0.25 (*) 0.30 -  0.70 IU/mL  PROTIME-INR      Component Value Range   Prothrombin Time 18.6 (*) 11.6 - 15.2 seconds   INR 1.61 (*) 0.00 - 1.49  BASIC METABOLIC PANEL      Component Value Range   Sodium 142  135 - 145 mEq/L   Potassium 4.2  3.5 - 5.1 mEq/L   Chloride 105  96 - 112 mEq/L   CO2 19  19 - 32 mEq/L   Glucose, Bld 141 (*) 70 - 99 mg/dL   BUN 52 (*) 6 - 23 mg/dL   Creatinine, Ser 0.34 (*) 0.50 - 1.35 mg/dL   Calcium 9.1  8.4 - 74.2 mg/dL   GFR calc non Af Amer 10 (*) >90 mL/min   GFR calc Af Amer 11 (*) >90 mL/min  GLUCOSE, CAPILLARY      Component Value Range   Glucose-Capillary 122 (*) 70 - 99 mg/dL  GLUCOSE, CAPILLARY      Component Value Range   Glucose-Capillary 105 (*) 70 - 99 mg/dL  GLUCOSE, CAPILLARY      Component Value Range   Glucose-Capillary 122 (*) 70 - 99 mg/dL  HEPARIN LEVEL (UNFRACTIONATED)      Component Value Range   Heparin Unfractionated 0.31  0.30 - 0.70 IU/mL  GLUCOSE, CAPILLARY      Component Value Range   Glucose-Capillary 124 (*) 70 - 99 mg/dL  GLUCOSE, CAPILLARY      Component Value Range   Glucose-Capillary 103 (*) 70 - 99 mg/dL  HEPATITIS B CORE ANTIBODY, TOTAL      Component Value Range   Hep B Core Total Ab NEGATIVE  NEGATIVE  HEPATITIS B SURFACE ANTIBODY      Component Value Range   Hep B S Ab NEGATIVE  NEGATIVE  ALT      Component Value Range   ALT 125 (*) 0 - 53 U/L  HEPATITIS B SURFACE ANTIGEN      Component Value Range   Hepatitis B Surface Ag NEGATIVE  NEGATIVE  GLUCOSE, CAPILLARY      Component Value Range   Glucose-Capillary 152 (*) 70 - 99 mg/dL  CBC      Component Value Range   WBC 6.5  4.0 - 10.5 K/uL   RBC 3.85 (*) 4.22 - 5.81 MIL/uL   Hemoglobin 10.2 (*) 13.0 -  17.0 g/dL   HCT 59.5 (*) 63.8 - 75.6 %   MCV 79.7  78.0 - 100.0 fL   MCH 26.5  26.0 - 34.0 pg   MCHC 33.2  30.0 - 36.0 g/dL   RDW 43.3 (*) 29.5 - 18.8 %   Platelets 276  150 - 400 K/uL  HEPARIN LEVEL (UNFRACTIONATED)      Component Value Range   Heparin Unfractionated 0.22 (*) 0.30 - 0.70 IU/mL  PROTIME-INR      Component Value Range   Prothrombin Time 19.4 (*) 11.6 - 15.2 seconds   INR 1.70 (*) 0.00 - 1.49  GLUCOSE, CAPILLARY      Component Value Range   Glucose-Capillary 154 (*) 70 - 99 mg/dL  GLUCOSE, CAPILLARY      Component Value Range   Glucose-Capillary 143 (*) 70 - 99 mg/dL  GLUCOSE, CAPILLARY      Component Value Range   Glucose-Capillary 134 (*) 70 - 99 mg/dL  HEPARIN LEVEL (UNFRACTIONATED)      Component Value Range   Heparin Unfractionated 0.44  0.30 - 0.70 IU/mL  GLUCOSE, CAPILLARY      Component Value  Range   Glucose-Capillary 205 (*) 70 - 99 mg/dL   Comment 1 Notify RN    GLUCOSE, CAPILLARY      Component Value Range   Glucose-Capillary 151 (*) 70 - 99 mg/dL  GLUCOSE, CAPILLARY      Component Value Range   Glucose-Capillary 106 (*) 70 - 99 mg/dL   Comment 1 Notify RN    CBC      Component Value Range   WBC 6.3  4.0 - 10.5 K/uL   RBC 3.93 (*) 4.22 - 5.81 MIL/uL   Hemoglobin 10.5 (*) 13.0 - 17.0 g/dL   HCT 19.1 (*) 47.8 - 29.5 %   MCV 81.9  78.0 - 100.0 fL   MCH 26.7  26.0 - 34.0 pg   MCHC 32.6  30.0 - 36.0 g/dL   RDW 62.1 (*) 30.8 - 65.7 %   Platelets 314  150 - 400 K/uL  HEPARIN LEVEL (UNFRACTIONATED)      Component Value Range   Heparin Unfractionated 0.20 (*) 0.30 - 0.70 IU/mL  PROTIME-INR      Component Value Range   Prothrombin Time 19.3 (*) 11.6 - 15.2 seconds   INR 1.69 (*) 0.00 - 1.49  RENAL FUNCTION PANEL      Component Value Range   Sodium 143  135 - 145 mEq/L   Potassium 3.7  3.5 - 5.1 mEq/L   Chloride 104  96 - 112 mEq/L   CO2 24  19 - 32 mEq/L   Glucose, Bld 280 (*) 70 - 99 mg/dL   BUN 44 (*) 6 - 23 mg/dL   Creatinine, Ser 8.46  (*) 0.50 - 1.35 mg/dL   Calcium 8.8  8.4 - 96.2 mg/dL   Phosphorus 5.5 (*) 2.3 - 4.6 mg/dL   Albumin 2.2 (*) 3.5 - 5.2 g/dL   GFR calc non Af Amer 10 (*) >90 mL/min   GFR calc Af Amer 11 (*) >90 mL/min  HEPATIC FUNCTION PANEL      Component Value Range   Total Protein 7.0  6.0 - 8.3 g/dL   Albumin 2.2 (*) 3.5 - 5.2 g/dL   AST 44 (*) 0 - 37 U/L   ALT 85 (*) 0 - 53 U/L   Alkaline Phosphatase 120 (*) 39 - 117 U/L   Total Bilirubin 0.5  0.3 - 1.2 mg/dL   Bilirubin, Direct 0.3  0.0 - 0.3 mg/dL   Indirect Bilirubin 0.2 (*) 0.3 - 0.9 mg/dL  HEPARIN LEVEL (UNFRACTIONATED)      Component Value Range   Heparin Unfractionated 0.48  0.30 - 0.70 IU/mL  HEPARIN LEVEL (UNFRACTIONATED)      Component Value Range   Heparin Unfractionated 0.55  0.30 - 0.70 IU/mL  CBC      Component Value Range   WBC 8.1  4.0 - 10.5 K/uL   RBC 4.03 (*) 4.22 - 5.81 MIL/uL   Hemoglobin 10.5 (*) 13.0 - 17.0 g/dL   HCT 95.2 (*) 84.1 - 32.4 %   MCV 81.9  78.0 - 100.0 fL   MCH 26.1  26.0 - 34.0 pg   MCHC 31.8  30.0 - 36.0 g/dL   RDW 40.1 (*) 02.7 - 25.3 %   Platelets 401 (*) 150 - 400 K/uL  HEPARIN LEVEL (UNFRACTIONATED)      Component Value Range   Heparin Unfractionated 0.50  0.30 - 0.70 IU/mL  PROTIME-INR      Component Value Range   Prothrombin Time 20.3 (*) 11.6 - 15.2 seconds  INR 1.81 (*) 0.00 - 1.49  RENAL FUNCTION PANEL      Component Value Range   Sodium 143  135 - 145 mEq/L   Potassium 3.8  3.5 - 5.1 mEq/L   Chloride 106  96 - 112 mEq/L   CO2 23  19 - 32 mEq/L   Glucose, Bld 575 (*) 70 - 99 mg/dL   BUN 62 (*) 6 - 23 mg/dL   Creatinine, Ser 1.61 (*) 0.50 - 1.35 mg/dL   Calcium 8.9  8.4 - 09.6 mg/dL   Phosphorus 3.9  2.3 - 4.6 mg/dL   Albumin 2.3 (*) 3.5 - 5.2 g/dL   GFR calc non Af Amer 8 (*) >90 mL/min   GFR calc Af Amer 9 (*) >90 mL/min  GLUCOSE, CAPILLARY      Component Value Range   Glucose-Capillary 411 (*) 70 - 99 mg/dL   Comment 1 Notify RN    GLUCOSE, CAPILLARY      Component  Value Range   Glucose-Capillary 59 (*) 70 - 99 mg/dL  GLUCOSE, CAPILLARY      Component Value Range   Glucose-Capillary 101 (*) 70 - 99 mg/dL  CBC      Component Value Range   WBC 7.7  4.0 - 10.5 K/uL   RBC 3.87 (*) 4.22 - 5.81 MIL/uL   Hemoglobin 10.3 (*) 13.0 - 17.0 g/dL   HCT 04.5 (*) 40.9 - 81.1 %   MCV 82.9  78.0 - 100.0 fL   MCH 26.6  26.0 - 34.0 pg   MCHC 32.1  30.0 - 36.0 g/dL   RDW 91.4 (*) 78.2 - 95.6 %   Platelets 431 (*) 150 - 400 K/uL  HEPARIN LEVEL (UNFRACTIONATED)      Component Value Range   Heparin Unfractionated 0.47  0.30 - 0.70 IU/mL  PROTIME-INR      Component Value Range   Prothrombin Time 22.2 (*) 11.6 - 15.2 seconds   INR 2.04 (*) 0.00 - 1.49  GLUCOSE, CAPILLARY      Component Value Range   Glucose-Capillary 240 (*) 70 - 99 mg/dL  GLUCOSE, CAPILLARY      Component Value Range   Glucose-Capillary 280 (*) 70 - 99 mg/dL  GLUCOSE, CAPILLARY      Component Value Range   Glucose-Capillary 263 (*) 70 - 99 mg/dL  GLUCOSE, CAPILLARY      Component Value Range   Glucose-Capillary 299 (*) 70 - 99 mg/dL  GLUCOSE, CAPILLARY      Component Value Range   Glucose-Capillary 305 (*) 70 - 99 mg/dL  GLUCOSE, CAPILLARY      Component Value Range   Glucose-Capillary 184 (*) 70 - 99 mg/dL  CBC      Component Value Range   WBC 9.0  4.0 - 10.5 K/uL   RBC 3.90 (*) 4.22 - 5.81 MIL/uL   Hemoglobin 10.1 (*) 13.0 - 17.0 g/dL   HCT 21.3 (*) 08.6 - 57.8 %   MCV 83.1  78.0 - 100.0 fL   MCH 25.9 (*) 26.0 - 34.0 pg   MCHC 31.2  30.0 - 36.0 g/dL   RDW 46.9 (*) 62.9 - 52.8 %   Platelets 491 (*) 150 - 400 K/uL  HEPARIN LEVEL (UNFRACTIONATED)      Component Value Range   Heparin Unfractionated 0.41  0.30 - 0.70 IU/mL  PROTIME-INR      Component Value Range   Prothrombin Time 21.5 (*) 11.6 - 15.2 seconds   INR 1.95 (*) 0.00 -  1.49  RENAL FUNCTION PANEL      Component Value Range   Sodium 145  135 - 145 mEq/L   Potassium 3.5  3.5 - 5.1 mEq/L   Chloride 108  96 - 112  mEq/L   CO2 25  19 - 32 mEq/L   Glucose, Bld 275 (*) 70 - 99 mg/dL   BUN 49 (*) 6 - 23 mg/dL   Creatinine, Ser 1.61 (*) 0.50 - 1.35 mg/dL   Calcium 9.2  8.4 - 09.6 mg/dL   Phosphorus 2.7  2.3 - 4.6 mg/dL   Albumin 2.4 (*) 3.5 - 5.2 g/dL   GFR calc non Af Amer 10 (*) >90 mL/min   GFR calc Af Amer 11 (*) >90 mL/min  HEPATIC FUNCTION PANEL      Component Value Range   Total Protein 7.7  6.0 - 8.3 g/dL   Albumin 2.5 (*) 3.5 - 5.2 g/dL   AST 36  0 - 37 U/L   ALT 53  0 - 53 U/L   Alkaline Phosphatase 120 (*) 39 - 117 U/L   Total Bilirubin 0.4  0.3 - 1.2 mg/dL   Bilirubin, Direct 0.2  0.0 - 0.3 mg/dL   Indirect Bilirubin 0.2 (*) 0.3 - 0.9 mg/dL  GLUCOSE, CAPILLARY      Component Value Range   Glucose-Capillary 219 (*) 70 - 99 mg/dL  GLUCOSE, CAPILLARY      Component Value Range   Glucose-Capillary 216 (*) 70 - 99 mg/dL  GLUCOSE, CAPILLARY      Component Value Range   Glucose-Capillary 252 (*) 70 - 99 mg/dL  GLUCOSE, CAPILLARY      Component Value Range   Glucose-Capillary 244 (*) 70 - 99 mg/dL   Comment 1 Notify RN    GLUCOSE, CAPILLARY      Component Value Range   Glucose-Capillary 251 (*) 70 - 99 mg/dL   Comment 1 Notify RN    GLUCOSE, CAPILLARY      Component Value Range   Glucose-Capillary 195 (*) 70 - 99 mg/dL   Comment 1 Notify RN    CBC      Component Value Range   WBC 7.0  4.0 - 10.5 K/uL   RBC 3.50 (*) 4.22 - 5.81 MIL/uL   Hemoglobin 9.2 (*) 13.0 - 17.0 g/dL   HCT 04.5 (*) 40.9 - 81.1 %   MCV 85.1  78.0 - 100.0 fL   MCH 26.3  26.0 - 34.0 pg   MCHC 30.9  30.0 - 36.0 g/dL   RDW 91.4 (*) 78.2 - 95.6 %   Platelets 486 (*) 150 - 400 K/uL  HEPARIN LEVEL (UNFRACTIONATED)      Component Value Range   Heparin Unfractionated 0.50  0.30 - 0.70 IU/mL  PROTIME-INR      Component Value Range   Prothrombin Time 19.8 (*) 11.6 - 15.2 seconds   INR 1.75 (*) 0.00 - 1.49  RENAL FUNCTION PANEL      Component Value Range   Sodium 154 (*) 135 - 145 mEq/L   Potassium 3.9  3.5  - 5.1 mEq/L   Chloride 115 (*) 96 - 112 mEq/L   CO2 26  19 - 32 mEq/L   Glucose, Bld 255 (*) 70 - 99 mg/dL   BUN 61 (*) 6 - 23 mg/dL   Creatinine, Ser 2.13 (*) 0.50 - 1.35 mg/dL   Calcium 9.5  8.4 - 08.6 mg/dL   Phosphorus 2.7  2.3 - 4.6 mg/dL   Albumin  2.4 (*) 3.5 - 5.2 g/dL   GFR calc non Af Amer 9 (*) >90 mL/min   GFR calc Af Amer 10 (*) >90 mL/min  GLUCOSE, CAPILLARY      Component Value Range   Glucose-Capillary 277 (*) 70 - 99 mg/dL  GLUCOSE, CAPILLARY      Component Value Range   Glucose-Capillary 297 (*) 70 - 99 mg/dL  GLUCOSE, CAPILLARY      Component Value Range   Glucose-Capillary 266 (*) 70 - 99 mg/dL  GLUCOSE, CAPILLARY      Component Value Range   Glucose-Capillary 238 (*) 70 - 99 mg/dL  GLUCOSE, CAPILLARY      Component Value Range   Glucose-Capillary 252 (*) 70 - 99 mg/dL  GLUCOSE, CAPILLARY      Component Value Range   Glucose-Capillary 262 (*) 70 - 99 mg/dL   Comment 1 Notify RN    GLUCOSE, CAPILLARY      Component Value Range   Glucose-Capillary 230 (*) 70 - 99 mg/dL  CBC      Component Value Range   WBC 8.0  4.0 - 10.5 K/uL   RBC 3.86 (*) 4.22 - 5.81 MIL/uL   Hemoglobin 10.0 (*) 13.0 - 17.0 g/dL   HCT 40.9 (*) 81.1 - 91.4 %   MCV 83.9  78.0 - 100.0 fL   MCH 25.9 (*) 26.0 - 34.0 pg   MCHC 30.9  30.0 - 36.0 g/dL   RDW 78.2 (*) 95.6 - 21.3 %   Platelets 525 (*) 150 - 400 K/uL  HEPARIN LEVEL (UNFRACTIONATED)      Component Value Range   Heparin Unfractionated 0.58  0.30 - 0.70 IU/mL  PROTIME-INR      Component Value Range   Prothrombin Time 17.9 (*) 11.6 - 15.2 seconds   INR 1.52 (*) 0.00 - 1.49  RENAL FUNCTION PANEL      Component Value Range   Sodium 152 (*) 135 - 145 mEq/L   Potassium 4.1  3.5 - 5.1 mEq/L   Chloride 116 (*) 96 - 112 mEq/L   CO2 23  19 - 32 mEq/L   Glucose, Bld 278 (*) 70 - 99 mg/dL   BUN 70 (*) 6 - 23 mg/dL   Creatinine, Ser 0.86 (*) 0.50 - 1.35 mg/dL   Calcium 9.3  8.4 - 57.8 mg/dL   Phosphorus 3.0  2.3 - 4.6 mg/dL    Albumin 2.4 (*) 3.5 - 5.2 g/dL   GFR calc non Af Amer 9 (*) >90 mL/min   GFR calc Af Amer 10 (*) >90 mL/min  GLUCOSE, CAPILLARY      Component Value Range   Glucose-Capillary 168 (*) 70 - 99 mg/dL  GLUCOSE, CAPILLARY      Component Value Range   Glucose-Capillary 195 (*) 70 - 99 mg/dL  GLUCOSE, CAPILLARY      Component Value Range   Glucose-Capillary 247 (*) 70 - 99 mg/dL  GLUCOSE, CAPILLARY      Component Value Range   Glucose-Capillary 294 (*) 70 - 99 mg/dL  GLUCOSE, CAPILLARY      Component Value Range   Glucose-Capillary 292 (*) 70 - 99 mg/dL   A/P: Pt with hx encephalopathy, poor oral intake/protein calorie malnutrition and in need of percutaneous gastrostomy tube placement. Tent plans are to proceed with case on 12/2 if PT/INR within normal limits and VSS. IV heparin will need to be held for 4 hrs preprocedure.Family aware of plans.

## 2012-01-17 NOTE — Progress Notes (Signed)
ANTICOAGULATION CONSULT NOTE - Follow Up Consult  Pharmacy Consult for Heparin Indication: pulmonary embolus  Allergies  Allergen Reactions  . Metformin And Related    Labs:  Basename 01/17/12 0505 01/16/12 0425 01/15/12 0440  HGB 10.0* 9.2* --  HCT 32.4* 29.8* 32.4*  PLT 525* 486* 491*  APTT -- -- --  LABPROT 17.9* 19.8* 21.5*  INR 1.52* 1.75* 1.95*  HEPARINUNFRC 0.58 0.50 0.41  CREATININE 5.87* 5.77* 5.29*  CKTOTAL -- -- --  CKMB -- -- --  TROPONINI -- -- --   Estimated Creatinine Clearance: 12.1 ml/min (by C-G formula based on Cr of 5.87).  Assessment: Saddle PE:  Continuing Heparin bridging to Coumadin.  Heparin level is therapeutic at 0.58.    Coumadin on hold for feeding tube placement, INR down to 1.52 (needs to be less than 1.5)  CBC stable  Goal of Therapy:  INR 2-3 Heparin level 0.3-0.7 units/ml Monitor platelets by anticoagulation protocol: Yes   Plan:  Continue Heparin at 1400 units/hr - Follow up AM heparin level, CBC, protime    Thank you. Okey Regal, PharmD [phone] 01/17/2012, 8:35 AM

## 2012-01-17 NOTE — Progress Notes (Signed)
[facility] [place] TEAM 1 - Stepdown/ICU TEAM  [name] UEA:[id number] DOB: 1940/09/15 DOA: 01/04/2012 PCP: No primary provider on file.  HPI: Noah Torres is an 71 y.o. male who was admitted at [facility] due to SOB and was found to have a PE and DVT. While hospitalized patient developed complications of ARF, acidosis and shock requiring intubation. After intubation went into PEA arrest, was given TPA and required resuscitation for about 7 minutes before ROSC was obtained. Patient was transferred here due to renal deterioration and need for dialysis. Has had improvement in renal status and has been extubated but has not returned to baseline mental status. In work up, CT of the head was performed that shows evidence of small vessel ischemic changes and large ventricles suggestive of NPH.   Tests / Events:  11/11 - Saddle PE noted on CT-A, started anticoagulation  11/12 - Echo shows PA pressure 58-60; Doppler shows LLE DVT  11/13 - Develops shock started on pressors, develops resp failure intubated, cardiac arrest - given TPA and resuscitated  11/14 - AKI (Creat increa 1.5-->2.7)  11/16 - GRN in sputum, concern for BLL PNA, start on rocephin  11/17 - stopped all pressors  11/18 - transferred to [facility] and started on CRRT  11/20 - Multiple episodes of bradycardia/hypotension 2/2 precedex use, so med stopped  11/21 - Successfully extubated, no improvement in mental status  11/22 - CT shows no stroke, possible normal pressure hydrocephalus   Assessment/Plan:  LLE DVT w/ Saddle Pulmonary Embolus w/ cardio-respiratory collapse Required TPA at outside hospital - cont heparin->>Coumadin per pharmacy - At this point given that patient will require tube feeds given his neurological condition and inability to feed himself we will need to hold coumadin and as such will plan on continuing heparin with plans on holding heparin 4 hours prior to procedure (gtube placement)  Acute  renal failure / ATN on CKD (baseline crt 1.9) Ischemic ATN related to cardiac arrest - Nephrology is following - intermittently being dialyzed. Would like to thank Nephrology for their input and they raise an important point in that SNF most likely will not be able to facilitate intermittent dialysis.  As such I will place order for social work consult for placement into LTAC.  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  Discussed with son who agrees to proceed with feeding tube placement.  transaminitis / shock liver LFTs on last check 01/15/12 back at baseline.  Tachycardia  Due to PE - on metoprolol.  Patient with increased BUN/Creatinine ratio and chloride.  Will increase free water today and reassess.   HTN BP relatively well controlled with last blood pressure 126/75.  DM 2 Experienced some hypoglycemia 11/27 - now CBG elevated - Will increase Lantus from 26 units to 29 units. Blood sugars have fluctuated from 168-292.  Will continue to monitor and consider increasing lantus.  Since I recently increased lantus I would like to monitor for one more day prior to readjusting medication at this juncture.  S/P Cardiac arrest (PEA) at outside hospital  Severe protein calorie malnutrition Continue tube feeds - will make plans and have discussed case with Interventional radiology and son for peg tub placement.  Once INR < 1.5 patient will be able to have peg tub placed.  Reported GNR on sputum at  OSH, s/p Rocephin x 3 days Restart antibiotics if persistant fever  Code Status: full code Family Communication: spoke w/ son [name] at [phone] Disposition Plan: stable for transfer to tele bed - ultimate plan is for SNF placement DVT prophylaxis: Heparin infusion  Consultants:  Nephro  Neuro     Antibiotics:  Ceftriaxone 11/16>>>11/18  HPI/Subjective: The patient will open his eyes and turn to the examiner.  He will still not answer questions or follow simple commands.  There is no family present at the time of my evaluation.  Objective: Filed Vitals:   01/16/12 1643 01/16/12 2200 01/17/12 0445 01/17/12 1449  BP: 156/90 141/77 172/98 126/75  Pulse: 113 104 108 107  Temp:   99.1 F (37.3 C) 98.9 F (37.2 C)  TempSrc:   Axillary Axillary  Resp:  22 18 15   Height:      Weight:      SpO2:  92% 98% 100%    Intake/Output Summary (Last 24 hours) at 01/17/12 1500 Last data filed at 01/17/12 1451  Gross per 24 hour  Intake   1488 ml  Output   2050 ml  Net   -562 ml    Exam:  General: No acute respiratory distress at rest  Lungs: coarse breath sounds BL, no wheezes Cardiovascular: normal S1 and S2 no MRG Abdomen: Nontender, nondistended, soft, bowel sounds positive, no rebound, no ascites, no appreciable mass Extremities: No significant cyanosis, clubbing, or edema bilateral lower extremities Neuro: Patient gazes in examiners general direction but does not maintain gaze and seems to have non purposeful movements.  Data Reviewed: Basic Metabolic Panel:  Lab 01/17/12 0981 01/16/12 0425 01/15/12 0440 01/13/12 0455 01/12/12 0420  NA 152* 154* 145 143 143  K 4.1 3.9 3.5 3.8 3.7  CL 116* 115* 108 106 104  CO2 23 26 25 23 24   GLUCOSE 278* 255* 275* 575* 280*  BUN 70* 61* 49* 62* 44*  CREATININE 5.87* 5.77* 5.29* 6.34* 5.34*  CALCIUM 9.3 9.5 9.2 8.9 8.8  MG -- -- -- -- --  PHOS 3.0 2.7 2.7 3.9 5.5*   Liver Function Tests:  Lab 01/17/12 0505 01/16/12 0425 01/15/12 0440 01/13/12 0455 01/12/12 0425  AST -- -- 36 -- 44*  ALT -- -- 53 -- 85*  ALKPHOS -- -- 120* -- 120*  BILITOT -- -- 0.4 -- 0.5  PROT -- -- 7.7 -- 7.0  ALBUMIN 2.4* 2.4* 2.4*2.5* 2.3* 2.2*   CBC:  Lab 01/17/12 0505 01/16/12 0425 01/15/12 0440 01/14/12 0442 01/13/12 0455  WBC 8.0 7.0 9.0 7.7 8.1   NEUTROABS -- -- -- -- --  HGB 10.0* 9.2* 10.1* 10.3* 10.5*  HCT 32.4* 29.8* 32.4* 32.1* 33.0*  MCV 83.9 85.1 83.1 82.9 81.9  PLT 525* 486* 491* 431* 401*   CBG:  Lab 01/17/12 1137 01/17/12 0858 01/17/12 0413 01/17/12 0028 01/16/12 2013  GLUCAP 292* 294* 247* 195* 168*    No results found for this or any previous visit (from the past 240 hour(s)).   _______________________________________________________________________  Penny Pia  [facility] Office  [phone] Pager [phone]  On-Call/Text Page:      [email or web address]      password [email or web address]  01/17/2012, 3:00 PM  LOS: 13 days

## 2012-01-18 ENCOUNTER — Inpatient Hospital Stay (HOSPITAL_COMMUNITY): Payer: Medicare Other

## 2012-01-18 LAB — RENAL FUNCTION PANEL
Albumin: 2.4 g/dL — ABNORMAL LOW (ref 3.5–5.2)
BUN: 69 mg/dL — ABNORMAL HIGH (ref 6–23)
CO2: 21 mEq/L (ref 19–32)
Chloride: 116 mEq/L — ABNORMAL HIGH (ref 96–112)
Creatinine, Ser: 5.55 mg/dL — ABNORMAL HIGH (ref 0.50–1.35)
GFR calc non Af Amer: 9 mL/min — ABNORMAL LOW (ref >90)
Potassium: 3.9 mEq/L (ref 3.5–5.1)

## 2012-01-18 LAB — GLUCOSE, CAPILLARY
Glucose-Capillary: 140 mg/dL — ABNORMAL HIGH (ref 70–99)
Glucose-Capillary: 115 mg/dL — ABNORMAL HIGH (ref 70–99)
Glucose-Capillary: 181 mg/dL — ABNORMAL HIGH (ref 70–99)

## 2012-01-18 LAB — PROTIME-INR
INR: 1.37 (ref 0.00–1.49)
Prothrombin Time: 16.5 seconds — ABNORMAL HIGH (ref 11.6–15.2)

## 2012-01-18 LAB — CBC
HCT: 33.6 % — ABNORMAL LOW (ref 39.0–52.0)
Platelets: 511 10*3/uL — ABNORMAL HIGH (ref 150–400)
RBC: 3.99 MIL/uL — ABNORMAL LOW (ref 4.22–5.81)
RDW: 17.7 % — ABNORMAL HIGH (ref 11.5–15.5)
WBC: 7.9 10*3/uL (ref 4.0–10.5)

## 2012-01-18 LAB — APTT: aPTT: 147 seconds — ABNORMAL HIGH (ref 24–37)

## 2012-01-18 MED ORDER — IOHEXOL 300 MG/ML  SOLN
50.0000 mL | Freq: Once | INTRAMUSCULAR | Status: AC | PRN
  Administered 2012-01-18: 15 mL

## 2012-01-18 MED ORDER — HEPARIN (PORCINE) IN NACL 100-0.45 UNIT/ML-% IJ SOLN
1300.0000 [IU]/h | INTRAMUSCULAR | Status: DC
  Administered 2012-01-18 – 2012-01-19 (×2): 1350 [IU]/h via INTRAVENOUS
  Administered 2012-01-20: 1300 [IU]/h via INTRAVENOUS
  Administered 2012-01-20: 1400 [IU]/h via INTRAVENOUS
  Filled 2012-01-18 (×4): qty 250

## 2012-01-18 MED ORDER — MIDAZOLAM HCL 2 MG/2ML IJ SOLN
INTRAMUSCULAR | Status: AC | PRN
  Administered 2012-01-18: 1 mg via INTRAVENOUS

## 2012-01-18 MED ORDER — FENTANYL CITRATE 0.05 MG/ML IJ SOLN
INTRAMUSCULAR | Status: AC | PRN
  Administered 2012-01-18: 50 ug via INTRAVENOUS

## 2012-01-18 MED ORDER — GLUCAGON HCL (RDNA) 1 MG IJ SOLR
INTRAMUSCULAR | Status: AC
  Administered 2012-01-18: 0.5 mg
  Filled 2012-01-18: qty 1

## 2012-01-18 MED ORDER — DEXTROSE-NACL 5-0.2 % IV SOLN
INTRAVENOUS | Status: DC
  Administered 2012-01-18 – 2012-01-20 (×3): via INTRAVENOUS

## 2012-01-18 MED ORDER — FENTANYL CITRATE 0.05 MG/ML IJ SOLN
INTRAMUSCULAR | Status: AC
  Filled 2012-01-18: qty 4

## 2012-01-18 MED ORDER — MIDAZOLAM HCL 2 MG/2ML IJ SOLN
INTRAMUSCULAR | Status: AC
  Filled 2012-01-18: qty 4

## 2012-01-18 MED ORDER — WARFARIN - PHARMACIST DOSING INPATIENT: Freq: Every day | Status: DC

## 2012-01-18 MED ORDER — WARFARIN SODIUM 5 MG PO TABS
5.0000 mg | ORAL_TABLET | Freq: Once | ORAL | Status: AC
  Administered 2012-01-18: 5 mg via ORAL
  Filled 2012-01-18: qty 1

## 2012-01-18 NOTE — Progress Notes (Signed)
S:no purposeful response O:BP 172/84  Pulse 94  Temp 98.9 F (37.2 C) (Axillary)  Resp 18  Ht 5\' 10"  (1.778 m)  Wt 82.4 kg (181 lb 10.5 oz)  BMI 26.07 kg/m2  SpO2 99%  Intake/Output Summary (Last 24 hours) at 01/18/12 1102 Last data filed at 01/18/12 0500  Gross per 24 hour  Intake   1240 ml  Output   2125 ml  Net   -885 ml   Weight change:  ZOX:WRUEA and alert CVS:RRR Resp:clear Abd:+ BS NTND Ext:no edema NEURO:Moves all extremities, opens eyes but does not follow commands      . antiseptic oral rinse  15 mL Mouth Rinse QID  . carvedilol  12.5 mg Per Tube BID WC  .  ceFAZolin (ANCEF) IV  2 g Intravenous On Call  . chlorhexidine  15 mL Mouth Rinse BID  . free water  500 mL Per Tube Q4H  . insulin aspart  0-15 Units Subcutaneous Q4H  . insulin glargine  29 Units Subcutaneous Daily  . lactulose  20 g Per Tube BID  . pantoprazole sodium  40 mg Per Tube Daily  . [DISCONTINUED] free water  400 mL Per Tube Q4H  . [DISCONTINUED] lactated ringers  500 mL Intravenous Once   Dg Abd Portable 1v  01/18/2012  *RADIOLOGY REPORT*  Clinical Data: Evaluate barium prior to gastrostomy tube placement  PORTABLE ABDOMEN - 1 VIEW  Comparison: None.  Findings:  Barium is seen extending from the hepatic flexure to the rectum.  Nonobstructive bowel gas pattern.  Evaluation pneumoperitoneum is degraded secondary to supine positioning exclusion of the lower thorax.  Enteric tube tip and side port project over the left upper abdominal quadrant.  There is a very minimal amount of barium within the stomach.  No acute osseous abnormalities.  IMPRESSION: 1.  Barium extends from the hepatic flexure to the level of the rectum.  2.  Nonobstructive bowel gas pattern.   Original Report Authenticated By: Tacey Ruiz, MD    BMET    Component Value Date/Time   NA 151* 01/18/2012 0500   K 3.9 01/18/2012 0500   CL 116* 01/18/2012 0500   CO2 21 01/18/2012 0500   GLUCOSE 116* 01/18/2012 0500   BUN 69* 01/18/2012  0500   CREATININE 5.55* 01/18/2012 0500   CALCIUM 9.4 01/18/2012 0500   GFRNONAA 9* 01/18/2012 0500   GFRAA 11* 01/18/2012 0500   CBC    Component Value Date/Time   WBC 7.9 01/18/2012 0500   RBC 3.99* 01/18/2012 0500   HGB 10.4* 01/18/2012 0500   HCT 33.6* 01/18/2012 0500   PLT 511* 01/18/2012 0500   MCV 84.2 01/18/2012 0500   MCH 26.1 01/18/2012 0500   MCHC 31.0 01/18/2012 0500   RDW 17.7* 01/18/2012 0500     Assessment:  1. ARF, slightly improved with good UO 2. Saddle PE with arrrest 3. Hypernatremia  Plan: 1.  Start hypotonic fluids IV with D5 1/4NS 2. DC lactulose 3. Recheck labs in am.  Optimistic renal fx will recover     Haydon Kalmar T

## 2012-01-18 NOTE — Progress Notes (Addendum)
ANTICOAGULATION CONSULT NOTE - Follow Up Consult  Pharmacy Consult for Heparin Indication: pulmonary embolus  Allergies  Allergen Reactions  . Metformin And Related    Labs:  Basename 01/18/12 0500 01/17/12 0505 01/16/12 0425  HGB 10.4* 10.0* --  HCT 33.6* 32.4* 29.8*  PLT 511* 525* 486*  APTT 147* -- --  LABPROT 16.5* 17.9* 19.8*  INR 1.37 1.52* 1.75*  HEPARINUNFRC 0.69 0.58 0.50  CREATININE 5.55* 5.87* 5.77*  CKTOTAL -- -- --  CKMB -- -- --  TROPONINI -- -- --   Estimated Creatinine Clearance: 12.8 ml/min (by C-G formula based on Cr of 5.55).  Assessment:  71 YOM on heparin for saddle PE. Heparin level (0.69) high-end therapeutic this morning on 1400 units/hr. Hgb and Plt stable. Pt had G tube placement today, and heparin has been on hold 4 hrs prior to procedure. Talked with Dr. Lowella Dandy, will restart heparin this evening at 1700. Coumadin has been on hold since 11/29, INR decreased to 1.37 this morning, discussed with Dr. Cena Benton, will resume coumadin this evening as well.  Goal of Therapy:  INR 2-3 Heparin level 0.3-0.7 units/ml Monitor platelets by anticoagulation protocol: Yes   Plan:  Restart Heparin 1350 units/hr at 1700 F/u 8 hr heparin level at 0100 Coumadin 5mg  po x 1 F/u daily heparin level and PT INR    Bayard Hugger, PharmD, BCPS  Clinical Pharmacist  Pager: [phone]   01/18/2012, 3:17 PM

## 2012-01-18 NOTE — Care Management Note (Signed)
    Page 1 of 2   01/21/2012     3:50:21 PM   CARE MANAGEMENT NOTE 01/21/2012  Patient:  Noah Torres, Noah Torres   Account Number:  [id number]  Date Initiated:  01/05/2012  Documentation initiated by:  Avie Arenas  Subjective/Objective Assessment:   Tx from outside hospital post PE's, arrest, intubated with renal failure.     Action/Plan:   Anticipated DC Date:  01/21/2012   Anticipated DC Plan:  SKILLED NURSING FACILITY  In-house referral  Clinical Social Worker      DC Planning Services  CM consult      Choice offered to / List presented to:             Status of service:  Completed, signed off Medicare Important Message given?   (If response is "NO", the following Medicare IM given date fields will be blank) Date Medicare IM given:   Date Additional Medicare IM given:    Discharge Disposition:  SKILLED NURSING FACILITY  Per UR Regulation:  Reviewed for med. necessity/level of care/duration of stay  If discussed at Long Length of Stay Meetings, dates discussed:   01/19/2012  01/21/2012    Comments:  Contact:  Son Makei Mitrovich [phone]                 Step daughter Katrine Coho - [phone]  01/21/12 Naoma Boxell,RN,BSN [phone] NO INSURANCE APPROVAL FOR LTAC.  PT DISCHARGING TO SNF TODAY, PER CSW ARRANGEMENTS.  01/18/12 Antonique Langford,RN,BSN [phone] LTAC CONSULT ORDERED BY MD.  WILL ATTEMPT TO REACH FAMILY TO DISCUSS LTAC WITH FAMILY.  01/13/12 1130 Henrietta Mayo RN BSN MSN CCM Per CSW, no response from calls.  Cousin currently @ bedside, made several telephone calls to other family members in an unsuccesful attempt to determine how to reach dtr.  Provided staff RN with CSW telephone #, requested she call when other family members arrive.  01/12/12 1150 Henrietta Mayo RN BSN MSN CCM Met @ bedside with sister, Zenaida Deed [phone], [phone]) who states dtr Jaynie Crumble will be decision maker re pt's disposition.  Pt was living alone PTA as wife is  deceased.  Provided contact # for dtr, [phone] - provided #s to CSW, as pt will need placement.  01-08-12 12noon Avie Arenas, RNBSN [phone] Now extubated - awake but not following commands.  Step daughter, Jaynie Crumble in room - states lives at home alone. Discussed probable need for rehab prior to discharge. [name] agreed.  Discussed options - depending on his progression. Once able will need PT/OT ordered to assist. CM will continue to follow.  01-05-12 9:50am Avie Arenas, RNBSN [phone] On vent - CRRT - Opens eyes to voice.

## 2012-01-18 NOTE — Progress Notes (Signed)
PT Cancellation Note  Patient Details Name: Wyn Nettle MRN: [id number] DOB: 1940/05/13   Cancelled Treatment:    Reason Eval/Treat Not Completed: Patient at procedure or test/unavailable.  Pt out for PEG tube placement.    INGOLD,Chellsie Gomer 01/18/2012, 2:27 PM  [facility] [phone] (pager)

## 2012-01-18 NOTE — Procedures (Signed)
Successful placement of 20 Fr gastrostomy tube.  No immediate complication.  Plan to start using tomorrow.

## 2012-01-18 NOTE — Progress Notes (Signed)
[facility] [place] TEAM 1 - Stepdown/ICU TEAM  [name] ZOX:[id number] DOB: [birth year] DOA: 01/04/2012 PCP: No primary provider on file.  HPI: Noah Torres is an 71 y.o. male who was admitted at [facility] due to SOB and was found to have a PE and DVT. While hospitalized patient developed complications of ARF, acidosis and shock requiring intubation. After intubation went into PEA arrest, was given TPA and required resuscitation for about 7 minutes before ROSC was obtained. Patient was transferred here due to renal deterioration and need for dialysis. Has had improvement in renal status and has been extubated but has not returned to baseline mental status. In work up, CT of the head was performed that shows evidence of small vessel ischemic changes and large ventricles suggestive of NPH.   Tests / Events:  11/11 - Saddle PE noted on CT-A, started anticoagulation  11/12 - Echo shows PA pressure 58-60; Doppler shows LLE DVT  11/13 - Develops shock started on pressors, develops resp failure intubated, cardiac arrest - given TPA and resuscitated  11/14 - AKI (Creat increa 1.5-->2.7)  11/16 - GRN in sputum, concern for BLL PNA, start on rocephin  11/17 - stopped all pressors  11/18 - transferred to [facility] and started on CRRT  11/20 - Multiple episodes of bradycardia/hypotension 2/2 precedex use, so med stopped  11/21 - Successfully extubated, no improvement in mental status  11/22 - CT shows no stroke, possible normal pressure hydrocephalus   Assessment/Plan:  LLE DVT w/ Saddle Pulmonary Embolus w/ cardio-respiratory collapse Required TPA at outside hospital - cont heparin->>Coumadin per pharmacy - At this point given that patient will require tube feeds given his neurological condition and inability to feed himself we will need to hold coumadin until after g tube placement.  I have discussed this with pharmacy. Pharmacy to start coumadin per protocol.  Acute renal  failure / ATN on CKD (baseline crt 1.9) Ischemic ATN related to cardiac arrest - Nephrology is following - intermittently being dialyzed. Disposition pending renal function.  If improvement in renal function and no more needs for intermittent hemodialysis then patient can be considered for SNF.  But if patient's renal function does not improve and patient requires intermittent dialysis he may need LTAC.  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   -Discussed with son who agrees to proceed with feeding tube placement. Patient taken for G tube placement today 01/18/12 - Placed call to discuss neurological status and goals of care to son but could not reach patient on telephone.  transaminitis / shock liver - LFTs on last check 01/15/12 back at baseline. - d/c lactulose  Tachycardia  Due to PE - on metoprolol.  Patient with increased BUN/Creatinine ratio and chloride.   - improved after increasing free water 01/17/12. - Discussed with nephrology who also feels patient is on the dry side and as such will add IVF to current regimen.  HTN BP relatively well controlled with last blood pressure 140/88  DM 2 Experienced some hypoglycemia 11/27 - now CBG elevated - Will increase Lantus from 26 units to 29 units.  - Blood sugars better controlled 01/18/12.  Ranged from 107-181  S/P Cardiac arrest (PEA) at outside hospital  Severe protein calorie malnutrition Request dietician to help with tube feeds after peg tube placement - pt  is s/p peg tube placement 01/18/12  Reported GNR on sputum at OSH, s/p Rocephin x 3 days Patient afebrile with normal WBC level at this juncture will not start antibiotic regimen.  Code Status: full code Family Communication: spoke w/ son [name] at [phone] Disposition  Plan: stable for transfer to tele bed - ultimate plan is for SNF placement DVT prophylaxis: Heparin infusion  Consultants:  Nephro  Neuro   Antibiotics:  Ceftriaxone 11/16>>>11/18  HPI/Subjective: No acute issues overnight.  No family at bedside. Patient gazes in my general direction but unable to express himself.    Objective: Filed Vitals:   01/18/12 1335 01/18/12 1342 01/18/12 1345 01/18/12 1350  BP: 144/74 131/68 126/83 140/88  Pulse: 98 96 94 94  Temp:      TempSrc:      Resp: 12 13 18 20   Height:      Weight:      SpO2: 99% 99% 100% 99%    Intake/Output Summary (Last 24 hours) at 01/18/12 1630 Last data filed at 01/18/12 0500  Gross per 24 hour  Intake   1240 ml  Output   1200 ml  Net     40 ml    Exam:  General: No acute respiratory distress at rest  Lungs: coarse breath sounds BL, no wheezes Cardiovascular: normal S1 and S2 no MRG Abdomen: Nontender, nondistended, soft, bowel sounds positive, no rebound. Pt examined prior to procedure earlier today. Extremities: No significant cyanosis, clubbing, or edema bilateral lower extremities Neuro: Patient gazes in examiners general direction and moves all extremities.  Data Reviewed: Basic Metabolic Panel:  Lab 01/18/12 7253 01/17/12 0505 01/16/12 0425 01/15/12 0440 01/13/12 0455  NA 151* 152* 154* 145 143  K 3.9 4.1 3.9 3.5 3.8  CL 116* 116* 115* 108 106  CO2 21 23 26 25 23   GLUCOSE 116* 278* 255* 275* 575*  BUN 69* 70* 61* 49* 62*  CREATININE 5.55* 5.87* 5.77* 5.29* 6.34*  CALCIUM 9.4 9.3 9.5 9.2 8.9  MG -- -- -- -- --  PHOS 3.8 3.0 2.7 2.7 3.9   Liver Function Tests:  Lab 01/18/12 0500 01/17/12 0505 01/16/12 0425 01/15/12 0440 01/13/12 0455 01/12/12 0425  AST -- -- -- 36 -- 44*  ALT -- -- -- 53 -- 85*  ALKPHOS -- -- -- 120* -- 120*  BILITOT -- -- -- 0.4 -- 0.5  PROT -- -- -- 7.7 -- 7.0  ALBUMIN 2.4* 2.4* 2.4* 2.4*2.5* 2.3* --   CBC:  Lab 01/18/12 0500 01/17/12 0505 01/16/12 0425 01/15/12  0440 01/14/12 0442  WBC 7.9 8.0 7.0 9.0 7.7  NEUTROABS -- -- -- -- --  HGB 10.4* 10.0* 9.2* 10.1* 10.3*  HCT 33.6* 32.4* 29.8* 32.4* 32.1*  MCV 84.2 83.9 85.1 83.1 82.9  PLT 511* 525* 486* 491* 431*   CBG:  Lab 01/18/12 1110 01/18/12 0819 01/18/12 0617 01/18/12 0402 01/18/12 0217  GLUCAP 132* 125* 107* 115* 140*    No results found for this or any previous visit (from the past 240 hour(s)).   _______________________________________________________________________  Penny Pia  [facility] Office  [phone] Pager [phone]  On-Call/Text Page:      [email or web address]      password [facility]  01/18/2012, 4:30 PM  LOS: 14 days

## 2012-01-19 LAB — HEPARIN LEVEL (UNFRACTIONATED)
Heparin Unfractionated: 0.15 IU/mL — ABNORMAL LOW (ref 0.30–0.70)
Heparin Unfractionated: 0.59 IU/mL (ref 0.30–0.70)

## 2012-01-19 LAB — GLUCOSE, CAPILLARY
Glucose-Capillary: 120 mg/dL — ABNORMAL HIGH (ref 70–99)
Glucose-Capillary: 143 mg/dL — ABNORMAL HIGH (ref 70–99)
Glucose-Capillary: 135 mg/dL — ABNORMAL HIGH (ref 70–99)
Glucose-Capillary: 99 mg/dL (ref 70–99)

## 2012-01-19 LAB — CBC
MCH: 26.6 pg (ref 26.0–34.0)
MCHC: 31.7 g/dL (ref 30.0–36.0)
MCV: 84.2 fL (ref 78.0–100.0)
Platelets: 469 10*3/uL — ABNORMAL HIGH (ref 150–400)
RDW: 17.7 % — ABNORMAL HIGH (ref 11.5–15.5)
WBC: 6.7 10*3/uL (ref 4.0–10.5)

## 2012-01-19 LAB — RENAL FUNCTION PANEL
BUN: 66 mg/dL — ABNORMAL HIGH (ref 6–23)
CO2: 21 mEq/L (ref 19–32)
Calcium: 8.9 mg/dL (ref 8.4–10.5)
Creatinine, Ser: 5.06 mg/dL — ABNORMAL HIGH (ref 0.50–1.35)
Glucose, Bld: 102 mg/dL — ABNORMAL HIGH (ref 70–99)
Phosphorus: 4.4 mg/dL (ref 2.3–4.6)

## 2012-01-19 MED ORDER — WARFARIN SODIUM 5 MG PO TABS
5.0000 mg | ORAL_TABLET | Freq: Every day | ORAL | Status: DC
  Administered 2012-01-19 – 2012-01-20 (×2): 5 mg via ORAL
  Filled 2012-01-19 (×3): qty 1

## 2012-01-19 MED ORDER — OSMOLITE 1.5 CAL PO LIQD
1000.0000 mL | ORAL | Status: DC
  Filled 2012-01-19 (×2): qty 1000

## 2012-01-19 MED ORDER — OSMOLITE 1.2 CAL PO LIQD
1000.0000 mL | ORAL | Status: DC
  Administered 2012-01-19: 1000 mL
  Filled 2012-01-19 (×6): qty 1000

## 2012-01-19 NOTE — Progress Notes (Signed)
Speech Language Pathology Dysphagia Treatment Patient Details Name: Noah Torres MRN: [id number] DOB: 09/05/1940 Today's Date: 01/19/2012 Time: 0910-0940 SLP Time Calculation (min): 30 min  Assessment / Plan / Recommendation Clinical Impression  Pt now with PEG tube, continues to actively refuse PO trials. SLP utlized max contextual cues to encourage self feeding, HOH assist, placed ice cream and ice chips in oral cavity with toal assist, pt with no oral manipulation. Delayed cough as PO spilled to pharynx. Pt does exhibit involuntary swallow response with secretions. Pts dysphagia primarily cognitive based. Pt also observed to have white coating on lingual surface. Discussed concern for thrush with RN who will address. SLP will continue to follow.     Diet Recommendation  Continue with Current Diet: NPO    SLP Plan Continue with current plan of care   Pertinent Vitals/Pain NA   Swallowing Goals  SLP Swallowing Goals Goal #3: Pt will actively accept and transit a variety of PO trials with SLP to determine ability to initiate diet with moderate contextual cues.  Swallow Study Goal #3 - Progress: Progressing toward goal  General Temperature Spikes Noted: No Respiratory Status: Supplemental O2 delivered via (comment) Behavior/Cognition: Alert;Uncooperative;Doesn't follow directions Oral Cavity - Dentition: Edentulous Patient Positioning: Upright in bed  Oral Cavity - Oral Hygiene Patient is HIGH RISK - Oral Care Protocol followed (see row info): Yes   Dysphagia Treatment Treatment focused on: Upgraded PO texture trials;Facilitation of oral phase;Utilization of compensatory strategies Treatment Methods/Modalities: Skilled observation Patient observed directly with PO's: Yes Type of PO's observed: Dysphagia 1 (puree);Thin liquids Feeding: Total assist;Refused PO Liquids provided via: Cup;Teaspoon Oral Phase Signs & Symptoms: Anterior loss/spillage Pharyngeal Phase Signs &  Symptoms: Immediate cough Type of cueing: Verbal   GO     Providencia Hottenstein, Riley Nearing 01/19/2012, 10:24 AM

## 2012-01-19 NOTE — Progress Notes (Signed)
Nutrition Consult/Follow-up  Intervention:   Initiate Osmolite 1.2 formula at 15 ml/hr and increase by 10 ml every 4 hours to goal rate of 75 ml/hr to provide 2160 total kcals, 100 gm protein, 1476 ml of free water RD to follow for nutrition care plan  Assessment:   Patient s/p G-tube placement per IR 12/2.  Per RN, to re-initiate EN after 1500 today.  Previous EN regimen: Osmolite 1.5 formula at goal rate of 60 ml/hr providing 2160 kcal, 90 grams protein, 1098 ml free water.  Free water flushes at 500 ml every 4 hours.  Had been getting dialyzed PRN.  ARF slightly improving per Renal Service.  Diet Order:  NPO  Meds: Scheduled Meds:   . antiseptic oral rinse  15 mL Mouth Rinse QID  . carvedilol  12.5 mg Per Tube BID WC  . chlorhexidine  15 mL Mouth Rinse BID  . free water  500 mL Per Tube Q4H  . insulin aspart  0-15 Units Subcutaneous Q4H  . insulin glargine  29 Units Subcutaneous Daily  . pantoprazole sodium  40 mg Per Tube Daily  . [COMPLETED] warfarin  5 mg Oral ONCE-1800  . warfarin  5 mg Oral q1800  . Warfarin - Pharmacist Dosing Inpatient   Does not apply q1800   Continuous Infusions:   . dextrose 5 % and 0.2 % NaCl 100 mL/hr at 01/19/12 0903  . feeding supplement (OSMOLITE 1.5 CAL) 1,000 mL (01/17/12 0728)  . heparin 1,400 Units/hr (01/19/12 1100)  . [DISCONTINUED] heparin 1,400 Units/hr (01/17/12 1230)   PRN Meds:.sodium chloride, bisacodyl, hydrALAZINE, oxyCODONE   CMP     Component Value Date/Time   NA 148* 01/19/2012 0030   K 4.2 01/19/2012 0030   CL 113* 01/19/2012 0030   CO2 21 01/19/2012 0030   GLUCOSE 102* 01/19/2012 0030   BUN 66* 01/19/2012 0030   CREATININE 5.06* 01/19/2012 0030   CALCIUM 8.9 01/19/2012 0030   PROT 7.7 01/15/2012 0440   ALBUMIN 2.3* 01/19/2012 0030   AST 36 01/15/2012 0440   ALT 53 01/15/2012 0440   ALKPHOS 120* 01/15/2012 0440   BILITOT 0.4 01/15/2012 0440   GFRNONAA 10* 01/19/2012 0030   GFRAA 12* 01/19/2012 0030    Phosphorus  Date  Value Range Status  01/19/2012 4.4  2.3 - 4.6 mg/dL Final    CBG (last 3)   Basename 01/19/12 1120 01/19/12 0838 01/19/12 0355  GLUCAP 135* 143* 120*     Intake/Output Summary (Last 24 hours) at 01/19/12 1417 Last data filed at 01/18/12 1700  Gross per 24 hour  Intake      0 ml  Output   1400 ml  Net  -1400 ml    Weight Status:  82 kg (12/3) -- stable  Re-estimated needs:  2000-2200 kcals, 95-105 gm protein  Nutrition Dx:  Inadequate oral intake related to inability to eat as evidenced by NPO status, ongoing  Goal:  EN to meet >/= 90% of estimated nutrition needs, currently unmet  Monitor:  EN regimen & tolerance, weight, labs, I/O's  Kirkland Hun, RD, LDN Pager #: [phone] After-Hours Pager #: [phone]

## 2012-01-19 NOTE — Progress Notes (Signed)
Physical Therapy Treatment Patient Details Name: Noah Torres MRN: [id number] DOB: 08/05/1940 Today's Date: 01/19/2012 Time: 4098-1191 PT Time Calculation (min): 25 min  PT Assessment / Plan / Recommendation Comments on Treatment Session  71 yo adm with LLE DVT, saddle PE, developed acute resp failure, intubated, AMS on extubation with head CT showing enlarged ventricles/NPH. Pt more alert and consistently tracking therapist around bed. Noted pt wearing Lt hearing aid and attempted to speak loud enough for pt to understand, however pt remained non-communicative. No AROM of RLE noted. Continue trial of PT.    Follow Up Recommendations  SNF     Does the patient have the potential to tolerate intense rehabilitation     Barriers to Discharge        Equipment Recommendations  None recommended by PT    Recommendations for Other Services    Frequency Min 2X/week   Plan Discharge plan remains appropriate;Frequency needs to be updated    Precautions / Restrictions Precautions Precautions: Fall Precaution Comments: PEG   Pertinent Vitals/Pain No indications of pain    Mobility  Bed Mobility Bed Mobility: Rolling Right;Rolling Left;Scooting to [facility] Rolling Right: 1: +1 Total assist Rolling Left: 1: +1 Total assist Scooting to HOB: 1: +2 Total assist Scooting to [facility]: Patient Percentage: 0% Details for Bed Mobility Assistance: Facilitated flexion of bil legs, guided UE across chest, and facilitated neck flexion and rotation to cue pt for rolling with pt only assisting with head movements. Repeated rolling x 2 each side for repetition to incr chance of participation. Transfers Transfers: Not assessed Ambulation/Gait Ambulation/Gait Assistance: Not tested (comment)    Exercises Other Exercises Other Exercises: PROM to all 4 extremities with pt actively moving Lt ankle and bil elbows (into flexion), but not on command. ? actively assisting with reaching Lt hand to touch the top of  his head.   PT Diagnosis:    PT Problem List:   PT Treatment Interventions:     PT Goals Additional Goals Additional Goal #1: Tolerate UE/LE AAROM.  PT Goal: Additional Goal #1 - Progress: Progressing toward goal  Visit Information  Last PT Received On: 01/19/12 Assistance Needed: +2    Subjective Data  Subjective: Made one vocalization "aahh" when asked multiple questions. RN reports he said "hey" this morning   Cognition  Overall Cognitive Status: Impaired Area of Impairment: Following commands Difficult to assess due to: Impaired communication (non-verbal) Arousal/Alertness: Awake/alert Behavior During Session: Flat affect Following Commands: Other (comment) (? followig AAROM with LUE) Cognition - Other Comments: Pt turns head to name called; tracks therapist around bed. No attempts to nod/shake head to yes/no ?'s.    Balance     End of Session PT - End of Session Activity Tolerance: Patient tolerated treatment well Patient left: in bed;with call bell/phone within reach;with bed alarm set   GP     Elford Evilsizer 01/19/2012, 2:37 PM Pager [phone]

## 2012-01-19 NOTE — Progress Notes (Signed)
ANTICOAGULATION CONSULT NOTE - Follow Up Consult  Pharmacy Consult for Heparin, coumadin Indication: pulmonary embolus  Allergies  Allergen Reactions  . Metformin And Related    Labs:  Basename 01/19/12 1843 01/19/12 0823 01/19/12 0030 01/18/12 0500 01/17/12 0505  HGB -- -- 10.1* 10.4* --  HCT -- -- 31.9* 33.6* 32.4*  PLT -- -- 469* 511* 525*  APTT -- -- -- 147* --  LABPROT -- -- 16.8* 16.5* 17.9*  INR -- -- 1.40 1.37 1.52*  HEPARINUNFRC 0.59 0.28* 0.15* -- --  CREATININE -- -- 5.06* 5.55* 5.87*  CKTOTAL -- -- -- -- --  CKMB -- -- -- -- --  TROPONINI -- -- -- -- --   Estimated Creatinine Clearance: 14 ml/min (by C-G formula based on Cr of 5.06).  Assessment:  71 YO Male with PE, s/p PEG placement, for Heparin and coumadin.  Heparin slightly subtherapeutic 0.28 now 0.59 in goal range.  Goal of Therapy:  Heparin level 0.3-0.7 Monitor platelets by anticoagulation protocol: Yes   Plan:  Continue heparin 1400 units/hr Recheck level in am.  01/19/2012, 7:38 PM

## 2012-01-19 NOTE — Progress Notes (Signed)
ANTICOAGULATION CONSULT NOTE - Follow Up Consult  Pharmacy Consult for Heparin Indication: pulmonary embolus  Allergies  Allergen Reactions  . Metformin And Related    Labs:  Basename 01/19/12 0030 01/18/12 0500 01/17/12 0505 01/16/12 0425  HGB 10.1* 10.4* -- --  HCT 31.9* 33.6* 32.4* --  PLT 469* 511* 525* --  APTT -- 147* -- --  LABPROT 16.8* 16.5* 17.9* --  INR 1.40 1.37 1.52* --  HEPARINUNFRC 0.15* 0.69 0.58 --  CREATININE -- 5.55* 5.87* 5.77*  CKTOTAL -- -- -- --  CKMB -- -- -- --  TROPONINI -- -- -- --   Estimated Creatinine Clearance: 12.8 ml/min (by C-G formula based on Cr of 5.55).  Assessment:  71 YO Male with PE, s/p PEG placement, for Heparin  Goal of Therapy:  Heparin level 0.3-0.7 Monitor platelets by anticoagulation protocol: Yes   Plan:  Heparin level was previously therapeutic at current rate .  Expect level to increase with additional time (restarted yesterday evening s/p PEG placement).  No change to heparin rate for now.  Follow-up am labs.     Geannie Risen, PharmD, BCPS 01/19/2012, 1:03 AM

## 2012-01-19 NOTE — Progress Notes (Signed)
Subjective: Pt resting in bed, offers no c/o  Objective: Physical Exam: BP 155/81  Pulse 82  Temp 98.8 F (37.1 C) (Axillary)  Resp 20  Ht 5\' 10"  (1.778 m)  Wt 180 lb 12.4 oz (82 kg)  BMI 25.94 kg/m2  SpO2 100% LUQ g-tube intact, site clean. NT     Labs: CBC  Basename 01/19/12 0030 01/18/12 0500  WBC 6.7 7.9  HGB 10.1* 10.4*  HCT 31.9* 33.6*  PLT 469* 511*   BMET  Basename 01/19/12 0030 01/18/12 0500  NA 148* 151*  K 4.2 3.9  CL 113* 116*  CO2 21 21  GLUCOSE 102* 116*  BUN 66* 69*  CREATININE 5.06* 5.55*  CALCIUM 8.9 9.4   LFT  Basename 01/19/12 0030  PROT --  ALBUMIN 2.3*  AST --  ALT --  ALKPHOS --  BILITOT --  BILIDIR --  IBILI --  LIPASE --   PT/INR  Basename 01/19/12 0030 01/18/12 0500  LABPROT 16.8* 16.5*  INR 1.40 1.37     Studies/Results: Ir Gastrostomy Tube Mod Sed  01/18/2012  **ADDENDUM** CREATED: 01/18/2012 18:23:48  Ancef 2 grams IV was given for the procedure.  As antibiotic prophylaxis, Ancef was ordered pre-procedure and administered intravenously within one hour of incision.  **END ADDENDUM** SIGNED BY: Glean Hess, M.D.   01/18/2012  *RADIOLOGY REPORT*  Clinical history:Anoxic brain injury and malnutrition.  PROCEDURE(S): PERCUTANEOUS GASTROSTOMY TUBE WITH FLUOROSCOPIC GUIDANCE  Physician: Rachelle Hora. Lowella Dandy, MD  Medications:Versed 1.00mg , Fentanyl 50 mcg. A radiology nurse monitored the patient for moderate sedation.  Moderate sedation time:20 minutes  Fluoroscopy time: 2.6 minutes  Procedure:Informed consent was obtained for a percutaneous gastrostomy tube.  The patient was placed on the interventional table.  Fluoroscopy demonstrated oral contrast in the transverse colon.  An orogastric tube was placed with fluoroscopic guidance. The anterior abdomen was prepped and draped in sterile fashion. Maximal barrier sterile technique was utilized including caps, mask, sterile gowns, sterile gloves, sterile drape, hand hygiene and skin antiseptic.   Stomach was inflated with air through the orogastric tube.  The skin and subcutaneous tissues were anesthetized with 1% lidocaine.  A 17 gauge needle was directed into the distended stomach with fluoroscopic guidance.  A wire was advanced into the stomach and a T-tact was deployed.  A 9-French vascular sheath was placed and the orogastric tube was snared using a Gooseneck snare device.  The orogastric tube and snare were pulled out of the patient's mouth.  The snare device was connected to a 20-French gastrostomy tube.  The snare device and gastrostomy tube were pulled through the patient's mouth and out the anterior abdominal wall.  The gastrostomy tube was cut to an appropriate length.  Contrast injection through gastrostomy tube confirmed placement within the stomach.  Fluoroscopic images were obtained for documentation.  The gastrostomy tube was flushed with normal saline.  Findings:Gastrostomy tube within the stomach.  Complications: None  Impression:Successful fluoroscopic guided percutaneous gastrostomy tube placement.   Original Report Authenticated By: Richarda Overlie, M.D.    Dg Abd Portable 1v  01/18/2012  *RADIOLOGY REPORT*  Clinical Data: Evaluate barium prior to gastrostomy tube placement  PORTABLE ABDOMEN - 1 VIEW  Comparison: None.  Findings:  Barium is seen extending from the hepatic flexure to the rectum.  Nonobstructive bowel gas pattern.  Evaluation pneumoperitoneum is degraded secondary to supine positioning exclusion of the lower thorax.  Enteric tube tip and side port project over the left upper abdominal quadrant.  There is a very minimal  amount of barium within the stomach.  No acute osseous abnormalities.  IMPRESSION: 1.  Barium extends from the hepatic flexure to the level of the rectum.  2.  Nonobstructive bowel gas pattern.   Original Report Authenticated By: Tacey Ruiz, MD     Assessment/Plan: S/p Perc G-tube placement 12/2 OK to begin TF this afternoon, after 3pm. Call with  problems.   LOS: 15 days    Brayton El PA-C 01/19/2012 8:54 AM

## 2012-01-19 NOTE — Progress Notes (Signed)
S: ? Follow simple commands O:BP 155/81  Pulse 82  Temp 98.8 F (37.1 C) (Axillary)  Resp 20  Ht 5\' 10"  (1.778 m)  Wt 82 kg (180 lb 12.4 oz)  BMI 25.94 kg/m2  SpO2 100%  Intake/Output Summary (Last 24 hours) at 01/19/12 0903 Last data filed at 01/18/12 1700  Gross per 24 hour  Intake      0 ml  Output   1400 ml  Net  -1400 ml   Weight change:  WUJ:WJXBJ and alert CVS:RRR Resp:clear Abd:+ BS NTND Ext:no edema NEURO:Moves all extremities, opens eyes  Seems to folow some simple commands     . antiseptic oral rinse  15 mL Mouth Rinse QID  . carvedilol  12.5 mg Per Tube BID WC  . [COMPLETED]  ceFAZolin (ANCEF) IV  2 g Intravenous On Call  . chlorhexidine  15 mL Mouth Rinse BID  . [EXPIRED] fentaNYL      . free water  500 mL Per Tube Q4H  . [COMPLETED] glucagon      . insulin aspart  0-15 Units Subcutaneous Q4H  . insulin glargine  29 Units Subcutaneous Daily  . [EXPIRED] midazolam      . pantoprazole sodium  40 mg Per Tube Daily  . [COMPLETED] warfarin  5 mg Oral ONCE-1800  . Warfarin - Pharmacist Dosing Inpatient   Does not apply q1800  . [DISCONTINUED] lactulose  20 g Per Tube BID   Ir Gastrostomy Tube Mod Sed  01/18/2012  **ADDENDUM** CREATED: 01/18/2012 18:23:48  Ancef 2 grams IV was given for the procedure.  As antibiotic prophylaxis, Ancef was ordered pre-procedure and administered intravenously within one hour of incision.  **END ADDENDUM** SIGNED BY: Glean Hess, M.D.   01/18/2012  *RADIOLOGY REPORT*  Clinical history:Anoxic brain injury and malnutrition.  PROCEDURE(S): PERCUTANEOUS GASTROSTOMY TUBE WITH FLUOROSCOPIC GUIDANCE  Physician: Rachelle Hora. Lowella Dandy, MD  Medications:Versed 1.00mg , Fentanyl 50 mcg. A radiology nurse monitored the patient for moderate sedation.  Moderate sedation time:20 minutes  Fluoroscopy time: 2.6 minutes  Procedure:Informed consent was obtained for a percutaneous gastrostomy tube.  The patient was placed on the interventional table.  Fluoroscopy  demonstrated oral contrast in the transverse colon.  An orogastric tube was placed with fluoroscopic guidance. The anterior abdomen was prepped and draped in sterile fashion. Maximal barrier sterile technique was utilized including caps, mask, sterile gowns, sterile gloves, sterile drape, hand hygiene and skin antiseptic.  Stomach was inflated with air through the orogastric tube.  The skin and subcutaneous tissues were anesthetized with 1% lidocaine.  A 17 gauge needle was directed into the distended stomach with fluoroscopic guidance.  A wire was advanced into the stomach and a T-tact was deployed.  A 9-French vascular sheath was placed and the orogastric tube was snared using a Gooseneck snare device.  The orogastric tube and snare were pulled out of the patient's mouth.  The snare device was connected to a 20-French gastrostomy tube.  The snare device and gastrostomy tube were pulled through the patient's mouth and out the anterior abdominal wall.  The gastrostomy tube was cut to an appropriate length.  Contrast injection through gastrostomy tube confirmed placement within the stomach.  Fluoroscopic images were obtained for documentation.  The gastrostomy tube was flushed with normal saline.  Findings:Gastrostomy tube within the stomach.  Complications: None  Impression:Successful fluoroscopic guided percutaneous gastrostomy tube placement.   Original Report Authenticated By: Richarda Overlie, M.D.    Dg Abd Portable 1v  01/18/2012  *RADIOLOGY REPORT*  Clinical Data: Evaluate barium prior to gastrostomy tube placement  PORTABLE ABDOMEN - 1 VIEW  Comparison: None.  Findings:  Barium is seen extending from the hepatic flexure to the rectum.  Nonobstructive bowel gas pattern.  Evaluation pneumoperitoneum is degraded secondary to supine positioning exclusion of the lower thorax.  Enteric tube tip and side port project over the left upper abdominal quadrant.  There is a very minimal amount of barium within the stomach.   No acute osseous abnormalities.  IMPRESSION: 1.  Barium extends from the hepatic flexure to the level of the rectum.  2.  Nonobstructive bowel gas pattern.   Original Report Authenticated By: Tacey Ruiz, MD    BMET    Component Value Date/Time   NA 148* 01/19/2012 0030   K 4.2 01/19/2012 0030   CL 113* 01/19/2012 0030   CO2 21 01/19/2012 0030   GLUCOSE 102* 01/19/2012 0030   BUN 66* 01/19/2012 0030   CREATININE 5.06* 01/19/2012 0030   CALCIUM 8.9 01/19/2012 0030   GFRNONAA 10* 01/19/2012 0030   GFRAA 12* 01/19/2012 0030   CBC    Component Value Date/Time   WBC 6.7 01/19/2012 0030   RBC 3.79* 01/19/2012 0030   HGB 10.1* 01/19/2012 0030   HCT 31.9* 01/19/2012 0030   PLT 469* 01/19/2012 0030   MCV 84.2 01/19/2012 0030   MCH 26.6 01/19/2012 0030   MCHC 31.7 01/19/2012 0030   RDW 17.7* 01/19/2012 0030     Assessment:  1. ARF, slightly improved with good UO 2. Saddle PE with arrrest 3. Hypernatremia, improving  Plan: 1.  Cont with hypotonic fluids today 2. Daily BMET    Hasaan Radde T

## 2012-01-19 NOTE — Progress Notes (Signed)
ANTICOAGULATION CONSULT NOTE - Follow Up Consult  Pharmacy Consult for Heparin, coumadin Indication: pulmonary embolus  Allergies  Allergen Reactions  . Metformin And Related    Labs:  Basename 01/19/12 0823 01/19/12 0030 01/18/12 0500 01/17/12 0505  HGB -- 10.1* 10.4* --  HCT -- 31.9* 33.6* 32.4*  PLT -- 469* 511* 525*  APTT -- -- 147* --  LABPROT -- 16.8* 16.5* 17.9*  INR -- 1.40 1.37 1.52*  HEPARINUNFRC 0.28* 0.15* 0.69 --  CREATININE -- 5.06* 5.55* 5.87*  CKTOTAL -- -- -- --  CKMB -- -- -- --  TROPONINI -- -- -- --   Estimated Creatinine Clearance: 14 ml/min (by C-G formula based on Cr of 5.06).  Assessment:  71 YO Male with PE, s/p PEG placement, for Heparin and coumadin.  Heparin slightly subtherapeutic 0.28 units/ml.  INR today 1.4  Goal of Therapy:  Heparin level 0.3-0.7 Monitor platelets by anticoagulation protocol: Yes   Plan:  Increase heparin to 1400 units/hr Recheck level in 8 hours Coumadin 5 mg po daily Cont daily labs.     Talbert Cage, PharmD 01/19/2012, 10:41 AM

## 2012-01-19 NOTE — Progress Notes (Signed)
[facility] [place] TEAM 1 - Stepdown/ICU TEAM  [name] WUJ:[id number] DOB: [birth year] DOA: 01/04/2012 PCP: No primary provider on file.  HPI: Noah Torres is an 71 y.o. male who was admitted at [facility] due to SOB and was found to have a PE and DVT. While hospitalized patient developed complications of ARF, acidosis and shock requiring intubation. After intubation went into PEA arrest, was given TPA and required resuscitation for about 7 minutes before ROSC was obtained. Patient was transferred here due to renal deterioration and need for dialysis. Has had improvement in renal status and has been extubated but has not returned to baseline mental status. In work up, CT of the head was performed that shows evidence of small vessel ischemic changes and large ventricles suggestive of NPH. Patient's recovery has been slow but his liver enzymes normalized and his creatinine has been improving off of dialysis (patient was getting intermittent dialysis).  Given his improvement in creatinine Nephrology is optimistic that patient will not require dialysis and as such patient once ready medically can be transitioned to SNF.    Tests / Events:  11/11 - Saddle PE noted on CT-A, started anticoagulation  11/12 - Echo shows PA pressure 58-60; Doppler shows LLE DVT  11/13 - Develops shock started on pressors, develops resp failure intubated, cardiac arrest - given TPA and resuscitated  11/14 - AKI (Creat increa 1.5-->2.7)  11/16 - GRN in sputum, concern for BLL PNA, start on rocephin  11/17 - stopped all pressors  11/18 - transferred to [facility] and started on CRRT  11/20 - Multiple episodes of bradycardia/hypotension 2/2 precedex use, so med stopped  11/21 - Successfully extubated, no improvement in mental status  11/22 - CT shows no stroke, possible normal pressure hydrocephalus   Assessment/Plan:  LLE DVT w/ Saddle Pulmonary Embolus w/ cardio-respiratory collapse Required TPA at  outside hospital - cont heparin->>Coumadin per pharmacy - which was discontinued over the weekend to allow appropriate INR for G tube placement.  Has been restarted at this juncture and INR trending up from 1.37 to 1.40.  Acute renal failure / ATN on CKD (baseline crt 1.9) Ischemic ATN related to cardiac arrest - Nephrology is following - was intermittently being dialyzed but given recovery patient has not required dialysis. Temp cath removed.  Disposition pending renal function.  If improvement in renal function and no more needs for intermittent hemodialysis then patient can be considered for SNF (which is currently the most likely case.)  But if patient's renal function does not improve and patient requires intermittent dialysis he may need LTAC.  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   -Discussed with son who agrees to proceed with feeding tube placement. Patient taken for G tube placement 01/18/12 - Placed call to discuss neurological status and goals of care to son but could not reach patient on telephone.  transaminitis / shock liver - LFTs on last check 01/15/12 back at baseline. - d/c lactulose  Tachycardia  Due to PE - on metoprolol.  Patient with increased BUN/Creatinine ratio and chloride.   - improved after increasing free water 01/17/12. - Discussed with nephrology who also feels patient is on the dry side and as such will added IVF to current regimen.  I have discussed with dietician who will increase free water administration.  HTN Stable continue current regimen and adjust blood pressure medication pending blood pressures.   DM 2 Experienced some hypoglycemia 11/27 - now CBG elevated - Lantus increased  from 26 units to 29 units.  - Blood sugars better controlled 01/18/12.   Ranged from 76-143  S/P Cardiac arrest (PEA) at outside hospital  Severe protein calorie malnutrition Request dietician to help with tube feeds after peg tube placement - pt is s/p peg tube placement 01/18/12 - Dietician has reviewed case and made further recommendations.  Reported GNR on sputum at OSH, s/p Rocephin x 3 days Patient afebrile with normal WBC level at this juncture will not start antibiotic regimen.  Code Status: full code Family Communication: spoke w/ son [name] at [phone] Disposition Plan: stable for transfer to tele bed - ultimate plan is for SNF placement DVT prophylaxis: Heparin infusion  Consultants:  Nephro  Neuro   Antibiotics:  Ceftriaxone 11/16>>>11/18  HPI/Subjective: No acute issues overnight.  No family at bedside. Patient gazes in my general direction but unable to express himself.  Nonetheless explained to patient his current medical condition.  But when asked if patient understood he would not acknowledge my question.  Objective: Filed Vitals:   01/18/12 1350 01/18/12 2132 01/19/12 0623 01/19/12 1431  BP: 140/88 151/87 155/81 157/84  Pulse: 94 90 82 81  Temp:  98.8 F (37.1 C) 98.8 F (37.1 C) 98.6 F (37 C)  TempSrc:  Axillary Axillary Oral  Resp: 20 20 20 18   Height:      Weight:   82 kg (180 lb 12.4 oz)   SpO2: 99% 98% 100% 100%   No intake or output data in the 24 hours ending 01/19/12 1817  Exam:  General: No acute respiratory distress at rest  Lungs: coarse breath sounds BL, no wheezes Cardiovascular: normal S1 and S2 no MRG Abdomen: Nontender, nondistended, soft, bowel sounds positive, no rebound. G tube in place Extremities: No significant cyanosis, clubbing, or edema bilateral lower extremities Neuro: Patient gazes in examiners general direction and moves all extremities.  Data Reviewed: Basic Metabolic Panel:  Lab 01/19/12 0865 01/18/12 0500 01/17/12 0505 01/16/12 0425 01/15/12 0440  NA 148* 151* 152* 154* 145  K  4.2 3.9 4.1 3.9 3.5  CL 113* 116* 116* 115* 108  CO2 21 21 23 26 25   GLUCOSE 102* 116* 278* 255* 275*  BUN 66* 69* 70* 61* 49*  CREATININE 5.06* 5.55* 5.87* 5.77* 5.29*  CALCIUM 8.9 9.4 9.3 9.5 9.2  MG -- -- -- -- --  PHOS 4.4 3.8 3.0 2.7 2.7   Liver Function Tests:  Lab 01/19/12 0030 01/18/12 0500 01/17/12 0505 01/16/12 0425 01/15/12 0440  AST -- -- -- -- 36  ALT -- -- -- -- 53  ALKPHOS -- -- -- -- 120*  BILITOT -- -- -- -- 0.4  PROT -- -- -- -- 7.7  ALBUMIN 2.3* 2.4* 2.4* 2.4* 2.4*2.5*   CBC:  Lab 01/19/12 0030 01/18/12 0500 01/17/12 0505 01/16/12 0425 01/15/12 0440  WBC 6.7 7.9 8.0 7.0 9.0  NEUTROABS -- -- -- -- --  HGB 10.1* 10.4* 10.0* 9.2* 10.1*  HCT 31.9* 33.6* 32.4* 29.8* 32.4*  MCV 84.2 84.2 83.9 85.1 83.1  PLT 469* 511* 525* 486* 491*   CBG:  Lab 01/19/12 1634 01/19/12 1120 01/19/12 0838 01/19/12 0355 01/18/12 2353  GLUCAP 76 135* 143* 120* 95    No results found for this or any previous visit (from the past 240 hour(s)).   _______________________________________________________________________  Cena Benton,  [facility]  [facility] Office  [phone] Pager [phone]  On-Call/Text Page:      [email or web address]      password [facility]  01/19/2012, 6:17 PM  LOS: 15 days

## 2012-01-20 LAB — GLUCOSE, CAPILLARY
Glucose-Capillary: 112 mg/dL — ABNORMAL HIGH (ref 70–99)
Glucose-Capillary: 114 mg/dL — ABNORMAL HIGH (ref 70–99)
Glucose-Capillary: 107 mg/dL — ABNORMAL HIGH (ref 70–99)

## 2012-01-20 LAB — CBC
Hemoglobin: 9.5 g/dL — ABNORMAL LOW (ref 13.0–17.0)
MCH: 26 pg (ref 26.0–34.0)
MCV: 82.2 fL (ref 78.0–100.0)
Platelets: 437 10*3/uL — ABNORMAL HIGH (ref 150–400)
RBC: 3.66 MIL/uL — ABNORMAL LOW (ref 4.22–5.81)
WBC: 5.7 10*3/uL (ref 4.0–10.5)

## 2012-01-20 LAB — RENAL FUNCTION PANEL
Albumin: 2.1 g/dL — ABNORMAL LOW (ref 3.5–5.2)
Chloride: 107 mEq/L (ref 96–112)
GFR calc non Af Amer: 12 mL/min — ABNORMAL LOW (ref >90)
Potassium: 3.5 mEq/L (ref 3.5–5.1)

## 2012-01-20 LAB — HEPARIN LEVEL (UNFRACTIONATED): Heparin Unfractionated: 0.74 IU/mL — ABNORMAL HIGH (ref 0.30–0.70)

## 2012-01-20 LAB — PROTIME-INR: Prothrombin Time: 19.1 seconds — ABNORMAL HIGH (ref 11.6–15.2)

## 2012-01-20 MED ORDER — ENOXAPARIN SODIUM 100 MG/ML ~~LOC~~ SOLN
85.0000 mg | SUBCUTANEOUS | Status: DC
  Administered 2012-01-20: 85 mg via SUBCUTANEOUS
  Filled 2012-01-20 (×2): qty 1

## 2012-01-20 NOTE — Progress Notes (Signed)
S: ? Follow simple commands O:BP 168/94  Pulse 95  Temp 99 F (37.2 C) (Axillary)  Resp 18  Ht 5\' 10"  (1.778 m)  Wt 83.553 kg (184 lb 3.2 oz)  BMI 26.43 kg/m2  SpO2 99%  Intake/Output Summary (Last 24 hours) at 01/20/12 0824 Last data filed at 01/20/12 1610  Gross per 24 hour  Intake 3029.3 ml  Output   2400 ml  Net  629.3 ml   Weight change: 1.552 kg (3 lb 6.8 oz) RUE:AVWUJ and alert CVS:RRR Resp:clear Abd:+ BS NTND Ext:no edema NEURO:Moves all extremities, opens eyes  Seems to folow some simple commands     . antiseptic oral rinse  15 mL Mouth Rinse QID  . carvedilol  12.5 mg Per Tube BID WC  . chlorhexidine  15 mL Mouth Rinse BID  . free water  500 mL Per Tube Q4H  . insulin aspart  0-15 Units Subcutaneous Q4H  . insulin glargine  29 Units Subcutaneous Daily  . pantoprazole sodium  40 mg Per Tube Daily  . warfarin  5 mg Oral q1800  . Warfarin - Pharmacist Dosing Inpatient   Does not apply q1800   Ir Gastrostomy Tube Mod Sed  01/18/2012  **ADDENDUM** CREATED: 01/18/2012 18:23:48  Ancef 2 grams IV was given for the procedure.  As antibiotic prophylaxis, Ancef was ordered pre-procedure and administered intravenously within one hour of incision.  **END ADDENDUM** SIGNED BY: Glean Hess, M.D.   01/18/2012  *RADIOLOGY REPORT*  Clinical history:Anoxic brain injury and malnutrition.  PROCEDURE(S): PERCUTANEOUS GASTROSTOMY TUBE WITH FLUOROSCOPIC GUIDANCE  Physician: Rachelle Hora. Lowella Dandy, MD  Medications:Versed 1.00mg , Fentanyl 50 mcg. A radiology nurse monitored the patient for moderate sedation.  Moderate sedation time:20 minutes  Fluoroscopy time: 2.6 minutes  Procedure:Informed consent was obtained for a percutaneous gastrostomy tube.  The patient was placed on the interventional table.  Fluoroscopy demonstrated oral contrast in the transverse colon.  An orogastric tube was placed with fluoroscopic guidance. The anterior abdomen was prepped and draped in sterile fashion. Maximal barrier  sterile technique was utilized including caps, mask, sterile gowns, sterile gloves, sterile drape, hand hygiene and skin antiseptic.  Stomach was inflated with air through the orogastric tube.  The skin and subcutaneous tissues were anesthetized with 1% lidocaine.  A 17 gauge needle was directed into the distended stomach with fluoroscopic guidance.  A wire was advanced into the stomach and a Torres-tact was deployed.  A 9-French vascular sheath was placed and the orogastric tube was snared using a Gooseneck snare device.  The orogastric tube and snare were pulled out of the patient's mouth.  The snare device was connected to a 20-French gastrostomy tube.  The snare device and gastrostomy tube were pulled through the patient's mouth and out the anterior abdominal wall.  The gastrostomy tube was cut to an appropriate length.  Contrast injection through gastrostomy tube confirmed placement within the stomach.  Fluoroscopic images were obtained for documentation.  The gastrostomy tube was flushed with normal saline.  Findings:Gastrostomy tube within the stomach.  Complications: None  Impression:Successful fluoroscopic guided percutaneous gastrostomy tube placement.   Original Report Authenticated By: Richarda Overlie, M.D.    BMET    Component Value Date/Time   NA 139 01/20/2012 0535   K 3.5 01/20/2012 0535   CL 107 01/20/2012 0535   CO2 19 01/20/2012 0535   GLUCOSE 89 01/20/2012 0535   BUN 56* 01/20/2012 0535   CREATININE 4.51* 01/20/2012 0535   CALCIUM 8.6 01/20/2012 0535   GFRNONAA  12* 01/20/2012 0535   GFRAA 14* 01/20/2012 0535   CBC    Component Value Date/Time   WBC 5.7 01/20/2012 0535   RBC 3.66* 01/20/2012 0535   HGB 9.5* 01/20/2012 0535   HCT 30.1* 01/20/2012 0535   PLT 437* 01/20/2012 0535   MCV 82.2 01/20/2012 0535   MCH 26.0 01/20/2012 0535   MCHC 31.6 01/20/2012 0535   RDW 17.2* 01/20/2012 0535     Assessment:  1. ARF, slightly improved with good UO 2. Saddle PE with arrrest 3. Hypernatremia,  resolved  Plan: 1.  DC IV fluids.  Cont free water per PEG 2. Daily SCR    Noah Torres

## 2012-01-20 NOTE — Progress Notes (Addendum)
ANTICOAGULATION CONSULT NOTE - Follow Up Consult  Pharmacy Consult for Heparin, coumadin Indication: pulmonary embolus  Allergies  Allergen Reactions  . Metformin And Related    Labs:  Basename 01/20/12 0535 01/19/12 1843 01/19/12 0823 01/19/12 0030 01/18/12 0500  HGB 9.5* -- -- 10.1* --  HCT 30.1* -- -- 31.9* 33.6*  PLT 437* -- -- 469* 511*  APTT -- -- -- -- 147*  LABPROT 19.1* -- -- 16.8* 16.5*  INR 1.66* -- -- 1.40 1.37  HEPARINUNFRC 0.74* 0.59 0.28* -- --  CREATININE 4.51* -- -- 5.06* 5.55*  CKTOTAL -- -- -- -- --  CKMB -- -- -- -- --  TROPONINI -- -- -- -- --   Estimated Creatinine Clearance: 15.7 ml/min (by C-G formula based on Cr of 4.51).  Assessment:  71 YO Male with PE, s/p PEG placement, for Heparin and coumadin.  Heparin slightly supratherapeutic 0.74, INR 1.66  Goal of Therapy:  Heparin level 0.3-0.7 Monitor platelets by anticoagulation protocol: Yes   Plan:  Decrease heparin to 1300 units/hr Recheck level later today Cont coumadin 5 mg daily  Cont daily INR/PT  01/20/2012, 9:05 AM  Addum:  Change heparin to lovenox, 85 mg sq daily.

## 2012-01-20 NOTE — Clinical Social Work Psychosocial (Signed)
Clinical Social Work Department BRIEF PSYCHOSOCIAL ASSESSMENT 01/20/2012  Patient:  ORRIS, PERIN     Account Number:  [id number]     Admit date:  01/04/2012  Clinical Social Worker:  Thomasene Mohair  Date/Time:  01/20/2012 10:00 AM  Referred by:    Date Referred:  01/19/2012 Referred for  SNF Placement   Other Referral:   Interview type:  Family Other interview type:    PSYCHOSOCIAL DATA Living Status:  ALONE Admitted from facility:   Level of care:   Primary support name:  Marva Panda Primary support relationship to patient:  CHILD, ADULT Degree of support available:   adequate    CURRENT CONCERNS Current Concerns  Post-Acute Placement   Other Concerns:   Goals of care    SOCIAL WORK ASSESSMENT / PLAN CSW was referred to Pt to assist with dc planning. Pt lives alone, has 2 childeren, wife passed away 20 years ago. Pt became critically ill and is now unable to live independently without extensive rehab. Pt was being considered for LTAC but has medically stabilized to go to SNF. Pt's family is requesting SNF in [facility]. CSW sent out Pt's information and provided Pt's family with options. Pt's family will decide in the AM with expected dc tomorrow if MD agrees. Preauthorization for SNF was sent to [employer] as well. CSW will continue to follow up with family and insurance for possible dc on Thursday.   Assessment/plan status:  Psychosocial Support/Ongoing Assessment of Needs Other assessment/ plan:   Information/referral to community resources:   Medicaid for possible long-term skilled needs at home or in SNF.    PATIENT'S/FAMILY'S RESPONSE TO PLAN OF CARE: Pt is non-verbal with unknown cognitive status at this time. Pt's family is engaged in the dc process and working together to address short-term and long-term needs of patient.   Frederico Hamman, LCSW [phone]

## 2012-01-20 NOTE — Progress Notes (Signed)
[facility] Progress Note  Noah Torres WUJ:[id number] DOB: [birth year] DOA: 01/04/2012 PCP: No primary provider on file.  HPI: Noah Torres is an 71 y.o. male who was admitted at [facility] due to SOB and was found to have a PE and DVT. While hospitalized patient developed complications of ARF, acidosis and shock requiring intubation. After intubation went into PEA arrest, was given TPA and required resuscitation for about 7 minutes before ROSC was obtained. Patient was transferred here due to renal deterioration and need for dialysis. Has had improvement in renal status and has been extubated but has not returned to baseline mental status. In work up, CT of the head was performed that shows evidence of small vessel ischemic changes and large ventricles suggestive of NPH. Patient's recovery has been slow but his liver enzymes normalized and his creatinine has been improving off of dialysis (patient was getting intermittent dialysis).  Given his improvement in creatinine Nephrology is optimistic that patient will not require dialysis and as such patient once ready medically can be transitioned to SNF.    Tests / Events:  11/11 - Saddle PE noted on CT-A, started anticoagulation  11/12 - Echo shows PA pressure 58-60; Doppler shows LLE DVT  11/13 - Develops shock started on pressors, develops resp failure intubated, cardiac arrest - given TPA and resuscitated  11/14 - AKI (Creat increa 1.5-->2.7)  11/16 - GRN in sputum, concern for BLL PNA, start on rocephin  11/17 - stopped all pressors  11/18 - transferred to [facility] and started on CRRT  11/20 - Multiple episodes of bradycardia/hypotension 2/2 precedex use, so med stopped  11/21 - Successfully extubated, no improvement in mental status  11/22 - CT shows no stroke, possible normal pressure hydrocephalus   Assessment/Plan:  LLE DVT w/ Saddle Pulmonary Embolus w/ cardio-respiratory collapse Required TPA at outside hospital -  >>Coumadin per pharmacy - which was discontinued over the weekend to allow appropriate INR for G tube placement.  Has been restarted at this juncture and INR trending up to 1.66 today. Will ask pharmacy to dose lovenox instead of IV heparin for ease of administration at SNF.  Acute renal failure / ATN on CKD (baseline crt 1.9) Ischemic ATN related to cardiac arrest - Nephrology is following - was intermittently being dialyzed but given recovery patient has not required dialysis. Temp cath removed.  Cr continues to improve.  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   - Patient taken for G tube placement 01/18/12 - Placed call to discuss neurological status and goals of care to son but could not reach patient on telephone.  transaminitis / shock liver - LFTs on last check 01/15/12 back at baseline. - d/c lactulose  Tachycardia  Due to PE - on metoprolol.  Patient with increased BUN/Creatinine ratio and chloride.   - improved after increasing free water 01/17/12.  HTN Stable continue current regimen and adjust blood pressure medication pending blood pressures.   DM 2 Experienced some hypoglycemia 11/27 - now CBG elevated - Lantus increased  from 26 units to 29 units.  - Blood sugars better controlled 01/18/12.  Ranged from 76-143  S/P Cardiac arrest (PEA) at outside hospital  Severe protein calorie malnutrition Request dietician to help with tube feeds after peg tube placement - pt is s/p peg tube placement 01/18/12 -  Dietician has reviewed case and made further recommendations.  Reported GNR on sputum at OSH, s/p Rocephin x 3 days Patient afebrile with normal WBC level at this juncture will not start antibiotic regimen.  Urinary Retention Will be discharged to SNF with foley  catheter.  Code Status: full code Family Communication: spoke w/ son [name] at [phone] Disposition Plan: SNF, likely in am 01/21/12. DVT prophylaxis: Heparin infusion  Consultants:  Nephro  Neuro   Antibiotics:  Ceftriaxone 11/16>>>11/18  HPI/Subjective: No acute issues overnight.  No family at bedside. Patient gazes in my general direction but unable to express himself.  Nonetheless explained to patient his current medical condition.  But when asked if patient understood he would not acknowledge my question.  Objective: Filed Vitals:   01/19/12 1431 01/19/12 2005 01/20/12 0431 01/20/12 0953  BP: 157/84 159/82 168/94 122/98  Pulse: 81 83 95 101  Temp: 98.6 F (37 C) 99 F (37.2 C) 99 F (37.2 C)   TempSrc: Oral Oral Axillary   Resp: 18 18 18    Height:      Weight:   83.553 kg (184 lb 3.2 oz)   SpO2: 100% 99% 99%     Intake/Output Summary (Last 24 hours) at 01/20/12 1409 Last data filed at 01/20/12 1130  Gross per 24 hour  Intake 3629.3 ml  Output   3203 ml  Net  426.3 ml    Exam:  General: No acute respiratory distress at rest  Lungs: coarse breath sounds BL, no wheezes Cardiovascular: normal S1 and S2 no MRG Abdomen: Nontender, nondistended, soft, bowel sounds positive, no rebound. G tube in place Extremities: No significant cyanosis, clubbing, or edema bilateral lower extremities Neuro: Patient gazes in examiners general direction and moves all extremities.  Data Reviewed: Basic Metabolic Panel:  Lab 01/20/12 0981 01/19/12 0030 01/18/12 0500 01/17/12 0505 01/16/12 0425  NA 139 148* 151* 152* 154*  K 3.5 4.2 3.9 4.1 3.9  CL 107 113* 116* 116* 115*  CO2 19 21 21 23 26   GLUCOSE 89 102* 116* 278* 255*  BUN 56* 66* 69* 70* 61*  CREATININE 4.51* 5.06* 5.55* 5.87* 5.77*  CALCIUM 8.6 8.9 9.4 9.3 9.5  MG -- -- -- -- --  PHOS 4.0 4.4 3.8 3.0 2.7   Liver Function Tests:  Lab 01/20/12 0535 01/19/12 0030 01/18/12 0500 01/17/12 0505 01/16/12 0425 01/15/12  0440  AST -- -- -- -- -- 36  ALT -- -- -- -- -- 53  ALKPHOS -- -- -- -- -- 120*  BILITOT -- -- -- -- -- 0.4  PROT -- -- -- -- -- 7.7  ALBUMIN 2.1* 2.3* 2.4* 2.4* 2.4* --   CBC:  Lab 01/20/12 0535 01/19/12 0030 01/18/12 0500 01/17/12 0505 01/16/12 0425  WBC 5.7 6.7 7.9 8.0 7.0  NEUTROABS -- -- -- -- --  HGB 9.5* 10.1* 10.4* 10.0* 9.2*  HCT 30.1* 31.9* 33.6* 32.4* 29.8*  MCV 82.2 84.2 84.2 83.9 85.1  PLT 437* 469* 511* 525* 486*   CBG:  Lab 01/20/12 1128 01/20/12 0816 01/20/12 0428 01/19/12 2355 01/19/12 2003  GLUCAP 112* 95 85 97 99    No results found for this or any previous visit (from the past 240 hour(s)).   _______________________________________________________________________  Chaya Jan  [facility] Office  [phone] Pager [phone]  On-Call/Text Page:      [email or web address]      password [email or web address]  01/20/2012, 2:09 PM  LOS: 16 days

## 2012-01-21 DIAGNOSIS — R339 Retention of urine, unspecified: Secondary | ICD-10-CM

## 2012-01-21 DIAGNOSIS — E119 Type 2 diabetes mellitus without complications: Secondary | ICD-10-CM

## 2012-01-21 DIAGNOSIS — I10 Essential (primary) hypertension: Secondary | ICD-10-CM

## 2012-01-21 LAB — PROTIME-INR
INR: 1.49 (ref 0.00–1.49)
Prothrombin Time: 17.6 seconds — ABNORMAL HIGH (ref 11.6–15.2)

## 2012-01-21 LAB — GLUCOSE, CAPILLARY
Glucose-Capillary: 149 mg/dL — ABNORMAL HIGH (ref 70–99)
Glucose-Capillary: 206 mg/dL — ABNORMAL HIGH (ref 70–99)
Glucose-Capillary: 156 mg/dL — ABNORMAL HIGH (ref 70–99)

## 2012-01-21 LAB — CBC
MCV: 81.5 fL (ref 78.0–100.0)
Platelets: 468 10*3/uL — ABNORMAL HIGH (ref 150–400)
RBC: 3.72 MIL/uL — ABNORMAL LOW (ref 4.22–5.81)
WBC: 5.1 10*3/uL (ref 4.0–10.5)

## 2012-01-21 LAB — RENAL FUNCTION PANEL
BUN: 53 mg/dL — ABNORMAL HIGH (ref 6–23)
CO2: 18 mEq/L — ABNORMAL LOW (ref 19–32)
Glucose, Bld: 185 mg/dL — ABNORMAL HIGH (ref 70–99)
Phosphorus: 3.9 mg/dL (ref 2.3–4.6)
Potassium: 3.6 mEq/L (ref 3.5–5.1)

## 2012-01-21 MED ORDER — OSMOLITE 1.2 CAL PO LIQD: 1000.0000 mL | ORAL | Status: DC

## 2012-01-21 MED ORDER — INSULIN GLARGINE 100 UNIT/ML ~~LOC~~ SOLN: 29.0000 [IU] | Freq: Every day | SUBCUTANEOUS | Status: DC

## 2012-01-21 MED ORDER — WARFARIN SODIUM 7.5 MG PO TABS: 7.5000 mg | ORAL_TABLET | Freq: Once | ORAL | Status: DC

## 2012-01-21 MED ORDER — PANTOPRAZOLE SODIUM 40 MG PO PACK: 40.0000 mg | PACK | Freq: Every day | ORAL | Status: DC

## 2012-01-21 MED ORDER — ENOXAPARIN SODIUM 100 MG/ML ~~LOC~~ SOLN: 85.0000 mg | SUBCUTANEOUS | Status: DC

## 2012-01-21 MED ORDER — CARVEDILOL 12.5 MG PO TABS: 12.5000 mg | ORAL_TABLET | Freq: Two times a day (BID) | ORAL | Status: DC

## 2012-01-21 MED ORDER — WARFARIN SODIUM 7.5 MG PO TABS
7.5000 mg | ORAL_TABLET | Freq: Once | ORAL | Status: DC
  Filled 2012-01-21: qty 1

## 2012-01-21 MED ORDER — OXYCODONE HCL 5 MG PO TABS: 5.0000 mg | ORAL_TABLET | ORAL | Status: DC | PRN

## 2012-01-21 MED ORDER — FREE WATER: 500.0000 mL | Status: DC

## 2012-01-21 NOTE — Progress Notes (Signed)
ANTICOAGULATION CONSULT NOTE - Follow Up Consult  Pharmacy Consult for coumadin Indication: pulmonary embolus  Allergies  Allergen Reactions  . Metformin And Related    Labs:  Basename 01/21/12 0720 01/20/12 0535 01/19/12 1843 01/19/12 0823 01/19/12 0030  HGB 9.7* 9.5* -- -- --  HCT 30.3* 30.1* -- -- 31.9*  PLT 468* 437* -- -- 469*  APTT -- -- -- -- --  LABPROT 17.6* 19.1* -- -- 16.8*  INR 1.49 1.66* -- -- 1.40  HEPARINUNFRC -- 0.74* 0.59 0.28* --  CREATININE 4.16* 4.51* -- -- 5.06*  CKTOTAL -- -- -- -- --  CKMB -- -- -- -- --  TROPONINI -- -- -- -- --   Estimated Creatinine Clearance: 17.1 ml/min (by C-G formula based on Cr of 4.16).  Assessment:  71 YO Male with PE, s/p PEG placement, for coumadin with lovenox bridge.  INR 1.49 today Goal of Therapy:  INR 2-3 Monitor platelets by anticoagulation protocol: Yes   Plan:  Increase coumadin 7.5 mg today Cont daily INR/PT Recommend 7.5 mg daily as outpt with close f/u INR  Thanks for allowing pharmacy to be a part of this patient's care.  Talbert Cage, PharmD Clinical Pharmacist, [phone]  01/21/2012, 10:10 AM

## 2012-01-21 NOTE — Progress Notes (Signed)
Physical Therapy Treatment Patient Details Name: Noah Torres MRN: [id number] DOB: 04/20/1940 Today's Date: 01/21/2012 Time: 0865-7846 PT Time Calculation (min): 30 min  PT Assessment / Plan / Recommendation Comments on Treatment Session  Pt continues to respond to or interact with therapist only minimally.  Initially, pt showed little muscle activity, but started moving L side more and engaging trunk more after working on balance in sitting.  Have noted only trace movement in the R upper or lower extremities    Follow Up Recommendations  SNF     Does the patient have the potential to tolerate intense rehabilitation     Barriers to Discharge        Equipment Recommendations  None recommended by PT    Recommendations for Other Services    Frequency Min 2X/week   Plan Discharge plan remains appropriate;Frequency needs to be updated    Precautions / Restrictions Precautions Precautions: Fall Precaution Comments: PEG   Pertinent Vitals/Pain     Mobility  Bed Mobility Bed Mobility: Rolling Right;Rolling Left;Supine to Sit;Sitting - Scoot to [place] of Bed;Sit to Sidelying Right Rolling Right: 1: +1 Total assist Rolling Left: 1: +1 Total assist Supine to Sit: 1: +2 Total assist;HOB elevated Supine to Sit: Patient Percentage: 0% Sitting - Scoot to Edge of Bed: 1: +2 Total assist Sitting - Scoot to Edge of Bed: Patient Percentage: 0% Sit to Supine: Patient Percentage: 10% Sit to Sidelying Right: 1: +2 Total assist;HOB flat Sit to Sidelying Right: Patient Percentage: 10% Scooting to HOB: 1: +2 Total assist Scooting to [facility]: Patient Percentage: 0% Details for Bed Mobility Assistance: v/tc's for direction to task; assisted bil knee flexion and facilitated normal movement Transfers Transfers: Not assessed Ambulation/Gait Ambulation/Gait Assistance: Not tested (comment) Stairs: No Wheelchair Mobility Wheelchair Mobility: No    Exercises     PT Diagnosis:    PT Problem  List:   PT Treatment Interventions:     PT Goals Acute Rehab PT Goals PT Goal Formulation: With family Time For Goal Achievement: 01/27/12 Potential to Achieve Goals: Fair PT Goal: Supine/Side to Sit - Progress: Not met PT Goal: Sit at Edge Of Bed - Progress: Progressing toward goal PT Goal: Sit to Supine/Side - Progress: Not met Additional Goals Additional Goal #1: Tolerate UE/LE AAROM.  PT Goal: Additional Goal #1 - Progress: Progressing toward goal  Visit Information  Last PT Received On: 01/21/12 Assistance Needed: +2    Subjective Data  Subjective: "Oh boy!"   Cognition  Overall Cognitive Status: Impaired Area of Impairment: Following commands Difficult to assess due to: Impaired communication Arousal/Alertness: Awake/alert Orientation Level:  (unable to assess) Behavior During Session: Other (comment) (generally flat, but got one smile out of him) Following Commands:  (did not follow 1-step commands) Cognition - Other Comments: focuses to calling his name    Balance  Balance Balance Assessed: Yes Static Sitting Balance Static Sitting - Balance Support: Feet supported;Left upper extremity supported Static Sitting - Level of Assistance: 2: Max assist;Other (comment) (variable from mod to +2 pt =30%) Static Sitting - Comment/# of Minutes: ~20 minutes, working on upright sittting, w/shift, balance, inhibiting tendency to mildly push R, elliciting truncal reactions or any voluntary movement.  End of Session PT - End of Session Activity Tolerance: Patient tolerated treatment well Patient left: in bed;with call bell/phone within reach Nurse Communication: Mobility status   GP     April Colter, Eliseo Gum 01/21/2012, 2:06 PM  01/21/2012  Radcliffe Bing, PT [phone] (pager)

## 2012-01-21 NOTE — Progress Notes (Signed)
S:  Follow simple commands O:BP 143/56  Pulse 100  Temp 98.9 F (37.2 C) (Axillary)  Resp 19  Ht 5\' 10"  (1.778 m)  Wt 83.3 kg (183 lb 10.3 oz)  BMI 26.35 kg/m2  SpO2 100%  Intake/Output Summary (Last 24 hours) at 01/21/12 0852 Last data filed at 01/20/12 2143  Gross per 24 hour  Intake   1055 ml  Output   2703 ml  Net  -1648 ml   Weight change: -0.253 kg (-8.9 oz) ZOX:WRUEA and alert CVS:RRR Resp:clear Abd:+ BS NTND Ext:no edema NEURO:Moves all extremities, opens eyes  Seems to folow some simple commands     . antiseptic oral rinse  15 mL Mouth Rinse QID  . carvedilol  12.5 mg Per Tube BID WC  . chlorhexidine  15 mL Mouth Rinse BID  . enoxaparin (LOVENOX) injection  85 mg Subcutaneous Q24H  . free water  500 mL Per Tube Q4H  . insulin aspart  0-15 Units Subcutaneous Q4H  . insulin glargine  29 Units Subcutaneous Daily  . pantoprazole sodium  40 mg Per Tube Daily  . warfarin  5 mg Oral q1800  . Warfarin - Pharmacist Dosing Inpatient   Does not apply q1800   No results found. BMET    Component Value Date/Time   NA 139 01/21/2012 0720   K 3.6 01/21/2012 0720   CL 108 01/21/2012 0720   CO2 18* 01/21/2012 0720   GLUCOSE 185* 01/21/2012 0720   BUN 53* 01/21/2012 0720   CREATININE 4.16* 01/21/2012 0720   CALCIUM 8.5 01/21/2012 0720   GFRNONAA 13* 01/21/2012 0720   GFRAA 15* 01/21/2012 0720   CBC    Component Value Date/Time   WBC 5.1 01/21/2012 0720   RBC 3.72* 01/21/2012 0720   HGB 9.7* 01/21/2012 0720   HCT 30.3* 01/21/2012 0720   PLT 468* 01/21/2012 0720   MCV 81.5 01/21/2012 0720   MCH 26.1 01/21/2012 0720   MCHC 32.0 01/21/2012 0720   RDW 17.3* 01/21/2012 0720     Assessment:  1. ARF, Scr cont to improve with good UO 2. Saddle PE with arrrest 3. Hypernatremia, resolved  Plan: 1.  Cont to follow labs.  UO remains excellent and if SNa trends up again then may have to resume IV fluids    Mckinnon Glick T

## 2012-01-21 NOTE — Discharge Summary (Signed)
Physician Discharge Summary  Patient ID: Noah Torres MRN: [id number] DOB/AGE: 1940-10-21 71 y.o.  Admit date: 01/04/2012 Discharge date: 01/21/2012  Primary Care Physician:  No primary provider on file.   Discharge Diagnoses:    Principal Problem:  *Cardiac arrest Active Problems:  Acute respiratory failure  PE (pulmonary thromboembolism)  Acute renal failure  Encephalopathy  Normal pressure hydrocephalus  HTN (hypertension)  DM type 2 (diabetes mellitus, type 2)  Urinary retention      Medication List     As of 01/21/2012 10:51 AM    STOP taking these medications         BC HEADACHE POWDER PO      glimepiride 2 MG tablet   Commonly known as: AMARYL      TAKE these medications         carvedilol 12.5 MG tablet   Commonly known as: COREG   Place 1 tablet (12.5 mg total) into feeding tube 2 (two) times daily with a meal.      enoxaparin 100 MG/ML injection   Commonly known as: LOVENOX   Inject 0.85 mLs (85 mg total) into the skin daily.      feeding supplement (OSMOLITE 1.2 CAL) Liqd   Place 1,000 mLs into feeding tube continuous.      free water Soln   Place 500 mLs into feeding tube every 4 (four) hours.      insulin glargine 100 UNIT/ML injection   Commonly known as: LANTUS   Inject 29 Units into the skin daily.      oxyCODONE 5 MG immediate release tablet   Commonly known as: Oxy IR/ROXICODONE   Place 1 tablet (5 mg total) into feeding tube every 4 (four) hours as needed.      pantoprazole sodium 40 mg/20 mL Pack   Commonly known as: PROTONIX   Place 20 mLs (40 mg total) into feeding tube daily.      warfarin 7.5 MG tablet   Commonly known as: COUMADIN   Take 1 tablet (7.5 mg total) by mouth one time only at 6 PM.           Disposition and Follow-up:  Will be discharged to SNF today in stable condition. Has a PEG for feeding as well as a foley catheter as he has some urinary retention.  Consults:  Dr. Briant Cedar, renal, Dr. Vassie Loll,  CCM   Significant Diagnostic Studies:  No results found.  Brief H and P: For complete details please refer to admission H and P, but in brief patient is a 71 y.o. male who was admitted at [facility] hospital due to SOB and was found to have a PE and DVT. While hospitalized patient developed complications of ARF, acidosis and shock requiring intubation. After intubation went into PEA arrest, was given TPA and required resuscitation for about 7 minutes before ROSC was obtained. Patient was transferred here due to renal deterioration and need for dialysis. Has had improvement in renal status and has been extubated but has not returned to baseline mental status. Initially admitted to the [facility] service and later transferred to [facility] for further evaluation and management.     Hospital Course:  Principal Problem:  *Cardiac arrest Active Problems:  Acute respiratory failure  PE (pulmonary thromboembolism)  Acute renal failure  Encephalopathy  Normal pressure hydrocephalus  HTN (hypertension)  DM type 2 (diabetes mellitus, type 2)  Urinary retention   LLE DVT w/ Saddle Pulmonary Embolus w/ cardio-respiratory collapse  Required TPA at outside  hospital - >>Coumadin per pharmacy - which was discontinued over the weekend to allow appropriate INR for G tube placement. Has been restarted at this juncture and INR trending up to 1.49 on day of DC. Will need to remain on lovenoc until his INR has been therapeutic for at least 2 days.   Acute renal failure / ATN on CKD (baseline crt 1.9)  Ischemic ATN related to cardiac arrest - Nephrology is following - was intermittently being dialyzed but given recovery patient has not required dialysis. Temp cath removed.  Cr continues to improve to 4.16 on day of DC.  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  - Patient taken for G tube placement 01/18/12   transaminitis / shock liver  - LFTs  back at baseline.  - d/c lactulose   Tachycardia  Due to PE - on metoprolol. Patient with increased BUN/Creatinine ratio and chloride.  - improved after increasing free water 01/17/12.   HTN  Stable continue current regimen and adjust blood pressure medication pending blood pressures.   DM 2  -Fair control. -Continue to adjust insulin as needed.  S/P Cardiac arrest (PEA) at outside hospital  -Likely related to large PE with saddle embolus.  Severe protein calorie malnutrition  Request dietician to help with tube feeds after peg tube placement  - pt is s/p peg tube placement 01/18/12  - Dietician has reviewed case and made further recommendations.   Reported GNR on sputum at OSH, s/p Rocephin x 3 days  Patient afebrile with normal WBC level at this juncture will not start antibiotic regimen.   Urinary Retention  Will be discharged to SNF with foley catheter where voiding trials will need to be attempted.    Time spent on Discharge: Greater than 30 minutes.  SignedChaya Jan [facility] Pager: [phone] 01/21/2012, 10:51 AM

## 2012-01-21 NOTE — Progress Notes (Signed)
Pt left partial plate here after discharge to SNF.  I called Ramon Dredge (sister) who stated that she lived in [place] and asked that I call Fenton Candee (son) at [phone].  Judithann Sheen says he will contact his sister Katrine Coho to pick up on [address].  Pt denture cup placed in bio bag and placed at front desk for pick up.  Will ask that MT/NS pass along information. Thomas Hoff

## 2012-01-21 NOTE — Progress Notes (Signed)
SLP Cancellation Note  Patient Details Name: Noah Torres MRN: [id number] DOB: 09-03-1940   Cancelled treatment:       Reason Eval/Treat Not Completed: Other (comment) (patient getting ready to d/c to SNF per RN. )  WIll defer further treatment to SLP at [facility]    Noah Torres 01/21/2012, 2:26 PM

## 2012-03-23 ENCOUNTER — Other Ambulatory Visit (HOSPITAL_COMMUNITY): Payer: Self-pay | Admitting: Internal Medicine

## 2012-03-23 DIAGNOSIS — R1310 Dysphagia, unspecified: Secondary | ICD-10-CM

## 2012-03-25 ENCOUNTER — Ambulatory Visit (HOSPITAL_COMMUNITY)
Admission: RE | Admit: 2012-03-25 | Discharge: 2012-03-25 | Disposition: A | Discharge status: Home / Self Care | Payer: Medicare Other | Source: Ambulatory Visit | Attending: Internal Medicine | Admitting: Internal Medicine

## 2012-03-25 DIAGNOSIS — R1310 Dysphagia, unspecified: Secondary | ICD-10-CM | POA: Insufficient documentation

## 2012-03-25 DIAGNOSIS — R1313 Dysphagia, pharyngeal phase: Secondary | ICD-10-CM | POA: Insufficient documentation

## 2012-03-25 NOTE — Procedures (Signed)
Objective Swallowing Evaluation: Modified Barium Swallowing Study  Patient Details  Name: Noah Torres MRN: [id number] Date of Birth: [birth year]  Today's Date: 03/25/2012 Time: 1220-1310 SLP Time Calculation (min): 50 min  Past Medical History:  Past Medical History  Diagnosis Date  . Diabetes mellitus, type 2   . Hypercholesterolemia   . Former smoker   . Bilateral hearing loss     Wears hearing aids  . Chronic kidney disease   . Hearing loss     uses hearing aids   Past Surgical History:  Past Surgical History  Procedure Date  . Spine surgery    HPI:  Noah Torres is a 71-yr. old male with h/o of respiratory failure, CHF, diabetes, hearing loss, and PEG placement in December of 2013. He verbally reported he has been NPO since the PEG placement and was referred to [facility] for MBS by his current primary care physician for a possible diet upgrade today, 03/25/12.     Assessment / Plan / Recommendation Clinical Impression  Dysphagia Diagnosis: Mild oral phase dysphagia;Mild pharyngeal phase dysphagia Clinical impression: Patient presents with a mild oropharyngeal dysphagia. During trials of puree consistency, patient exhibited a delay in swallow intitation with the swallow triggering at the level of the valleculae and at the level of the pyriform sinsuses on separate trials.  Delay in swallow initiation due to increased lingual pumping and manipulation of the bolus because of uncoordinated lingual movement. On both thin and nectar-thick liquids patient presented with a delay in swallow intiation, at times to the vallecula and at times reaching the pyriform sinuses, due to decreased oral control. Additionally, patient with trace amounts of oral and vallecular residue,  however patient spontaneously cleared all residue with second swallow.  Patient reports he has dentures.  Although he was not wearing them today, he attempted a trial of mechanical soft solid in which he exhibited increased  lingual pumping and poor bolus manipulation with more than 50% of the bolus spilling to the level of the pyriform sinsues before the swallow initiation.  Patient was able to clear with consecutive swallows, however he had to spit remaining oral residue out and stated that it was too hard to masticate. Patient demonstrated appropritate airway protection during all po trials, however with decreased lingual coordination and mastication difficulties, SLP recommends patient upgrade to dys-1 (puree) diet with thin liquids via cup and straw use with supervision due to large bolus size increasing risk of aspiration.    Treatment Recommendation  Defer treatment plan to SLP at (Comment) (SLP)    Diet Recommendation Dysphagia 1 (Puree);Thin liquid   Liquid Administration via: Cup;Straw (Straw with supervision) Medication Administration: Crushed with puree Supervision: Patient able to self feed;Intermittent supervision to cue for compensatory strategies Compensations: Slow rate;Small sips/bites Postural Changes and/or Swallow Maneuvers: Seated upright 90 degrees;Upright 30-60 min after meal    Other  Recommendations Oral Care Recommendations: Oral care BID   Follow Up Recommendations  Skilled Nursing facility    Frequency and Duration        Pertinent Vitals/Pain None reported    SLP Swallow Goals     General Date of Onset: 01/18/12 HPI: Noah Torres is a 71-yr. old male with h/o of respiratory failure, CHF, diabetes, hearing loss, and PEG placement in December of 2013. He verbally reported he has been NPO since the PEG placement and was referred to [facility] for MBS by his current primary care physician for a possible diet upgrade today, 03/25/12. Type of Study: Modified Barium  Swallowing Study Reason for Referral: Objectively evaluate swallowing function Previous Swallow Assessment: none Diet Prior to this Study: NPO;PEG tube Temperature Spikes Noted: N/A Respiratory Status: Room air History of  Recent Intubation: No Behavior/Cognition: Alert;Cooperative;Pleasant mood;Hard of hearing Oral Cavity - Dentition: Edentulous (dentures not worn, one natural tooth on bottom) Oral Motor / Sensory Function: Impaired motor (decreased tongue coordination) Self-Feeding Abilities: Able to feed self Patient Positioning: Upright in chair Baseline Vocal Quality: Wet;Low vocal intensity Volitional Cough: Strong Volitional Swallow: Able to elicit Anatomy: Within functional limits Pharyngeal Secretions: Not observed secondary MBS    Reason for Referral Objectively evaluate swallowing function   Oral Phase Oral Preparation/Oral Phase Oral Phase: Impaired Oral - Nectar Oral - Nectar Teaspoon: Lingual pumping;Lingual/palatal residue;Delayed oral transit Oral - Nectar Cup: Lingual pumping;Lingual/palatal residue;Delayed oral transit Oral - Thin Oral - Thin Teaspoon: Lingual/palatal residue;Lingual pumping;Delayed oral transit Oral - Thin Cup: Lingual/palatal residue;Delayed oral transit Oral - Thin Straw: Delayed oral transit;Lingual pumping Oral - Solids Oral - Puree: Lingual pumping;Lingual/palatal residue;Delayed oral transit Oral - Mechanical Soft: Impaired mastication;Lingual pumping;Delayed oral transit;Lingual/palatal residue (edentulous contributed to difficulty w/mastication) Oral - Pill: Weak lingual manipulation (unable to manipulate- patient spit pill out)   Pharyngeal Phase Pharyngeal Phase Pharyngeal Phase: Impaired Pharyngeal - Nectar Pharyngeal - Nectar Teaspoon: Premature spillage to valleculae;Delayed swallow initiation;Pharyngeal residue - valleculae (trace amounts of vallecular residue) Pharyngeal - Nectar Cup: Premature spillage to valleculae;Premature spillage to pyriform sinuses;Delayed swallow initiation;Pharyngeal residue - valleculae (trace vallecular residue) Pharyngeal - Thin Pharyngeal - Thin Teaspoon: Within functional limits Pharyngeal - Thin Cup: Pharyngeal residue  - valleculae;Pharyngeal residue - pyriform sinuses;Premature spillage to valleculae;Delayed swallow initiation Pharyngeal - Thin Straw: Premature spillage to valleculae;Premature spillage to pyriform sinuses Pharyngeal - Solids Pharyngeal - Puree: Delayed swallow initiation;Premature spillage to pyriform sinuses;Premature spillage to valleculae Pharyngeal - Mechanical Soft: Delayed swallow initiation;Premature spillage to pyriform sinuses;Premature spillage to valleculae  Cervical Esophageal Phase    GO    Cervical Esophageal Phase Cervical Esophageal Phase:  (on one trial) Cervical Esophageal Phase - Nectar Nectar Cup: Esophageal backflow into the pharynx    Functional Limitations: Swallowing Swallow Current Status (B1478): At least 1 percent but less than 20 percent impaired, limited or restricted Swallow Goal Status [phone]): At least 1 percent but less than 20 percent impaired, limited or restricted Swallow Discharge Status [phone]): At least 1 percent but less than 20 percent impaired, limited or restricted   Berdine Dance SLP student Berdine Dance 03/25/2012, 3:04 PM

## 2012-04-19 NOTE — Procedures (Signed)
SLP reviewed and agree with student findings.   Ashvik Grundman MA, CCC-SLP [phone]    

## 2012-05-09 ENCOUNTER — Non-Acute Institutional Stay (SKILLED_NURSING_FACILITY): Payer: Medicare Other | Admitting: Adult Health

## 2012-05-09 ENCOUNTER — Encounter: Payer: Self-pay | Admitting: Adult Health

## 2012-05-09 DIAGNOSIS — I2699 Other pulmonary embolism without acute cor pulmonale: Secondary | ICD-10-CM

## 2012-05-09 DIAGNOSIS — Z7901 Long term (current) use of anticoagulants: Secondary | ICD-10-CM | POA: Insufficient documentation

## 2012-05-09 DIAGNOSIS — I2782 Chronic pulmonary embolism: Secondary | ICD-10-CM

## 2012-05-09 NOTE — Progress Notes (Signed)
Subjective:     Patient ID: Noah Torres, male   DOB: Jun 22, 1940, 72 y.o.   MRN: [id number] Chief Complaint  Patient presents with  . Anticoagulation  . Medical Managment of Chronic Issues    HPI PE (pulmonary thromboembolism) Sis is presently stable no signs of further clotting issues present is on long term coumadin therapy   Long term (current) use of anticoagulants His inr is 3.3 and she is taking coumadin 8 mg daily    Past Medical History  Diagnosis Date  . Diabetes mellitus, type 2   . Hypercholesterolemia   . Former smoker   . Bilateral hearing loss     Wears hearing aids  . Chronic kidney disease   . Hearing loss     uses hearing aids   Past Surgical History  Procedure Laterality Date  . Spine surgery     Current outpatient prescriptions:carvedilol (COREG) 12.5 MG tablet, Place 1 tablet (12.5 mg total) into feeding tube 2 (two) times daily with a meal., Disp: 60 tablet, Rfl: 1;  insulin aspart (NOVOLOG) 100 UNIT/ML injection, Inject 5 Units into the skin 3 (three) times daily before meals. Give 5 units prior to meals for cbg >=150, Disp: , Rfl: ;  insulin glargine (LANTUS) 100 UNIT/ML injection, Inject 34 Units into the skin daily., Disp: , Rfl:  LORazepam (ATIVAN) 0.5 MG tablet, Take 0.25 mg by mouth every 6 (six) hours as needed for anxiety., Disp: , Rfl: ;  Nutritional Supplements (FEEDING SUPPLEMENT, OSMOLITE 1.2 CAL,) LIQD, Place 1,000 mLs into feeding tube continuous., Disp: , Rfl: ;  pantoprazole sodium (PROTONIX) 40 mg/20 mL PACK, Place 20 mLs (40 mg total) into feeding tube daily., Disp: 30 each, Rfl:  warfarin (COUMADIN) 7.5 MG tablet, Take 9 mg by mouth one time only at 6 PM., Disp: , Rfl: ;  enoxaparin (LOVENOX) 100 MG/ML injection, Inject 0.85 mLs (85 mg total) into the skin daily., Disp: 0 Syringe, Rfl: ;  oxyCODONE (OXY IR/ROXICODONE) 5 MG immediate release tablet, Place 1 tablet (5 mg total) into feeding tube every 4 (four) hours as needed., Disp: 30  tablet, Rfl: 0 Water For Irrigation, Sterile (FREE WATER) SOLN, Place 500 mLs into feeding tube every 4 (four) hours., Disp: , Rfl:  Review of Systems  Constitutional: Negative for appetite change and fatigue.  Respiratory: Negative for cough, shortness of breath and wheezing.   Cardiovascular: Negative for chest pain.  Gastrointestinal: Negative for abdominal pain and constipation.  Skin: Negative.    Filed Vitals:   05/09/12 2033  BP: 100/59  Pulse: 83  Weight: 177 lb 12.8 oz (80.65 kg)      Objective:   Physical Exam  Constitutional: He appears well-developed and well-nourished.  Neck: Neck supple.  Cardiovascular: Normal rate, regular rhythm and intact distal pulses.   Pulmonary/Chest: Effort normal and breath sounds normal.  Abdominal: Soft. Bowel sounds are normal.  Neurological: He is alert.  Skin: Skin is warm and dry.  Psychiatric: He has a normal mood and affect.   Lab reviewed 03-09-12: wbc 7.1; hgb 10.3; hct 31.2; mcv 80.0 plt 192; glucose 135; bun 42; creat 1.58; k+ 4.2;  na++ 137;  04-01-12: hgb a1c 7.6     Assessment:    PE and long term anticoagulation therapy    Plan:   will reduce the coumadin to 8.5 mg daily will check inr in one week. Will continue to monitor his status

## 2012-05-09 NOTE — Assessment & Plan Note (Addendum)
His inr is 3.3 and she is taking coumadin 8 mg daily

## 2012-05-09 NOTE — Assessment & Plan Note (Addendum)
Sis is presently stable no signs of further clotting issues present is on long term coumadin therapy

## 2012-05-19 ENCOUNTER — Non-Acute Institutional Stay (SKILLED_NURSING_FACILITY): Payer: Medicare Other | Admitting: Internal Medicine

## 2012-05-19 DIAGNOSIS — R1319 Other dysphagia: Secondary | ICD-10-CM

## 2012-05-19 DIAGNOSIS — Z7901 Long term (current) use of anticoagulants: Secondary | ICD-10-CM

## 2012-05-19 DIAGNOSIS — R319 Hematuria, unspecified: Secondary | ICD-10-CM

## 2012-05-19 DIAGNOSIS — I2699 Other pulmonary embolism without acute cor pulmonale: Secondary | ICD-10-CM

## 2012-06-01 NOTE — Progress Notes (Signed)
Patient ID: Noah Torres, male   DOB: 1940/07/14, 72 y.o.   MRN: [id number]        PROGRESS NOTE  DATE:   05/19/2012  FACILITY:  [facility]   LEVEL OF CARE: SNF  Acute Visit  CHIEF COMPLAINT:  Manage hematuria and anticoagulation.    HISTORY OF PRESENT ILLNESS: I was requested by the staff to assess the patient regarding above problem(s):  HEMATURIA:  The patient has blood in his Foley catheter.  He denies abdominal pain or flank pain.  This is a new problem, noted this morning.    ANTICOAGULATION:  The patient's INR on 05/09/2012 was 3.3.  His coumadin was decreased to 8.5 mg q.d. From 9 mg since then.  Today, INR is 1.7.  On 05/16/2012, INR was 2.1.  The patient is on coumadin secondary to PE.  He denies chest pains or shortness of breath.    PAST MEDICAL HISTORY : Reviewed.  No changes.  CURRENT MEDICATIONS: Reviewed per [facility]  REVIEW OF SYSTEMS:  GENERAL: no change in appetite, no fatigue, no weight changes, no fever, chills or weakness RESPIRATORY: no cough, SOB, DOE,, wheezing, hemoptysis CARDIAC: no chest pain, edema or palpitations GI: no abdominal pain, diarrhea, constipation, heart burn, nausea or vomiting  PHYSICAL EXAMINATION  GENERAL: no acute distress, normal body habitus  EYES: conjunctivae normal, sclerae normal, normal eye lids NECK: supple, trachea midline, no neck masses, no thyroid tenderness, no thyromegaly LYMPHATICS: no LAN in the neck, no supraclavicular LAN RESPIRATORY: breathing is even & unlabored, BS CTAB CARDIAC: RRR, no murmur,no extra heart sounds, no edema GI: abdomen soft, normal BS, no masses, no tenderness, no hepatomegaly, no splenomegaly, patient has a PEG PSYCHIATRIC: the patient is alert & oriented to person, affect & behavior appropriate  ASSESSMENT/PLAN:  Hematuria.  New problem.  Unlikely from anticoagulation since INR is only 1.7.   Check UA, culture and sensitivities.    PE.  Stable.    Anticoagulation.  INR is  subtherapeutic, but on 05/16/2012 it was therapeutic.  Therefore, continue coumadin at 8.5 mg q.d.  Recheck on 05/23/2012.    Dysphagia.  Patient's weights have been increasing.  In February, weights were 170.2 and 173.8.  In March, last recorded weight is 177.8.  He is not on speech therapy now.  Tolerating full meals.  Therefore, discontinue PEG tube.    CPT CODE: 40981.

## 2012-06-15 ENCOUNTER — Non-Acute Institutional Stay (SKILLED_NURSING_FACILITY): Payer: Medicare Other | Admitting: Internal Medicine

## 2012-06-15 DIAGNOSIS — I2699 Other pulmonary embolism without acute cor pulmonale: Secondary | ICD-10-CM

## 2012-06-15 DIAGNOSIS — I10 Essential (primary) hypertension: Secondary | ICD-10-CM

## 2012-06-15 DIAGNOSIS — K219 Gastro-esophageal reflux disease without esophagitis: Secondary | ICD-10-CM

## 2012-06-15 DIAGNOSIS — E119 Type 2 diabetes mellitus without complications: Secondary | ICD-10-CM

## 2012-07-04 ENCOUNTER — Non-Acute Institutional Stay (SKILLED_NURSING_FACILITY): Payer: Medicare Other | Admitting: Adult Health

## 2012-07-04 ENCOUNTER — Encounter: Payer: Self-pay | Admitting: Adult Health

## 2012-07-04 DIAGNOSIS — I2699 Other pulmonary embolism without acute cor pulmonale: Secondary | ICD-10-CM

## 2012-07-04 DIAGNOSIS — I10 Essential (primary) hypertension: Secondary | ICD-10-CM

## 2012-07-04 DIAGNOSIS — K219 Gastro-esophageal reflux disease without esophagitis: Secondary | ICD-10-CM | POA: Insufficient documentation

## 2012-07-04 DIAGNOSIS — Z86711 Personal history of pulmonary embolism: Secondary | ICD-10-CM | POA: Insufficient documentation

## 2012-07-04 DIAGNOSIS — E119 Type 2 diabetes mellitus without complications: Secondary | ICD-10-CM

## 2012-07-04 DIAGNOSIS — I2782 Chronic pulmonary embolism: Secondary | ICD-10-CM

## 2012-07-04 DIAGNOSIS — F411 Generalized anxiety disorder: Secondary | ICD-10-CM

## 2012-07-04 DIAGNOSIS — Z7901 Long term (current) use of anticoagulants: Secondary | ICD-10-CM

## 2012-07-04 NOTE — Assessment & Plan Note (Signed)
Will continue coreg 12.5 mg twice daily

## 2012-07-04 NOTE — Assessment & Plan Note (Signed)
Will continue lantus 20 units nightly and novolog 5 units prior to meals for cbg>=175

## 2012-07-04 NOTE — Assessment & Plan Note (Signed)
Will continue protonix 40 mg daily  

## 2012-07-04 NOTE — Progress Notes (Signed)
Patient ID: Noah Torres, male   DOB: [birth year], 72 y.o.   MRN: [id number]        PROGRESS NOTE  DATE: 06/15/2012  FACILITY: Nursing Home Location: [facility]  LEVEL OF CARE: SNF (31)  Routine Visit  CHIEF COMPLAINT:  Manage hypertension and diabetes mellitus.    HISTORY OF PRESENT ILLNESS:  REASSESSMENT OF ONGOING PROBLEM(S):  HTN: Pt 's HTN remains stable.  Denies CP, sob, DOE, pedal edema, headaches, dizziness or visual disturbances.  No complications from the medications currently being used.  Last BP : 150/84.  DM:pt's DM remains stable.  Pt denies polyuria, polydipsia, polyphagia, changes in vision or hypoglycemic episodes.  No complications noted from the medication presently being used.  Last hemoglobin A1c is: 7.6 in 03/2012.    PAST MEDICAL HISTORY : Reviewed.  No changes.  CURRENT MEDICATIONS: Reviewed per [facility]  REVIEW OF SYSTEMS:  GENERAL: no change in appetite, no fatigue, no weight changes, no fever, chills or weakness RESPIRATORY: no cough, SOB, DOE, wheezing, hemoptysis CARDIAC: no chest pain, edema or palpitations GI: no abdominal pain, diarrhea, constipation, heart burn, nausea or vomiting  PHYSICAL EXAMINATION  VS:  T 97.2      P 66     RR 20     BP 150/84   POX %     WT (Lb) 179  GENERAL: no acute distress, normal body habitus NECK: supple, trachea midline, no neck masses, no thyroid tenderness, no thyromegaly RESPIRATORY: breathing is even & unlabored, BS CTAB CARDIAC: RRR, no murmur,no extra heart sounds, no edema GI: abdomen soft, normal BS, no masses, no tenderness, no hepatomegaly, no splenomegaly PSYCHIATRIC: the patient is alert & oriented to person, affect & behavior appropriate  LABS/RADIOLOGY: 02/2012:  Hemoglobin 10.3, MCV 80, otherwise CBC normal.    Glucose 135, BUN 42, creatinine 1.58, otherwise CMP normal.    01/2012:  Alkaline phosphatase 146, AST 43, albumin 2.9, otherwise liver profile normal.     ASSESSMENT/PLAN:  Hypertension 405.11.  Well controlled.   Diabetes mellitus.  Lantus was decreased due to hypoglycemia.    GERD.  Well controlled.    PE.  Continue Coumadin.    Chronic kidney disease.  Stable.    CPT CODE: 78295

## 2012-07-04 NOTE — Assessment & Plan Note (Signed)
Will continue coumadin therapy and will monitor his status

## 2012-07-04 NOTE — Assessment & Plan Note (Addendum)
Will continue ativan 0.25 mg every 6 hours as needed

## 2012-07-04 NOTE — Assessment & Plan Note (Signed)
For is inr of 2.3 will continue his coumadin 8.5 mg daily and will check inr in 2 weeks.

## 2012-07-04 NOTE — Progress Notes (Signed)
Patient ID: Noah Torres, male   DOB: 1940/08/20, 72 y.o.   MRN: [id number]  FACILITY: [facility] Allergies  Allergen Reactions  . Metformin And Related      Chief Complaint  Patient presents with  . Medical Managment of Chronic Issues    HPI: He is being seen today for the management of chronic illnesses; there are no issues present his inr is therapeutic an dis on coumadin therapy for pulmonary embolism. He remains stable in his overall status.   Past Medical History  Diagnosis Date  . Diabetes mellitus, type 2   . Hypercholesterolemia   . Former smoker   . Bilateral hearing loss     Wears hearing aids  . Chronic kidney disease   . Hearing loss     uses hearing aids    Past Surgical History  Procedure Laterality Date  . Spine surgery      VITAL SIGNS BP 118/74  Pulse 67  Ht 6' (1.829 m)  Wt 179 lb 6.4 oz (81.375 kg)  BMI 24.33 kg/m2   Patient's Medications  New Prescriptions   No medications on file  Previous Medications   CARVEDILOL (COREG) 12.5 MG TABLET    Place 1 tablet (12.5 mg total) into feeding tube 2 (two) times daily with a meal.   INSULIN ASPART (NOVOLOG) 100 UNIT/ML INJECTION    Inject 5 Units into the skin 3 (three) times daily before meals. Give 5 units prior to meals for cbg >=175   INSULIN GLARGINE (LANTUS) 100 UNIT/ML INJECTION    Inject 20 Units into the skin daily.    LORAZEPAM (ATIVAN) 0.5 MG TABLET    Take 0.25 mg by mouth every 6 (six) hours as needed for anxiety.   OXYCODONE (OXY IR/ROXICODONE) 5 MG IMMEDIATE RELEASE TABLET    Place 1 tablet (5 mg total) into feeding tube every 4 (four) hours as needed.   PANTOPRAZOLE SODIUM (PROTONIX) 40 MG/20 ML PACK    Place 20 mLs (40 mg total) into feeding tube daily.   WARFARIN (COUMADIN) 7.5 MG TABLET    Take 8.5 mg by mouth one time only at 6 PM.   Modified Medications   No medications on file  Discontinued Medications   ENOXAPARIN (LOVENOX) 100 MG/ML INJECTION    Inject 0.85 mLs (85 mg  total) into the skin daily.   NUTRITIONAL SUPPLEMENTS (FEEDING SUPPLEMENT, OSMOLITE 1.2 CAL,) LIQD    Place 1,000 mLs into feeding tube continuous.   WATER FOR IRRIGATION, STERILE (FREE WATER) SOLN    Place 500 mLs into feeding tube every 4 (four) hours.    SIGNIFICANT DIAGNOSTIC EXAMS   03-15-12: chest x-ray: interval slight improvement in pulmonary aeration from prior study.     Component Value Date/Time   ALBUMIN 2.1* 01/21/2012 0720   AST 36 01/15/2012 0440   ALT 53 01/15/2012 0440   ALKPHOS 120* 01/15/2012 0440   BILITOT 0.4 01/15/2012 0440       Component Value Date/Time   BUN 53* 01/21/2012 0720   GLUCOSE 185* 01/21/2012 0720   CREATININE 4.16* 01/21/2012 0720   K 3.6 01/21/2012 0720   NA 139 01/21/2012 0720       Component Value Date/Time   WBC 5.1 01/21/2012 0720   RBC 3.72* 01/21/2012 0720   HGB 9.7* 01/21/2012 0720   HCT 30.3* 01/21/2012 0720   PLT 468* 01/21/2012 0720   MCV 81.5 01/21/2012 0720   02-26-12: glucose 66; bun 33; creat 1.93; k+ 4.5; na++136 03-09-12: wbc 7.1;  hgb 10.3; hct 31.2 ;mcv80.0; plt 192; glucose 135; bun 42; bun 1.58; k+ 4.2;  na++ 137 04-01-12: hgb a1c 7.6   Review of Systems  Constitutional: Negative for malaise/fatigue.  Respiratory: Negative for cough and shortness of breath.   Cardiovascular: Negative for chest pain.  Gastrointestinal: Negative for abdominal pain and constipation.  Musculoskeletal: Positive for myalgias and joint pain.  Skin: Negative.   Psychiatric/Behavioral: Negative for depression. The patient does not have insomnia.      Physical Exam  Constitutional: He appears well-nourished.  Neck: Neck supple.  Cardiovascular: Normal rate, regular rhythm and intact distal pulses.   Respiratory: Effort normal and breath sounds normal.  GI: Soft. Bowel sounds are normal. There is no tenderness.  Has peg tube  Musculoskeletal: He exhibits no edema.  Is able to move extremities  Neurological: He is alert.  Skin: Skin is warm  and dry.  Psychiatric: He has a normal mood and affect.       ASSESSMENT/ PLAN:   GERD (gastroesophageal reflux disease) Will continue protonix 40 mg daily   DM type 2 (diabetes mellitus, type 2) Will continue lantus 20 units nightly and novolog 5 units prior to meals for cbg>=175  HTN (hypertension) Will continue coreg 12.5 mg twice daily   PE (pulmonary thromboembolism) Will continue coumadin therapy and will monitor his status   Long term (current) use of anticoagulants For is inr of 2.3 will continue his coumadin 8.5 mg daily and will check inr in 2 weeks.   Anxiety state, unspecified Will continue ativan 0.25 mg every 6 hours as needed

## 2012-07-18 ENCOUNTER — Non-Acute Institutional Stay (SKILLED_NURSING_FACILITY): Payer: Medicare Other | Admitting: Adult Health

## 2012-07-18 ENCOUNTER — Encounter: Payer: Self-pay | Admitting: Adult Health

## 2012-07-18 DIAGNOSIS — I2699 Other pulmonary embolism without acute cor pulmonale: Secondary | ICD-10-CM

## 2012-07-18 DIAGNOSIS — Z7901 Long term (current) use of anticoagulants: Secondary | ICD-10-CM

## 2012-07-18 NOTE — Assessment & Plan Note (Signed)
He is presently stable will continue his coumadin therapy and will continue to monitor his status.

## 2012-07-18 NOTE — Assessment & Plan Note (Signed)
For his inr of 2.4 will continue coumadin 8.5 mg daily and will check inr in 2 weeks.

## 2012-07-18 NOTE — Progress Notes (Signed)
Patient ID: Noah Torres, male   DOB: 1940-11-24, 72 y.o.   MRN: [id number]  FACILITY: [facility]  Allergies  Allergen Reactions  . Metformin And Related      Chief Complaint  Patient presents with  . Acute Visit    coumadin management     HPI:  He is being seen for the chronic anticoagulation management related to his pulmonary embolus. He is presently stable there are no concerned being brought up by the staff at this time.   Past Medical History  Diagnosis Date  . Diabetes mellitus, type 2   . Hypercholesterolemia   . Former smoker   . Bilateral hearing loss     Wears hearing aids  . Chronic kidney disease   . Hearing loss     uses hearing aids    Past Surgical History  Procedure Laterality Date  . Spine surgery      VITAL SIGNS BP 106/67  Pulse 66  Ht 5\' 10"  (1.778 m)  Wt 179 lb 6.4 oz (81.375 kg)  BMI 25.74 kg/m2   Patient's Medications  New Prescriptions   No medications on file  Previous Medications   CARVEDILOL (COREG) 12.5 MG TABLET    Place 1 tablet (12.5 mg total) into feeding tube 2 (two) times daily with a meal.   INSULIN ASPART (NOVOLOG) 100 UNIT/ML INJECTION    Inject 5 Units into the skin 3 (three) times daily before meals. Give 5 units prior to meals for cbg >=175   INSULIN GLARGINE (LANTUS) 100 UNIT/ML INJECTION    Inject 20 Units into the skin daily.    LORAZEPAM (ATIVAN) 0.5 MG TABLET    Take 0.25 mg by mouth every 6 (six) hours as needed for anxiety.   OXYCODONE (OXY IR/ROXICODONE) 5 MG IMMEDIATE RELEASE TABLET    Place 1 tablet (5 mg total) into feeding tube every 4 (four) hours as needed.   PANTOPRAZOLE SODIUM (PROTONIX) 40 MG/20 ML PACK    Place 20 mLs (40 mg total) into feeding tube daily.   WARFARIN (COUMADIN) 7.5 MG TABLET    Take 8.5 mg by mouth daily.   Modified Medications   No medications on file  Discontinued Medications   No medications on file    SIGNIFICANT DIAGNOSTIC EXAMS  03-15-12: chest x-ray: interval slight  improvement in pulmonary aeration from prior study.       Component Value Date/Time   ALBUMIN 2.1* 01/21/2012 0720   AST 36 01/15/2012 0440   ALT 53 01/15/2012 0440   ALKPHOS 120* 01/15/2012 0440   BILITOT 0.4 01/15/2012 0440       Component Value Date/Time   BUN 53* 01/21/2012 0720   GLUCOSE 185* 01/21/2012 0720   CREATININE 4.16* 01/21/2012 0720   K 3.6 01/21/2012 0720   NA 139 01/21/2012 0720       Component Value Date/Time   WBC 5.1 01/21/2012 0720   RBC 3.72* 01/21/2012 0720   HGB 9.7* 01/21/2012 0720   HCT 30.3* 01/21/2012 0720   PLT 468* 01/21/2012 0720   MCV 81.5 01/21/2012 0720   02-26-12: glucose 66; bun 33; creat 1.93; k+ 4.5; na++136 03-09-12: wbc 7.1; hgb 10.3; hct 31.2 ;mcv80.0; plt 192; glucose 135; bun 42; bun 1.58; k+ 4.2;  na++ 137 04-01-12: hgb a1c 7.6    Review of Systems  Constitutional: Negative for malaise/fatigue.  Respiratory: Negative for cough and shortness of breath.   Cardiovascular: Negative for chest pain.  Gastrointestinal: Negative for abdominal pain and constipation.  Musculoskeletal: Positive for myalgias and joint pain.  Skin: Negative.   Psychiatric/Behavioral: Negative for depression. The patient does not have insomnia.      Physical Exam  Constitutional: He appears well-nourished.  Neck: Neck supple.  Cardiovascular: Normal rate, regular rhythm and intact distal pulses.   Respiratory: Effort normal and breath sounds normal.  GI: Soft. Bowel sounds are normal. There is no tenderness.  Has peg tube  Musculoskeletal: He exhibits no edema.  Is able to move extremities  Neurological: He is alert.  Skin: Skin is warm and dry.  Psychiatric: He has a normal mood and affect.       ASSESSMENT/ PLAN:  Chronic anticoagulation For his inr of 2.4 will continue coumadin 8.5 mg daily and will check inr in 2 weeks.   PE (pulmonary thromboembolism) He is presently stable will continue his coumadin therapy and will continue to monitor his  status.

## 2012-07-26 ENCOUNTER — Non-Acute Institutional Stay (SKILLED_NURSING_FACILITY): Payer: Medicare Other | Admitting: Internal Medicine

## 2012-07-26 DIAGNOSIS — E118 Type 2 diabetes mellitus with unspecified complications: Secondary | ICD-10-CM | POA: Insufficient documentation

## 2012-07-26 DIAGNOSIS — E1129 Type 2 diabetes mellitus with other diabetic kidney complication: Secondary | ICD-10-CM

## 2012-07-26 DIAGNOSIS — K219 Gastro-esophageal reflux disease without esophagitis: Secondary | ICD-10-CM

## 2012-07-26 DIAGNOSIS — I2699 Other pulmonary embolism without acute cor pulmonale: Secondary | ICD-10-CM

## 2012-07-26 DIAGNOSIS — I150 Renovascular hypertension: Secondary | ICD-10-CM

## 2012-07-26 NOTE — Progress Notes (Signed)
Patient ID: Noah Torres, male   DOB: 05/31/1940, 72 y.o.   MRN: [id number]        PROGRESS NOTE  DATE: 07/26/2012  FACILITY: Nursing Home Location: [facility]  LEVEL OF CARE: SNF (31)  Routine Visit  CHIEF COMPLAINT:  Manage hypertension and diabetes mellitus.    HISTORY OF PRESENT ILLNESS:  REASSESSMENT OF ONGOING PROBLEM(S):  HTN: Pt 's HTN remains stable.  Denies CP, sob, DOE, pedal edema, headaches, dizziness or visual disturbances.  No complications from the medications currently being used.  Last BP :113/69, 150/84.  DM:pt's DM remains stable.  Pt denies polyuria, polydipsia, polyphagia, changes in vision or hypoglycemic episodes.  No complications noted from the medication presently being used.  Last hemoglobin A1c is: 7.6 in 03/2012.    PAST MEDICAL HISTORY : Reviewed.  No changes.  CURRENT MEDICATIONS: Reviewed per [facility]  REVIEW OF SYSTEMS:  GENERAL: no change in appetite, no fatigue, no weight changes, no fever, chills or weakness RESPIRATORY: no cough, SOB, DOE, wheezing, hemoptysis CARDIAC: no chest pain, edema or palpitations GI: no abdominal pain, diarrhea, constipation, heart burn, nausea or vomiting  PHYSICAL EXAMINATION  VS:  T 98.8      P 69    RR 18    BP 113/69   POX %     WT (Lb) 176.4  GENERAL: no acute distress, normal body habitus NECK: supple, trachea midline, no neck masses, no thyroid tenderness, no thyromegaly RESPIRATORY: breathing is even & unlabored, BS CTAB CARDIAC: RRR, no murmur,no extra heart sounds, no edema GI: abdomen soft, normal BS, no masses, no tenderness, no hepatomegaly, no splenomegaly PSYCHIATRIC: the patient is alert & oriented to person, affect & behavior appropriate  LABS/RADIOLOGY: 02/2012:  Hemoglobin 10.3, MCV 80, otherwise CBC normal.    Glucose 135, BUN 42, creatinine 1.58, otherwise CMP normal.    01/2012:  Alkaline phosphatase 146, AST 43, albumin 2.9, otherwise liver profile normal.     ASSESSMENT/PLAN:  Hypertension 405.11.  Well controlled.   Diabetes mellitus.  Lantus was decreased due to hypoglycemia.  Check hemoglobin A1c.  GERD.  Well controlled.    PE.  Continue Coumadin.    Chronic kidney disease.  Stable.    CPT CODE: 50093

## 2012-08-01 ENCOUNTER — Non-Acute Institutional Stay (SKILLED_NURSING_FACILITY): Payer: Medicare Other | Admitting: Adult Health

## 2012-08-01 DIAGNOSIS — I2699 Other pulmonary embolism without acute cor pulmonale: Secondary | ICD-10-CM

## 2012-08-01 DIAGNOSIS — Z7901 Long term (current) use of anticoagulants: Secondary | ICD-10-CM

## 2012-08-23 ENCOUNTER — Non-Acute Institutional Stay (SKILLED_NURSING_FACILITY): Payer: Medicare Other | Admitting: Internal Medicine

## 2012-08-23 DIAGNOSIS — Z7901 Long term (current) use of anticoagulants: Secondary | ICD-10-CM

## 2012-08-23 DIAGNOSIS — I10 Essential (primary) hypertension: Secondary | ICD-10-CM

## 2012-08-23 DIAGNOSIS — I2699 Other pulmonary embolism without acute cor pulmonale: Secondary | ICD-10-CM

## 2012-08-23 DIAGNOSIS — E119 Type 2 diabetes mellitus without complications: Secondary | ICD-10-CM

## 2012-08-23 NOTE — Progress Notes (Signed)
Patient ID: Noah Torres, male   DOB: [birth year], 72 y.o.   MRN: [id number]        PROGRESS NOTE  DATE: 08/23/2012  FACILITY: Nursing Home Location: [facility]  LEVEL OF CARE: SNF (31)  Routine Visit  CHIEF COMPLAINT:  Manage anticoagulation, hypertension and diabetes mellitus.    HISTORY OF PRESENT ILLNESS:  REASSESSMENT OF ONGOING PROBLEM(S):  PULMONARY EMBOLISM: The pulmonary embolism remains stable. Patient is currently on anticoagulation.  Patient denies chest pain or shortness of breath. No complications reported from the anti-coagulation currently being used.  ANTICOAGULATION: Patient is currently on coumadin and does not report any bleeding. INR on 6/30 2.1, On 6/23 2.  HTN: Pt 's HTN remains stable.  Denies CP, sob, DOE, pedal edema, headaches, dizziness or visual disturbances.  No complications from the medications currently being used.  Last BP : 116/75,113/69, 150/84.  DM:pt's DM remains stable.  Pt denies polyuria, polydipsia, polyphagia, changes in vision or hypoglycemic episodes.  No complications noted from the medication presently being used.  Last hemoglobin A1c is: 7.6 in 03/2012, in 6/14 6.8.    PAST MEDICAL HISTORY : Reviewed.  No changes.  CURRENT MEDICATIONS: Reviewed per [facility]  REVIEW OF SYSTEMS:  GENERAL: no change in appetite, no fatigue, no weight changes, no fever, chills or weakness RESPIRATORY: no cough, SOB, DOE, wheezing, hemoptysis CARDIAC: no chest pain, edema or palpitations GI: no abdominal pain, diarrhea, constipation, heart burn, nausea or vomiting  PHYSICAL EXAMINATION  VS:  T 97.1      P 62    RR 18    BP 116/75   POX %     WT (Lb) 177.8  GENERAL: no acute distress, normal body habitus EYES: no scleral icterus, no conjunctival erythema, no discharge NECK: supple, trachea midline, no neck masses, no thyroid tenderness, no thyromegaly LYMPHATICS: no cervical LAN, no supraclavicular LAN RESPIRATORY: breathing  is even & unlabored, BS CTAB CARDIAC: RRR, no murmur,no extra heart sounds, no edema GI: abdomen soft, normal BS, no masses, no tenderness, no hepatomegaly, no splenomegaly PSYCHIATRIC: the patient is alert & oriented to person, affect & behavior appropriate  LABS/RADIOLOGY: 02/2012:  Hemoglobin 10.3, MCV 80, otherwise CBC normal.    Glucose 135, BUN 42, creatinine 1.58, otherwise CMP normal.    01/2012:  Alkaline phosphatase 146, AST 43, albumin 2.9, otherwise liver profile normal.    ASSESSMENT/PLAN:   Hypertension 405.11.  Well controlled.   Diabetes mellitus.  Lantus was decreased due to hypoglycemia.   GERD.  Well controlled.    PE.  Continue Coumadin.    Chronic kidney disease.  Stable.    V58.61-INR is therapeutic & stable.  Coumadin 8.5mg  continued.  Recheck INR in 2 wks ordered.  Check cbc & cmp.  CPT CODE: 40981

## 2012-08-30 ENCOUNTER — Non-Acute Institutional Stay (SKILLED_NURSING_FACILITY): Payer: Medicare Other | Admitting: Internal Medicine

## 2012-08-30 DIAGNOSIS — D638 Anemia in other chronic diseases classified elsewhere: Secondary | ICD-10-CM

## 2012-08-30 DIAGNOSIS — N179 Acute kidney failure, unspecified: Secondary | ICD-10-CM

## 2012-09-20 ENCOUNTER — Non-Acute Institutional Stay (SKILLED_NURSING_FACILITY): Payer: Medicare Other | Admitting: Internal Medicine

## 2012-09-20 DIAGNOSIS — I2699 Other pulmonary embolism without acute cor pulmonale: Secondary | ICD-10-CM

## 2012-09-20 DIAGNOSIS — Z7901 Long term (current) use of anticoagulants: Secondary | ICD-10-CM

## 2012-09-22 DIAGNOSIS — D509 Iron deficiency anemia, unspecified: Secondary | ICD-10-CM | POA: Insufficient documentation

## 2012-09-22 NOTE — Progress Notes (Signed)
Patient ID: Noah Torres, male   DOB: [birth year], 72 y.o.   MRN: [id number]        PROGRESS NOTE  DATE: 08/30/2012  FACILITY:  [facility]  LEVEL OF CARE: SNF (31)  Acute Visit  CHIEF COMPLAINT:  Manage anemia of chronic disease and renal insufficiency.    HISTORY OF PRESENT ILLNESS: I was requested by the staff to assess the patient regarding above problem(s):  ANEMIA: The anemia has been stable. The patient denies fatigue, melena or hematochezia. The patient is currently not on iron.   On 08/26/2012:  Hemoglobin 11.3, MCV 86.  In 02/2012:  Hemoglobin 10.3, MCV 80.  CHRONIC KIDNEY DISEASE: The patient's chronic kidney disease is unstable.  Patient denies increasing lower extremity swelling or confusion. Last BUN and creatinine are:   On 08/26/2012:  Creatinine 1.77.  In 02/2012:  BUN 42, creatinine 1.58.  The patient is not on any renal toxic medications.    PAST MEDICAL HISTORY : Reviewed.  No changes.  CURRENT MEDICATIONS: Reviewed per [facility]  REVIEW OF SYSTEMS:  GENERAL: no change in appetite, no fatigue, no weight changes, no fever, chills or weakness RESPIRATORY: no cough, SOB, DOE,, wheezing, hemoptysis CARDIAC: no chest pain, edema or palpitations GI: no abdominal pain, diarrhea, constipation, heart burn, nausea or vomiting  PHYSICAL EXAMINATION  GENERAL: no acute distress, normal body habitus EYES: conjunctivae normal, sclerae normal, normal eye lids NECK: supple, trachea midline, no neck masses, no thyroid tenderness, no thyromegaly LYMPHATICS: no LAN in the neck, no supraclavicular LAN RESPIRATORY: breathing is even & unlabored, BS CTAB CARDIAC: RRR, no murmur,no extra heart sounds, no edema GI: abdomen soft, normal BS, no masses, no tenderness, no hepatomegaly, no splenomegaly PSYCHIATRIC: the patient is alert & oriented to person, affect & behavior appropriate  ASSESSMENT/PLAN:  Acute renal failure.  Renal functions are worse.   Therefore, reassess.  Anemia of chronic disease.  Hemoglobin improved, but patient now has microcytosis which is a new problem.  Check iron studies.    CPT CODE: 13086

## 2012-09-22 NOTE — Progress Notes (Signed)
PROGRESS NOTE  DATE: 09/20/2012  FACILITY:  [facility]  LEVEL OF CARE: SNF (31)  Acute Visit  CHIEF COMPLAINT:  Manage anticoagulation  HISTORY OF PRESENT ILLNESS: I was requested by the staff to assess the patient regarding above problem(s):  PULMONARY EMBOLISM: The pulmonary embolism remains stable. Patient is currently on anticoagulation.  Patient denies chest pain or shortness of breath. No complications reported from the anti-coagulation currently being used.  ANTICOAGULATION: Patient is currently on coumadin and does not report any bleeding. INR on 09-19-12 is 1.7  PAST MEDICAL HISTORY : Reviewed.  No changes.  CURRENT MEDICATIONS: Reviewed per [facility]  REVIEW OF SYSTEMS:  GENERAL: no change in appetite, no fatigue, no weight changes, no fever, chills or weakness RESPIRATORY: no cough, SOB, DOE,, wheezing, hemoptysis CARDIAC: no chest pain, edema or palpitations GI: no abdominal pain, diarrhea, constipation, heart burn, nausea or vomiting  PHYSICAL EXAMINATION  GENERAL: no acute distress, normal body habitus RESPIRATORY: breathing is even & unlabored, BS CTAB CARDIAC: RRR, no murmur,no extra heart sounds, no edema GI: abdomen soft, normal BS, no masses, no tenderness, no hepatomegaly, no splenomegaly PSYCHIATRIC: the patient is alert & oriented to person, affect & behavior appropriate  LABS/RADIOLOGY: See history of present illness  ASSESSMENT/PLAN:  PE-stable V58.61-INR is subtherapeutic and unstable. Increase Coumadin to 7 mg daily. Check INR on 09-23-12.  CPT CODE: 11914

## 2012-09-27 ENCOUNTER — Non-Acute Institutional Stay (SKILLED_NURSING_FACILITY): Payer: Medicare Other | Admitting: Internal Medicine

## 2012-09-27 DIAGNOSIS — I2699 Other pulmonary embolism without acute cor pulmonale: Secondary | ICD-10-CM

## 2012-09-27 DIAGNOSIS — E1129 Type 2 diabetes mellitus with other diabetic kidney complication: Secondary | ICD-10-CM

## 2012-09-27 DIAGNOSIS — K219 Gastro-esophageal reflux disease without esophagitis: Secondary | ICD-10-CM

## 2012-09-27 DIAGNOSIS — I150 Renovascular hypertension: Secondary | ICD-10-CM

## 2012-09-27 DIAGNOSIS — Z7901 Long term (current) use of anticoagulants: Secondary | ICD-10-CM

## 2012-09-29 NOTE — Progress Notes (Signed)
Patient ID: Noah Torres, male   DOB: 21-Apr-1940, 72 y.o.   MRN: [id number]        PROGRESS NOTE  DATE: 09/27/2012  FACILITY: Nursing Home Location: [facility]  LEVEL OF CARE: SNF (31)  Routine Visit  CHIEF COMPLAINT:  Manage anticoagulation, hypertension and diabetes mellitus.    HISTORY OF PRESENT ILLNESS:  REASSESSMENT OF ONGOING PROBLEM(S):  PULMONARY EMBOLISM: The pulmonary embolism remains stable. Patient is currently on anticoagulation.  Patient denies chest pain or shortness of breath. No complications reported from the anti-coagulation currently being used.  ANTICOAGULATION: Patient is currently on coumadin and does not report any bleeding. INR on 8/8 is 1.9  HTN: Pt 's HTN remains stable.  Denies CP, sob, DOE, pedal edema, headaches, dizziness or visual disturbances.  No complications from the medications currently being used.  Last BP : 116/75,113/69, 150/84, 112/58.  DM:pt's DM remains stable.  Pt denies polyuria, polydipsia, polyphagia, changes in vision or hypoglycemic episodes.  No complications noted from the medication presently being used.  Last hemoglobin A1c is: 7.6 in 03/2012, in 6/14 6.8.    PAST MEDICAL HISTORY : Reviewed.  No changes.  CURRENT MEDICATIONS: Reviewed per [facility]  REVIEW OF SYSTEMS:  GENERAL: no change in appetite, no fatigue, no weight changes, no fever, chills or weakness RESPIRATORY: no cough, SOB, DOE, wheezing, hemoptysis CARDIAC: no chest pain, edema or palpitations GI: no abdominal pain, diarrhea, constipation, heart burn, nausea or vomiting  PHYSICAL EXAMINATION  VS:  T 97.2      P 62    RR 18    BP 112/58   POX %     WT (Lb) 177.2  GENERAL: no acute distress, normal body habitus EYES: no scleral icterus, no conjunctival erythema, no discharge NECK: supple, trachea midline, no neck masses, no thyroid tenderness, no thyromegaly LYMPHATICS: no cervical LAN, no supraclavicular LAN RESPIRATORY: breathing  is even & unlabored, BS CTAB CARDIAC: RRR, no murmur,no extra heart sounds, no edema GI: abdomen soft, normal BS, no masses, no tenderness, no hepatomegaly, no splenomegaly PSYCHIATRIC: the patient is alert & oriented to person, affect & behavior appropriate  LABS/RADIOLOGY: 7/14 serum iron 41, TIBC 291, percent saturation 14, BUN 19, creatinine 1.68, ferritin 311, hemoglobin 11.3, MCV 76 otherwise CBC normal, creatinine 1.77 otherwise CMP normal 02/2012:  Hemoglobin 10.3, MCV 80, otherwise CBC normal.    Glucose 135, BUN 42, creatinine 1.58, otherwise CMP normal.    01/2012:  Alkaline phosphatase 146, AST 43, albumin 2.9, otherwise liver profile normal.    ASSESSMENT/PLAN:   Hypertension 405.11.  Well controlled.   Diabetes mellitus.  Lantus was decreased due to hypoglycemia.   GERD.  Well controlled.    PE.  Continue Coumadin.    Chronic kidney disease.  Stable.    V58.61-INR is therapeutic & stable.  Coumadin 7 mg continued.  Recheck INR on 8/13.  CPT CODE: 78469

## 2012-10-07 ENCOUNTER — Non-Acute Institutional Stay (SKILLED_NURSING_FACILITY): Payer: Medicare Other | Admitting: Internal Medicine

## 2012-10-07 DIAGNOSIS — I2699 Other pulmonary embolism without acute cor pulmonale: Secondary | ICD-10-CM

## 2012-10-07 DIAGNOSIS — R339 Retention of urine, unspecified: Secondary | ICD-10-CM

## 2012-10-07 NOTE — Progress Notes (Signed)
Patient ID: Noah Torres, male   DOB: [birth year], 72 y.o.   MRN: [id number] Facility; [facility] Chief complaint; followup medical issues including pulmonary embolism, urinary retention History; this is a 72 year old man who came to [place] in December 2013. He had been admitted to hospital acutely with a saddle pulmonary embolism acute renal failure shock and respiratory failure requiring intubation. After intubation he went into a PEA cardiac arrest. On his arrival he did to the nursing home he had significant neurologic damage for which she had been previously reviewed by neurology in the hospital. This was felt to be secondary to his PEA cardiac arrest as his MRI did not show any focal damage. I was somewhat skeptical when I admitted him in December 2013 that he would improve to any major degree.  A separate issue is that he came here with a Foley catheter in place. The exact diagnosis necessitating a chronic Foley catheter was never really clear to me. He did not have prostate enlargement. I plans to actually give him a voiding trial although his care was subsequently taken over by other members of our team and the issue was never really addressed by our service. Recently the Foley catheter is been removed and he is voiding well as far as I can tell.  Review of systems Respiratory no cough no sputum no shortness of breath Cardiac no chest pain GI no abnormal pain GU there does not appear to be any voiding complaints. He does not complain of dysuria hematuria urinary hesitancy hesitancy or frequent  On examination Gen. he has been more than 8 months since I saw this man and I was truly amazed that the improvement.  Respiratory clear entry bilaterally Cardiac heart sounds are normal no murmurs GU bladder is not distended there is no costovertebral angle tenderness or suprapubic tenderness Neurologic; his gait is somewhat wide-based with a Trendelenburg however his balance is not all that  bad   Impression/plan #1 neurologic damage secondary to a PEA cardiac arrest in the setting of a massive pulmonary embolism. He has made a remarkable recovery, this is much better than I would've forecast when I admitted him. #2 pulmonary embolism; there is no doubt that this was the primary event that led to a fairy complicated critical care hospitalization. Nonetheless I am uncertain that he needs to be on chronic Coumadin. A D-dimer assay will be helpful. If this is not elevated and he does not have a history of prior venous thromboembolism then the Coumadin may be able to be stopped. #3 urinary retention; the lack of a voiding trial at some time during his stay at this SNF is clearly an oversight on behalf of our service and not the nursing home. As far as the skilled facility is concerned there was a diagnosis of urinary retention provided by me on admission. Although I had planned to give him a voiding trial, this was never done which again I feel is a provider oversight, not a deficiency on behalf of the facility. He appears to be voiding adequately at present. There is no evidence of urinary retention at the bedside.

## 2012-10-10 ENCOUNTER — Non-Acute Institutional Stay (SKILLED_NURSING_FACILITY): Payer: Medicare Other | Admitting: Internal Medicine

## 2012-10-10 DIAGNOSIS — Z7901 Long term (current) use of anticoagulants: Secondary | ICD-10-CM

## 2012-10-10 DIAGNOSIS — I2699 Other pulmonary embolism without acute cor pulmonale: Secondary | ICD-10-CM

## 2012-10-18 ENCOUNTER — Non-Acute Institutional Stay (SKILLED_NURSING_FACILITY): Payer: Medicare Other | Admitting: Internal Medicine

## 2012-10-18 DIAGNOSIS — I2699 Other pulmonary embolism without acute cor pulmonale: Secondary | ICD-10-CM

## 2012-10-18 DIAGNOSIS — Z7901 Long term (current) use of anticoagulants: Secondary | ICD-10-CM

## 2012-10-19 NOTE — Progress Notes (Signed)
PROGRESS NOTE  DATE: 10-18-12  FACILITY:  [facility]  LEVEL OF CARE: SNF (31)  Acute Visit  CHIEF COMPLAINT:  Manage anticoagulation  HISTORY OF PRESENT ILLNESS: I was requested by the staff to assess the patient regarding above problem(s):  PULMONARY EMBOLISM: The pulmonary embolism remains stable. Patient is currently on anticoagulation.  Patient denies chest pain or shortness of breath. No complications reported from the anti-coagulation currently being used.  ANTICOAGULATION: Patient is currently on coumadin and does not report any bleeding. INR on 10-17-12 is 2.2.  PAST MEDICAL HISTORY : Reviewed.  No changes.  CURRENT MEDICATIONS: Reviewed per [facility]  PHYSICAL EXAMINATION  GENERAL: no acute distress, normal body habitus RESPIRATORY: breathing is even & unlabored, BS CTAB CARDIAC: RRR, no murmur,no extra heart sounds, no edema   LABS/RADIOLOGY: See history of present illness  ASSESSMENT/PLAN:  PE-stable V58.61-INR is therapeutic and stable. Continue Coumadin to 7 mg daily. Check INR in 4 wks  CPT CODE: 91478

## 2012-10-24 ENCOUNTER — Encounter: Payer: Self-pay | Admitting: Adult Health

## 2012-10-24 ENCOUNTER — Non-Acute Institutional Stay (SKILLED_NURSING_FACILITY): Payer: Medicare Other | Admitting: Adult Health

## 2012-10-24 DIAGNOSIS — K219 Gastro-esophageal reflux disease without esophagitis: Secondary | ICD-10-CM

## 2012-10-24 DIAGNOSIS — I10 Essential (primary) hypertension: Secondary | ICD-10-CM

## 2012-10-24 DIAGNOSIS — I2699 Other pulmonary embolism without acute cor pulmonale: Secondary | ICD-10-CM

## 2012-10-24 DIAGNOSIS — E1129 Type 2 diabetes mellitus with other diabetic kidney complication: Secondary | ICD-10-CM

## 2012-10-24 DIAGNOSIS — Z7901 Long term (current) use of anticoagulants: Secondary | ICD-10-CM

## 2012-10-24 DIAGNOSIS — F411 Generalized anxiety disorder: Secondary | ICD-10-CM

## 2012-10-24 NOTE — Assessment & Plan Note (Signed)
Will continue protonix 40 mg daily and will monitor his status

## 2012-10-24 NOTE — Assessment & Plan Note (Signed)
Is stable will continue coreg 12.5 mg twice daily and will monitor his status

## 2012-10-24 NOTE — Assessment & Plan Note (Signed)
Will continue lantus 20 units nightly and novolog 5 units prior to  meals for cbg >=175

## 2012-10-24 NOTE — Progress Notes (Signed)
Patient ID: Noah Torres, male   DOB: 1940/07/16, 72 y.o.   MRN: [id number]  [facility]  Allergies  Allergen Reactions  . Metformin And Related      Chief Complaint  Patient presents with  . Medical Managment of Chronic Issues    HPI: He is being seen for the management of his chronic illnesses. His inr os 3.0 and he is taking coumadin 7 mg daily. There are no concerns being voiced by the nursing staff at this time. He tells me that his feeding tube was removed about one month ago and is now eating real food.   Past Medical History  Diagnosis Date  . Diabetes mellitus, type 2   . Hypercholesterolemia   . Former smoker   . Bilateral hearing loss     Wears hearing aids  . Chronic kidney disease   . Hearing loss     uses hearing aids    Past Surgical History  Procedure Laterality Date  . Spine surgery      VITAL SIGNS BP 112/71  Pulse 70  Ht 5\' 10"  (1.778 m)  Wt 181 lb 6.4 oz (82.283 kg)  BMI 26.03 kg/m2   Patient's Medications  New Prescriptions   No medications on file  Previous Medications   CARVEDILOL (COREG) 12.5 MG TABLET    Place 1 tablet (12.5 mg total) into feeding tube 2 (two) times daily with a meal.   INSULIN ASPART (NOVOLOG) 100 UNIT/ML INJECTION    Inject 5 Units into the skin 3 (three) times daily before meals. Give 5 units prior to meals for cbg >=175   INSULIN GLARGINE (LANTUS) 100 UNIT/ML INJECTION    Inject 20 Units into the skin daily.    LORAZEPAM (ATIVAN) 0.5 MG TABLET    Take 0.25 mg by mouth every 6 (six) hours as needed for anxiety.   OXYCODONE (OXY IR/ROXICODONE) 5 MG IMMEDIATE RELEASE TABLET    Place 1 tablet (5 mg total) into feeding tube every 4 (four) hours as needed.   PANTOPRAZOLE SODIUM (PROTONIX) 40 MG/20 ML PACK    Place 20 mLs (40 mg total) into feeding tube daily.   WARFARIN (COUMADIN) 7.5 MG TABLET    Take 7 mg by mouth daily.   Modified Medications   No medications on file  Discontinued Medications   No medications on  file    SIGNIFICANT DIAGNOSTIC EXAMS    LABS REVIEWED:   08-01-12: hgb a1c 6.8  08-26-12: wbc 4.4; hgb 11.3; hct 33.5 ;mcv 76.0; plt 219; glucose 79; bun 20; creat 1.77; k+4.4 Na++ 138; liver normal albumin 3.8 09-02-12: iron 41; tibc 290; ferritin 311 10-10-12: d-dimer: 7.44 (high)    Review of Systems  Constitutional: Negative for malaise/fatigue.  Respiratory: Negative for cough and shortness of breath.   Cardiovascular: Negative for chest pain and palpitations.  Gastrointestinal: Negative for heartburn and abdominal pain.  Musculoskeletal: Negative for myalgias and joint pain.  Skin: Negative.   Neurological: Negative for headaches.  Psychiatric/Behavioral: Negative for depression. The patient does not have insomnia.     Physical Exam  Constitutional: He appears well-developed and well-nourished.  Neck: Neck supple. No JVD present. No thyromegaly present.  Cardiovascular: Normal rate, regular rhythm and intact distal pulses.   Respiratory: Effort normal and breath sounds normal. No respiratory distress. He has no wheezes.  GI: Soft. Bowel sounds are normal. He exhibits no distension. There is no tenderness.  Musculoskeletal: Normal range of motion. He exhibits no edema.  Neurological: He is  alert.  Skin: Skin is warm and dry.    ASSESSMENT/ PLAN:  PE (pulmonary thromboembolism) He is presently stable will continue long term coumadin therapy and will continue to monitor his status   Long term (current) use of anticoagulants For inr of 3.0 will continue coumadin 7 mg daily and will check inr in one week.   Anxiety state, unspecified Is stable will continue ativan 0.25 mg every 6 hours as needed for anxiety and will monitor his status   GERD (gastroesophageal reflux disease) Will continue protonix 40 mg daily and will monitor his status   Type II or unspecified type diabetes mellitus with renal manifestations, not stated as uncontrolled(250.40) Will continue lantus  20 units nightly and novolog 5 units prior to  meals for cbg >=175   HTN (hypertension) Is stable will continue coreg 12.5 mg twice daily and will monitor his status     Time spent with patient 50 minutes.

## 2012-10-24 NOTE — Assessment & Plan Note (Signed)
He is presently stable will continue long term coumadin therapy and will continue to monitor his status

## 2012-10-24 NOTE — Assessment & Plan Note (Signed)
For inr of 3.0 will continue coumadin 7 mg daily and will check inr in one week.

## 2012-10-24 NOTE — Assessment & Plan Note (Signed)
Is stable will continue ativan 0.25 mg every 6 hours as needed for anxiety and will monitor his status

## 2012-11-01 ENCOUNTER — Non-Acute Institutional Stay (SKILLED_NURSING_FACILITY): Payer: Medicare Other | Admitting: Internal Medicine

## 2012-11-01 DIAGNOSIS — Z7901 Long term (current) use of anticoagulants: Secondary | ICD-10-CM

## 2012-11-01 DIAGNOSIS — I2699 Other pulmonary embolism without acute cor pulmonale: Secondary | ICD-10-CM

## 2012-11-02 NOTE — Progress Notes (Signed)
PROGRESS NOTE  DATE: 11-01-12  FACILITY:  [facility]  LEVEL OF CARE: SNF (31)  Acute Visit  CHIEF COMPLAINT:  Manage anticoagulation  HISTORY OF PRESENT ILLNESS: I was requested by the staff to assess the patient regarding above problem(s):  PULMONARY EMBOLISM: The pulmonary embolism remains stable. Patient is currently on anticoagulation.  Patient denies chest pain or shortness of breath. No complications reported from the anti-coagulation currently being used.  ANTICOAGULATION: Patient is currently on coumadin and does not report any bleeding. INR on 10-31-12 is 2.7.  On 9-14 INR was 3.  PAST MEDICAL HISTORY : Reviewed.  No changes.  CURRENT MEDICATIONS: Reviewed per [facility]  PHYSICAL EXAMINATION  GENERAL: no acute distress, normal body habitus RESPIRATORY: breathing is even & unlabored, BS CTAB CARDIAC: RRR, no murmur,no extra heart sounds, no edema   LABS/RADIOLOGY: See history of present illness  ASSESSMENT/PLAN:  PE-stable V58.61-INR is therapeutic and stable. Continue current Coumadin dose. Check INR in 2 wks  CPT CODE: 16109

## 2012-11-20 NOTE — Progress Notes (Signed)
Patient ID: Noah Torres, male   DOB: [birth year], 72 y.o.   MRN: [id number]  This is an acute visit.  Level of care skilled.  Facility is [facility].  Date is 10/10/2012.  Chief complaint-acute visit followup D. dimer results.and. anticoagulation management with history of pulmonar  yembolism-  History of present was.  Patient is an elderly resident with a history of pulmonary embolism-Dr. Leanord Hawking recently ordered a d-dimer secondary to her being on Coumadin-I think the concern was if the d-dimer is not elevated possibly she may come off the Coumadin.  However the d-dimer has come back elevated at 7.44.  Clinically patient appears to be stable no shortness of breath no chest pain complaints.  Her INR appears to be therapeutic it was 2.4 today  Family medical social history as been reviewed.--Per progress note on 05/09/2012  Medications have been reviewed.  Review of systems.  In general she denies any fever chills.  Respiratory no complaint shortness of breath or cough.  Cardiac history pulmonary embolism but denies any chest pain.  Neurologic no complaints of headache or dizziness.  Physical exam.  Temperature is 98.8 pulse 72 respirations 19 blood pressure 136/68.  In general this is a pleasant elderly male in no distress.  Her skin is warm and dry do not note any increased bruising or bleeding.  Her chest is clear to auscultation without rhonchi rales or wheezes.  Heart is regular rate and rhythm there is no lower extremity edema.  Labs.  08/26/2012.  Hemoglobin was 11.3 platelets 219.  Sodium 138 potassium 4.4 BUN 20 creatinine 1.71.  Assessment and plan.  #1-history of elevated d-dimer-clinically she is stable this was just ordered to see if she should continue on Coumadin and with its elevation that will be the case continue Coumadin indefinitely--clinically she stable does not show anything acutely that would indicate a pulmonary embolism   #2  anticoagulation management with history of pulmonary embolism-INRs therapeutic update INR is pending she continues on Coumadin.--This appears to be stable  CPT-99308  [id number]

## 2012-12-05 ENCOUNTER — Non-Acute Institutional Stay (SKILLED_NURSING_FACILITY): Payer: Medicare Other | Admitting: Internal Medicine

## 2012-12-05 DIAGNOSIS — I150 Renovascular hypertension: Secondary | ICD-10-CM

## 2012-12-05 DIAGNOSIS — Z7901 Long term (current) use of anticoagulants: Secondary | ICD-10-CM

## 2012-12-05 DIAGNOSIS — E1129 Type 2 diabetes mellitus with other diabetic kidney complication: Secondary | ICD-10-CM

## 2012-12-05 DIAGNOSIS — I2699 Other pulmonary embolism without acute cor pulmonale: Secondary | ICD-10-CM

## 2012-12-05 NOTE — Progress Notes (Signed)
Patient ID: Noah Torres, male   DOB: 1940/10/27, 72 y.o.   MRN: [id number]        PROGRESS NOTE  DATE: 12/05/2012  FACILITY: Nursing Home Location: [facility]  LEVEL OF CARE: SNF (31)  Routine Visit  CHIEF COMPLAINT:  Manage anticoagulation, hypertension and diabetes mellitus.    HISTORY OF PRESENT ILLNESS:  REASSESSMENT OF ONGOING PROBLEM(S):  PULMONARY EMBOLISM: The pulmonary embolism remains stable. Patient is currently on anticoagulation.  Patient denies chest pain or shortness of breath. No complications reported from the anti-coagulation currently being used.  ANTICOAGULATION: Patient is currently on coumadin and does not report any bleeding. INR on 10/20 is 2.5.  HTN: Pt 's HTN remains stable.  Denies CP, sob, DOE, pedal edema, headaches, dizziness or visual disturbances.  No complications from the medications currently being used.  Last BP : 116/75,113/69, 150/84, 112/58, 178/58.  DM:pt's DM remains stable.  Pt denies polyuria, polydipsia, polyphagia, changes in vision or hypoglycemic episodes.  No complications noted from the medication presently being used.  Last hemoglobin A1c is: 7.6 in 03/2012, in 6/14 was 6.8.    PAST MEDICAL HISTORY : Reviewed.  No changes.  CURRENT MEDICATIONS: Reviewed per [facility]  REVIEW OF SYSTEMS:  GENERAL: no change in appetite, no fatigue, no weight changes, no fever, chills or weakness RESPIRATORY: no cough, SOB, DOE, wheezing, hemoptysis CARDIAC: no chest pain, edema or palpitations GI: no abdominal pain, diarrhea, constipation, heart burn, nausea or vomiting  PHYSICAL EXAMINATION  VS:  T 97.1      P 88   RR 18    BP 178/58   POX %     WT (Lb) 189.6  GENERAL: no acute distress, normal body habitus EYES: no scleral icterus, no conjunctival erythema, no discharge NECK: supple, trachea midline, no neck masses, no thyroid tenderness, no thyromegaly LYMPHATICS: no cervical LAN, no supraclavicular  LAN RESPIRATORY: breathing is even & unlabored, BS CTAB CARDIAC: RRR, no murmur,no extra heart sounds, no edema GI: abdomen soft, normal BS, no masses, no tenderness, no hepatomegaly, no splenomegaly PSYCHIATRIC: the patient is alert & oriented to person, affect & behavior appropriate  LABS/RADIOLOGY: 7/14 serum iron 41, TIBC 291, percent saturation 14, BUN 19, creatinine 1.68, ferritin 311, hemoglobin 11.3, MCV 76 otherwise CBC normal, creatinine 1.77 otherwise CMP normal 02/2012:  Hemoglobin 10.3, MCV 80, otherwise CBC normal.    Glucose 135, BUN 42, creatinine 1.58, otherwise CMP normal.    01/2012:  Alkaline phosphatase 146, AST 43, albumin 2.9, otherwise liver profile normal.    ASSESSMENT/PLAN:   Hypertension 405.11. BP elevated.  Will review a log.  Diabetes mellitus.  Lantus was decreased due to hypoglycemia.   GERD.  Well controlled.    PE.  Continue Coumadin.    Chronic kidney disease.  Stable.    V58.61-INR is therapeutic & stable.  Coumadin 7 mg continued.  Recheck INR in 1 wk.  CPT CODE: 13086

## 2012-12-19 ENCOUNTER — Non-Acute Institutional Stay (SKILLED_NURSING_FACILITY): Payer: Medicare Other | Admitting: Internal Medicine

## 2012-12-19 DIAGNOSIS — I2699 Other pulmonary embolism without acute cor pulmonale: Secondary | ICD-10-CM

## 2012-12-19 DIAGNOSIS — Z7901 Long term (current) use of anticoagulants: Secondary | ICD-10-CM

## 2012-12-19 NOTE — Progress Notes (Signed)
Patient ID: Noah Torres, male   DOB: 05-Nov-1940, 72 y.o.   MRN: [id number]  [facility]  Allergies  Allergen Reactions  . Metformin And Related     Chief Complaint  Patient presents with  . Acute Visit    coumadin management     HPI He is being seen for the management of his coumadin therapy. He is on long coumadin therapy for his PE. He is tolerating coumadin therapy without any difficulty.  There are no concerns being voiced by the nursing staff at this time.   Past Medical History  Diagnosis Date  . Diabetes mellitus, type 2   . Hypercholesterolemia   . Former smoker   . Bilateral hearing loss     Wears hearing aids  . Chronic kidney disease   . Hearing loss     uses hearing aids    Past Surgical History  Procedure Laterality Date  . Spine surgery      Filed Vitals:   08/01/12 1431  BP: 115/74  Pulse: 73  Height: 5\' 10"  (1.778 m)  Weight: 176 lb (79.833 kg)    Current Outpatient Prescriptions on File Prior to Visit  Medication Sig Dispense Refill  . carvedilol (COREG) 12.5 MG tablet Place 1 tablet (12.5 mg total) into feeding tube 2 (two) times daily with a meal.  60 tablet  1  . insulin aspart (NOVOLOG) 100 UNIT/ML injection Inject 5 Units into the skin 3 (three) times daily before meals. Give 5 units prior to meals for cbg >=175      . insulin glargine (LANTUS) 100 UNIT/ML injection Inject 20 Units into the skin daily.       Marland Kitchen LORazepam (ATIVAN) 0.5 MG tablet Take 0.25 mg by mouth every 6 (six) hours as needed for anxiety.      Marland Kitchen oxyCODONE (OXY IR/ROXICODONE) 5 MG immediate release tablet Place 1 tablet (5 mg total) into feeding tube every 4 (four) hours as needed.  30 tablet  0  . pantoprazole sodium (PROTONIX) 40 mg/20 mL PACK Place 20 mLs (40 mg total) into feeding tube daily.  30 each    . warfarin (COUMADIN) 7.5 MG tablet Take 7 mg by mouth daily.        No current facility-administered medications on file prior to visit.    SIGNIFICANT DIAGNOSTIC  EXAMS  03-15-12: chest x-ray: interval slight improvement in pulmonary aeration from prior study.   LABS REVIEWED:   02-26-12: glucose 66; bun 33; creat 1.93; k+ 4.5; na++136 03-09-12: wbc 7.1; hgb 10.3; hct 31.2 ;mcv80.0; plt 192; glucose 135; bun 42; bun 1.58; k+ 4.2;  na++ 137 04-01-12: hgb a1c 7.6 08-01-12: hgb a1c 6.8    Review of Systems  Constitutional: Negative for malaise/fatigue.  Respiratory: Negative for cough and shortness of breath.   Cardiovascular: Negative for chest pain.  Gastrointestinal: Negative for abdominal pain and constipation.  Musculoskeletal: negative for pain.   Skin: Negative.   Psychiatric/Behavioral: Negative for depression. The patient does not have insomnia.      Physical Exam  Constitutional: He appears well-nourished.  Neck: Neck supple.  Cardiovascular: Normal rate, regular rhythm and intact distal pulses.   Respiratory: Effort normal and breath sounds normal.  GI: Soft. Bowel sounds are normal. There is no tenderness.  Has peg tube  Musculoskeletal: He exhibits no edema.  Is able to move extremities  Neurological: He is alert.  Skin: Skin is warm and dry.  Psychiatric: He has a normal mood and affect.  ASSESSMENT/ PLAN:  Chronic anticoagulation For his inr of 2.4 will continue coumadin 8.5 mg daily and will check inr in 2 weeks.   PE (pulmonary thromboembolism) He is presently stable will continue his coumadin therapy and will continue to monitor his status.

## 2012-12-19 NOTE — Progress Notes (Signed)
PROGRESS NOTE  DATE: 12/19/2012  FACILITY:  [facility]  LEVEL OF CARE: SNF (31)  Acute Visit  CHIEF COMPLAINT:  Manage anticoagulation  HISTORY OF PRESENT ILLNESS: I was requested by the staff to assess the patient regarding above problem(s):  PULMONARY EMBOLISM: The pulmonary embolism remains stable. Patient is currently on anticoagulation.  Patient denies chest pain or shortness of breath. No complications reported from the anti-coagulation currently being used.  ANTICOAGULATION: Patient is currently on coumadin and does not report any bleeding. INR on 12-19-12 3.5, on 12-12-12 INR 2.8  PAST MEDICAL HISTORY : Reviewed.  No changes.  CURRENT MEDICATIONS: Reviewed per [facility]  REVIEW OF SYSTEMS:  GENERAL: no change in appetite, no fatigue, no weight changes, no fever, chills or weakness RESPIRATORY: no cough, SOB, DOE,, wheezing, hemoptysis CARDIAC: no chest pain, edema or palpitations GI: no abdominal pain, diarrhea, constipation, heart burn, nausea or vomiting  PHYSICAL EXAMINATION  GENERAL: no acute distress, normal body habitus NECK: supple, trachea midline, no neck masses, no thyroid tenderness, no thyromegaly RESPIRATORY: breathing is even & unlabored, BS CTAB CARDIAC: RRR, no murmur,no extra heart sounds, no edema GI: abdomen soft, normal BS, no masses, no tenderness, no hepatomegaly, no splenomegaly PSYCHIATRIC: the patient is alert & oriented to person, affect & behavior appropriate  LABS/RADIOLOGY: See history of present illness  ASSESSMENT/PLAN:  PE-stable V58.61-INR is supratherapeutic and unstable. Hold Coumadin. Check INR on 11-6/14  CPT CODE: 86578

## 2012-12-22 ENCOUNTER — Non-Acute Institutional Stay (SKILLED_NURSING_FACILITY): Payer: Medicare Other | Admitting: Family

## 2012-12-22 ENCOUNTER — Encounter: Payer: Self-pay | Admitting: Family

## 2012-12-22 DIAGNOSIS — H6120 Impacted cerumen, unspecified ear: Secondary | ICD-10-CM

## 2012-12-22 DIAGNOSIS — H6123 Impacted cerumen, bilateral: Secondary | ICD-10-CM

## 2012-12-22 NOTE — Progress Notes (Signed)
Patient ID: Noah Torres, male   DOB: 05/29/1940, 72 y.o.   MRN: [id number] Date: 12/22/12  Facility: [facility]  Code Status:  Full Code  Chief Complaint  Patient presents with  . Acute Visit    Cerumen Impaction    HPI: Pt presents with c/o cerumen impaction to bilateral ears. Pt reports insidious buildup of cerumen and c/o further difficulty with hearing. Pt denies dizziness, HA, nausea, vomiting, diarrhea, or CP.  Pt denies further issues/ concerns.      Allergies  Allergen Reactions  . Metformin And Related      Medication List       This list is accurate as of: 12/22/12  3:34 PM.  Always use your most recent med list.               carvedilol 12.5 MG tablet  Commonly known as:  COREG  Place 1 tablet (12.5 mg total) into feeding tube 2 (two) times daily with a meal.     insulin aspart 100 UNIT/ML injection  Commonly known as:  novoLOG  Inject 5 Units into the skin 3 (three) times daily before meals. Give 5 units prior to meals for cbg >=175     insulin glargine 100 UNIT/ML injection  Commonly known as:  LANTUS  Inject 20 Units into the skin daily.     LORazepam 0.5 MG tablet  Commonly known as:  ATIVAN  Take 0.25 mg by mouth every 6 (six) hours as needed for anxiety.     oxyCODONE 5 MG immediate release tablet  Commonly known as:  Oxy IR/ROXICODONE  Place 1 tablet (5 mg total) into feeding tube every 4 (four) hours as needed.     pantoprazole sodium 40 mg/20 mL Pack  Commonly known as:  PROTONIX  Place 20 mLs (40 mg total) into feeding tube daily.     warfarin 7.5 MG tablet  Commonly known as:  COUMADIN  Take 7 mg by mouth daily.         DATA REVIEWED   Laboratory Studies: 08/25/12-WBC 4.4, RBC 4.41, Hemoglobin 11.3, Hematocrit 33.5, Platelet 219, Na 138, K 4.4, Chloride 108, BUN 20, Creatinine 1.77     Past Medical History  Diagnosis Date  . Diabetes mellitus, type 2   . Hypercholesterolemia   . Former smoker   . Bilateral hearing loss      Wears hearing aids  . Chronic kidney disease   . Hearing loss     uses hearing aids     Past Surgical History  Procedure Laterality Date  . Spine surgery        History   Social History  . Marital Status: Married    Spouse Name: N/A    Number of Children: N/A  . Years of Education: N/A   Occupational History  . Not on file.   Social History Main Topics  . Smoking status: Former Games developer  . Smokeless tobacco: Not on file  . Alcohol Use: No  . Drug Use: No  . Sexual Activity: Not on file   Other Topics Concern  . Not on file   Social History Narrative   Lives at home fully independent     Review of Systems  Constitutional: Negative.   HENT: Positive for hearing loss. Negative for ear discharge and ear pain.        Ear wax  Eyes: Negative.   Respiratory: Negative.   Cardiovascular: Negative.   Neurological: Negative.      Physical  Exam Filed Vitals:   12/22/12 1523  BP: 126/62  Pulse: 72  Temp: 98 F (36.7 C)  Resp: 18   There is no weight on file to calculate BMI. Physical Exam  Constitutional: He is oriented to person, place, and time. He appears well-developed and well-nourished. No distress.  HENT:  Right Ear: No swelling or tenderness. Decreased hearing is noted.  Left Ear: No swelling or tenderness. Decreased hearing is noted.  Brown Cerumen noted to external canals  Cardiovascular: Normal rate and regular rhythm.   Pulmonary/Chest: Effort normal and breath sounds normal.  Neurological: He is alert and oriented to person, place, and time.    ASSESSMENT/PLAN  Cerumen Impaction-Debrox 5 drops bid into ear x 4 days   Follow up:prn

## 2013-01-02 ENCOUNTER — Other Ambulatory Visit: Payer: Self-pay | Admitting: *Deleted

## 2013-01-02 MED ORDER — OXYCODONE HCL 5 MG PO TABS: ORAL_TABLET | ORAL | Status: DC

## 2013-01-09 ENCOUNTER — Non-Acute Institutional Stay (SKILLED_NURSING_FACILITY): Payer: Medicare Other | Admitting: Internal Medicine

## 2013-01-09 ENCOUNTER — Encounter: Payer: Self-pay | Admitting: Internal Medicine

## 2013-01-09 DIAGNOSIS — E119 Type 2 diabetes mellitus without complications: Secondary | ICD-10-CM

## 2013-01-09 DIAGNOSIS — I10 Essential (primary) hypertension: Secondary | ICD-10-CM

## 2013-01-09 DIAGNOSIS — I2699 Other pulmonary embolism without acute cor pulmonale: Secondary | ICD-10-CM

## 2013-01-09 DIAGNOSIS — K219 Gastro-esophageal reflux disease without esophagitis: Secondary | ICD-10-CM

## 2013-01-09 NOTE — Progress Notes (Signed)
Patient ID: Noah Torres, male   DOB: [birth year], 72 y.o.   MRN: [id number]        PROGRESS NOTE  DATE: 01/09/2013  FACILITY: Nursing Home Location: [facility]  LEVEL OF CARE: SNF (31)  Routine Visit  CHIEF COMPLAINT:  Manage hypertension and diabetes mellitus.    HISTORY OF PRESENT ILLNESS:  REASSESSMENT OF ONGOING PROBLEM(S):  PULMONARY EMBOLISM: The pulmonary embolism remains stable. Patient is currently on anticoagulation.  Patient denies chest pain or shortness of breath. No complications reported from the anti-coagulation currently being used.  HTN: Pt 's HTN remains stable.  Denies CP, sob, DOE, pedal edema, headaches, dizziness or visual disturbances.  No complications from the medications currently being used.  Last BP : 116/75,113/69, 150/84, 112/58, 178/58, 125/85.  DM:pt's DM remains stable.  Pt denies polyuria, polydipsia, polyphagia, changes in vision or hypoglycemic episodes.  No complications noted from the medication presently being used.  Last hemoglobin A1c is: 7.6 in 03/2012, in 6/14 was 6.8.    PAST MEDICAL HISTORY : Reviewed.  No changes.  CURRENT MEDICATIONS: Reviewed per [facility]  REVIEW OF SYSTEMS:  GENERAL: no change in appetite, no fatigue, no weight changes, no fever, chills or weakness RESPIRATORY: no cough, SOB, DOE, wheezing, hemoptysis CARDIAC: no chest pain, edema or palpitations GI: no abdominal pain, diarrhea, constipation, heart burn, nausea or vomiting  PHYSICAL EXAMINATION  VS:  T 98.2      P 77   RR 18    BP 125/85   POX %     WT (Lb) 192  GENERAL: no acute distress, normal body habitus NECK: supple, trachea midline, no neck masses, no thyroid tenderness, no thyromegaly RESPIRATORY: breathing is even & unlabored, BS CTAB CARDIAC: RRR, no murmur,no extra heart sounds, no edema GI: abdomen soft, normal BS, no masses, no tenderness, no hepatomegaly, no splenomegaly PSYCHIATRIC: the patient is alert & oriented to  person, affect & behavior appropriate  LABS/RADIOLOGY: 7/14 serum iron 41, TIBC 291, percent saturation 14, BUN 19, creatinine 1.68, ferritin 311, hemoglobin 11.3, MCV 76 otherwise CBC normal, creatinine 1.77 otherwise CMP normal 02/2012:  Hemoglobin 10.3, MCV 80, otherwise CBC normal.    Glucose 135, BUN 42, creatinine 1.58, otherwise CMP normal.    01/2012:  Alkaline phosphatase 146, AST 43, albumin 2.9, otherwise liver profile normal.    ASSESSMENT/PLAN:   Hypertension 405.11. BP well controlled  Diabetes mellitus.  Lantus was decreased due to hypoglycemia.   GERD.  Well controlled.    PE.  Continue Coumadin.    Chronic kidney disease.  Stable.    CPT CODE: 95621

## 2013-01-16 ENCOUNTER — Non-Acute Institutional Stay (SKILLED_NURSING_FACILITY): Payer: Medicare Other | Admitting: Internal Medicine

## 2013-01-16 DIAGNOSIS — Z7901 Long term (current) use of anticoagulants: Secondary | ICD-10-CM

## 2013-01-16 DIAGNOSIS — B370 Candidal stomatitis: Secondary | ICD-10-CM

## 2013-01-16 DIAGNOSIS — I2699 Other pulmonary embolism without acute cor pulmonale: Secondary | ICD-10-CM

## 2013-01-23 ENCOUNTER — Non-Acute Institutional Stay (SKILLED_NURSING_FACILITY): Payer: Medicare Other | Admitting: Internal Medicine

## 2013-01-23 DIAGNOSIS — Z7901 Long term (current) use of anticoagulants: Secondary | ICD-10-CM

## 2013-01-23 DIAGNOSIS — I2699 Other pulmonary embolism without acute cor pulmonale: Secondary | ICD-10-CM

## 2013-01-27 ENCOUNTER — Encounter: Payer: Self-pay | Admitting: Internal Medicine

## 2013-01-27 NOTE — Progress Notes (Signed)
PROGRESS NOTE  DATE: 01-23-13  FACILITY:  [facility]  LEVEL OF CARE: SNF (31)  Acute Visit  CHIEF COMPLAINT:  Manage anticoagulation  HISTORY OF PRESENT ILLNESS: I was requested by the staff to assess the patient regarding above problem(s):  PULMONARY EMBOLISM: The pulmonary embolism remains stable. Patient is currently on anticoagulation.  Patient denies chest pain or shortness of breath. No complications reported from the anti-coagulation currently being used.  ANTICOAGULATION: Patient is currently on coumadin and does not report any bleeding. INR on 01-23-13 is 1.4, on 12-5--14 INR 1.8  PAST MEDICAL HISTORY : Reviewed.  No changes.  CURRENT MEDICATIONS: Reviewed per [facility]  REVIEW OF SYSTEMS:  GENERAL: no change in appetite, no fatigue, no weight changes, no fever, chills or weakness RESPIRATORY: no cough, SOB, DOE,, wheezing, hemoptysis CARDIAC: no chest pain, edema or palpitations GI: no abdominal pain, diarrhea, constipation, heart burn, nausea or vomiting  PHYSICAL EXAMINATION  GENERAL: no acute distress, normal body habitus NECK: supple, trachea midline, no neck masses, no thyroid tenderness, no thyromegaly RESPIRATORY: breathing is even & unlabored, BS CTAB CARDIAC: RRR, no murmur,no extra heart sounds, no edema GI: abdomen soft, normal BS, no masses, no tenderness, no hepatomegaly, no splenomegaly PSYCHIATRIC: the patient is alert & oriented to person, affect & behavior appropriate  LABS/RADIOLOGY: See history of present illness  ASSESSMENT/PLAN:  PE-stable V58.61-INR is subtherapeutic and unstable. Increase Coumadin to 4.5 mg daily. Check INR in one week  CPT CODE: 78295

## 2013-01-30 ENCOUNTER — Non-Acute Institutional Stay (SKILLED_NURSING_FACILITY): Payer: Medicare Other | Admitting: Internal Medicine

## 2013-01-30 DIAGNOSIS — Z7901 Long term (current) use of anticoagulants: Secondary | ICD-10-CM

## 2013-01-30 DIAGNOSIS — I2699 Other pulmonary embolism without acute cor pulmonale: Secondary | ICD-10-CM

## 2013-01-30 NOTE — Progress Notes (Signed)
PROGRESS NOTE  DATE: 01-30-13  FACILITY:  [facility]  LEVEL OF CARE: SNF (31)  Acute Visit  CHIEF COMPLAINT:  Manage anticoagulation  HISTORY OF PRESENT ILLNESS: I was requested by the staff to assess the patient regarding above problem(s):  PULMONARY EMBOLISM: The pulmonary embolism remains stable. Patient is currently on anticoagulation.  Patient denies chest pain or shortness of breath. No complications reported from the anti-coagulation currently being used.  ANTICOAGULATION: Patient is currently on coumadin and does not report any bleeding. INR on 01-30-13 is 1.3, on 12-8--14 INR 1.4  PAST MEDICAL HISTORY : Reviewed.  No changes.  CURRENT MEDICATIONS: Reviewed per [facility]  REVIEW OF SYSTEMS:  GENERAL: no change in appetite, no fatigue, no weight changes, no fever, chills or weakness RESPIRATORY: no cough, SOB, DOE,, wheezing, hemoptysis CARDIAC: no chest pain, edema or palpitations GI: no abdominal pain, diarrhea, constipation, heart burn, nausea or vomiting  PHYSICAL EXAMINATION  GENERAL: no acute distress, normal body habitus NECK: supple, trachea midline, no neck masses, no thyroid tenderness, no thyromegaly RESPIRATORY: breathing is even & unlabored, BS CTAB CARDIAC: RRR, no murmur,no extra heart sounds, no edema GI: abdomen soft, normal BS, no masses, no tenderness, no hepatomegaly, no splenomegaly PSYCHIATRIC: the patient is alert & oriented to person, affect & behavior appropriate  LABS/RADIOLOGY: See history of present illness  ASSESSMENT/PLAN:  PE-stable V58.61-INR is subtherapeutic and unstable. Increase Coumadin to 5 mg daily. Check INR in one week  CPT CODE: 62130

## 2013-02-20 ENCOUNTER — Non-Acute Institutional Stay (SKILLED_NURSING_FACILITY): Payer: Medicare Other | Admitting: Internal Medicine

## 2013-02-20 DIAGNOSIS — Z7901 Long term (current) use of anticoagulants: Secondary | ICD-10-CM

## 2013-02-20 DIAGNOSIS — I2699 Other pulmonary embolism without acute cor pulmonale: Secondary | ICD-10-CM

## 2013-02-20 NOTE — Progress Notes (Signed)
PROGRESS NOTE  DATE: 02-20-13  FACILITY:  [facility]  LEVEL OF CARE: SNF (31)  Acute Visit  CHIEF COMPLAINT:  Manage anticoagulation  HISTORY OF PRESENT ILLNESS: I was requested by the staff to assess the patient regarding above problem(s):  PULMONARY EMBOLISM: The pulmonary embolism remains stable. Patient is currently on anticoagulation.  Patient denies chest pain or shortness of breath. No complications reported from the anti-coagulation currently being used.  ANTICOAGULATION: Patient is currently on coumadin and does not report any bleeding. INR on 02-20-13 is 1.8, INR on 02-15-13 and 1.6, 01-30-13 is 1.3, on 01-23-13 INR 1.4  PAST MEDICAL HISTORY : Reviewed.  No changes.  CURRENT MEDICATIONS: Reviewed per [facility]  REVIEW OF SYSTEMS:  GENERAL: no change in appetite, no fatigue, no weight changes, no fever, chills or weakness RESPIRATORY: no cough, SOB, DOE,, wheezing, hemoptysis CARDIAC: no chest pain, edema or palpitations GI: no abdominal pain, diarrhea, constipation, heart burn, nausea or vomiting  PHYSICAL EXAMINATION  GENERAL: no acute distress, normal body habitus NECK: supple, trachea midline, no neck masses, no thyroid tenderness, no thyromegaly RESPIRATORY: breathing is even & unlabored, BS CTAB CARDIAC: RRR, no murmur,no extra heart sounds, no edema GI: abdomen soft, normal BS, no masses, no tenderness, no hepatomegaly, no splenomegaly PSYCHIATRIC: the patient is alert & oriented to person, affect & behavior appropriate  LABS/RADIOLOGY: See history of present illness  ASSESSMENT/PLAN:  PE-stable V58.61-INR is subtherapeutic and unstable. Increase Coumadin to 6.5 mg daily on Monday, Wednesday and Friday. Keep Coumadin 5.5 mg on Tuesday, Thursday, Saturday and Sunday. Check INR in one week  CPT CODE: 62863

## 2013-02-27 ENCOUNTER — Non-Acute Institutional Stay (SKILLED_NURSING_FACILITY): Payer: Medicare Other | Admitting: Internal Medicine

## 2013-02-27 DIAGNOSIS — R509 Fever, unspecified: Secondary | ICD-10-CM

## 2013-02-27 DIAGNOSIS — R059 Cough, unspecified: Secondary | ICD-10-CM

## 2013-02-27 DIAGNOSIS — R05 Cough: Secondary | ICD-10-CM

## 2013-03-01 ENCOUNTER — Encounter: Payer: Self-pay | Admitting: Internal Medicine

## 2013-03-01 DIAGNOSIS — B370 Candidal stomatitis: Secondary | ICD-10-CM | POA: Insufficient documentation

## 2013-03-01 NOTE — Progress Notes (Signed)
Patient ID: Noah Torres, male   DOB: 09-01-1940, 73 y.o.   MRN: [id number]          PROGRESS NOTE  DATE: 01/16/2013    FACILITY:  [facility]  LEVEL OF CARE: SNF (31)  Acute Visit  CHIEF COMPLAINT:  Manage mouth pain and anticoagulation.    HISTORY OF PRESENT ILLNESS: I was requested by the staff to assess the patient regarding above problem(s):  PULMONARY EMBOLISM: The pulmonary embolism remains stable. Patient is currently on anticoagulation.  Patient denies chest pain or shortness of breath. No complications reported from the anti-coagulation currently being used.    ANTICOAGULATION: Patient is currently on coumadin and does not report any bleeding. INR on 01/16/2013 is 3.9.  On 12/30/2012, INR was 2.1.    MOUTH PAIN:  Staff report that patient is complaining of mouth pain and spits out frothy sputum.  Tongue has cottage cheese appearance.      PAST MEDICAL HISTORY : Reviewed.  No changes.  CURRENT MEDICATIONS: Reviewed per [facility]  REVIEW OF SYSTEMS:  GENERAL: no change in appetite, no fatigue, no weight changes, no fever, chills or weakness RESPIRATORY: no cough, SOB, DOE,, wheezing, hemoptysis CARDIAC: no chest pain, edema or palpitations GI: no abdominal pain, diarrhea, constipation, heart burn, nausea or vomiting  PHYSICAL EXAMINATION  VS:  T 98.1       P 72    RR 20      BP 136/80     POX %       WT (Lb)  GENERAL: no acute distress, normal body habitus EYES: conjunctivae normal, sclerae normal, normal eye lids NECK: supple, trachea midline, no neck masses, no thyroid tenderness, no thyromegaly LYMPHATICS: no LAN in the neck, no supraclavicular LAN RESPIRATORY: breathing is even & unlabored, BS CTAB CARDIAC: RRR, no murmur,no extra heart sounds, no edema GI: abdomen soft, normal BS, no masses, no tenderness, no hepatomegaly, no splenomegaly PSYCHIATRIC: the patient is alert & oriented to person, affect & behavior  appropriate MOUTH/THROAT: white coat on tongue noted, but no satellite lesions      ASSESSMENT/PLAN:  PE.  Stable.    Anticoagulation.  INR is supratherapeutic and unstable.  Hold Coumadin.  Check PT/INR on 01/19/2013.    Thrush.  New problem.  Nystatin 4 mL q.i.d., swish and swallow for seven days started.    THN Metrics:     Not on aspirin.  Nonsmoker.    CPT CODE: 82993

## 2013-03-06 ENCOUNTER — Non-Acute Institutional Stay (SKILLED_NURSING_FACILITY): Payer: Medicare Other | Admitting: Internal Medicine

## 2013-03-06 DIAGNOSIS — I150 Renovascular hypertension: Secondary | ICD-10-CM

## 2013-03-06 DIAGNOSIS — E1129 Type 2 diabetes mellitus with other diabetic kidney complication: Secondary | ICD-10-CM

## 2013-03-06 DIAGNOSIS — I2699 Other pulmonary embolism without acute cor pulmonale: Secondary | ICD-10-CM

## 2013-03-06 DIAGNOSIS — K219 Gastro-esophageal reflux disease without esophagitis: Secondary | ICD-10-CM

## 2013-03-10 NOTE — Progress Notes (Signed)
Patient ID: Noah Torres, male   DOB: 04/21/1940, 73 y.o.   MRN: [id number]         PROGRESS NOTE  DATE: 03/06/2013  FACILITY: Nursing Home Location: [facility]  LEVEL OF CARE: SNF (31)  Routine Visit  CHIEF COMPLAINT:  Manage hypertension and diabetes mellitus.    HISTORY OF PRESENT ILLNESS:  REASSESSMENT OF ONGOING PROBLEM(S):  PULMONARY EMBOLISM: The pulmonary embolism remains stable. Patient is currently on anticoagulation.  Patient denies chest pain or shortness of breath. No complications reported from the anti-coagulation currently being used.  HTN: Pt 's HTN remains stable.  Denies CP, sob, DOE, pedal edema, headaches, dizziness or visual disturbances.  No complications from the medications currently being used.  Last BP : 116/75,113/69, 150/84, 112/58, 178/58, 125/85, 114/72.  DM:pt's DM remains stable.  Pt denies polyuria, polydipsia, polyphagia, changes in vision or hypoglycemic episodes.  No complications noted from the medication presently being used.  Last hemoglobin A1c is: 7.6 in 03/2012, in 6/14 was 6.8.    PAST MEDICAL HISTORY : Reviewed.  No changes.  CURRENT MEDICATIONS: Reviewed per [facility]  REVIEW OF SYSTEMS:  GENERAL: no change in appetite, no fatigue, no weight changes, no fever, chills or weakness RESPIRATORY: no cough, SOB, DOE, wheezing, hemoptysis CARDIAC: no chest pain, edema or palpitations GI: no abdominal pain, diarrhea, constipation, heart burn, nausea or vomiting  PHYSICAL EXAMINATION  VS:  T 97.5      P 79   RR 20    BP 114/72   POX %     WT (Lb) 180.8  GENERAL: no acute distress, normal body habitus NECK: supple, trachea midline, no neck masses, no thyroid tenderness, no thyromegaly RESPIRATORY: breathing is even & unlabored, BS CTAB CARDIAC: RRR, no murmur,no extra heart sounds, no edema GI: abdomen soft, normal BS, no masses, no tenderness, no hepatomegaly, no splenomegaly PSYCHIATRIC: the patient is alert &  oriented to person, affect & behavior appropriate  LABS/RADIOLOGY: 7/14 serum iron 41, TIBC 291, percent saturation 14, BUN 19, creatinine 1.68, ferritin 311, hemoglobin 11.3, MCV 76 otherwise CBC normal, creatinine 1.77 otherwise CMP normal 02/2012:  Hemoglobin 10.3, MCV 80, otherwise CBC normal.    Glucose 135, BUN 42, creatinine 1.58, otherwise CMP normal.    01/2012:  Alkaline phosphatase 146, AST 43, albumin 2.9, otherwise liver profile normal.    ASSESSMENT/PLAN:   Hypertension 405.11. BP well controlled  Diabetes mellitus.  Lantus was decreased due to hypoglycemia. Check hemoglobin A1c  GERD.  Well controlled.    PE.  Continue Coumadin.    Chronic kidney disease.  Stable.  Recheck  Check CBC and CMP  CPT CODE: 78938

## 2013-03-20 ENCOUNTER — Non-Acute Institutional Stay (SKILLED_NURSING_FACILITY): Payer: Medicare Other | Admitting: Internal Medicine

## 2013-03-20 DIAGNOSIS — Z7901 Long term (current) use of anticoagulants: Secondary | ICD-10-CM

## 2013-03-20 DIAGNOSIS — I2699 Other pulmonary embolism without acute cor pulmonale: Secondary | ICD-10-CM

## 2013-03-20 NOTE — Progress Notes (Signed)
         PROGRESS NOTE  DATE: 03-20-13  FACILITY:  [facility]  LEVEL OF CARE: SNF (31)  Acute Visit  CHIEF COMPLAINT:  Manage anticoagulation  HISTORY OF PRESENT ILLNESS: I was requested by the staff to assess the patient regarding above problem(s):  PULMONARY EMBOLISM: The pulmonary embolism remains stable. Patient is currently on anticoagulation.  Patient denies chest pain or shortness of breath. No complications reported from the anti-coagulation currently being used.  ANTICOAGULATION: Patient is currently on coumadin and does not report any bleeding. INR on 03-20-13 is 2.6. On 03-13-13 INR was 1.5  PAST MEDICAL HISTORY : Reviewed.  No changes.  CURRENT MEDICATIONS: Reviewed per [facility]  PHYSICAL EXAMINATION  GENERAL: no acute distress, normal body habitus RESPIRATORY: breathing is even & unlabored, BS CTAB CARDIAC: RRR, no murmur,no extra heart sounds, no edema  LABS/RADIOLOGY: See history of present illness  ASSESSMENT/PLAN:  PE-stable V58.61-INR is therapeutic and stable. Continue Coumadin 5.5 mg daily. Check INR in 3 days.  CPT CODE: 85885

## 2013-03-21 ENCOUNTER — Encounter: Payer: Self-pay | Admitting: *Deleted

## 2013-03-27 ENCOUNTER — Non-Acute Institutional Stay (SKILLED_NURSING_FACILITY): Payer: Medicare Other | Admitting: Internal Medicine

## 2013-03-27 DIAGNOSIS — I2699 Other pulmonary embolism without acute cor pulmonale: Secondary | ICD-10-CM

## 2013-03-27 DIAGNOSIS — Z7901 Long term (current) use of anticoagulants: Secondary | ICD-10-CM

## 2013-03-27 DIAGNOSIS — I150 Renovascular hypertension: Secondary | ICD-10-CM

## 2013-03-27 DIAGNOSIS — E1129 Type 2 diabetes mellitus with other diabetic kidney complication: Secondary | ICD-10-CM

## 2013-03-27 DIAGNOSIS — E1165 Type 2 diabetes mellitus with hyperglycemia: Secondary | ICD-10-CM

## 2013-04-01 DIAGNOSIS — E1129 Type 2 diabetes mellitus with other diabetic kidney complication: Secondary | ICD-10-CM | POA: Insufficient documentation

## 2013-04-01 DIAGNOSIS — E1165 Type 2 diabetes mellitus with hyperglycemia: Secondary | ICD-10-CM

## 2013-04-01 NOTE — Progress Notes (Signed)
Patient ID: Noah Torres, male   DOB: 1941/02/12, 73 y.o.   MRN: [id number]         PROGRESS NOTE  DATE: 03/27/2013  FACILITY: Nursing Home Location: [facility]  LEVEL OF CARE: SNF (31)  Routine Visit  CHIEF COMPLAINT:  Manage anticoagulation, PE, hypertension and diabetes mellitus.    HISTORY OF PRESENT ILLNESS:  REASSESSMENT OF ONGOING PROBLEM(S):  PULMONARY EMBOLISM: The pulmonary embolism remains stable. Patient is currently on anticoagulation.  Patient denies chest pain or shortness of breath. No complications reported from the anti-coagulation currently being used.  ANTICOAGULATION: Patient is currently on coumadin and does not report any bleeding. INR on 03-27-13 is 2.7, INR on 03-23-13 is 2.4.  HTN: Pt 's HTN remains stable.  Denies CP, sob, DOE, pedal edema, headaches, dizziness or visual disturbances.  No complications from the medications currently being used.  Last BP : 116/75,113/69, 150/84, 112/58, 178/58, 125/85, 114/72, 118/81.  DM:pt's DM remains stable.  Pt denies polyuria, polydipsia, polyphagia, changes in vision or hypoglycemic episodes.  No complications noted from the medication presently being used.  Last hemoglobin A1c is: 7.6 in 03/2012, in 6/14 was 6.8, in 1-15 hemoglobin A1c 11.6.  PAST MEDICAL HISTORY : Reviewed.  No changes.  CURRENT MEDICATIONS: Reviewed per [facility]  REVIEW OF SYSTEMS:  GENERAL: no change in appetite, no fatigue, no weight changes, no fever, chills or weakness RESPIRATORY: no cough, SOB, DOE, wheezing, hemoptysis CARDIAC: no chest pain, edema or palpitations GI: no abdominal pain, diarrhea, constipation, heart burn, nausea or vomiting  PHYSICAL EXAMINATION  VS:  T 97.1     P 87   RR 22    BP 118/81      WT (Lb) 175  GENERAL: no acute distress, normal body habitus EYES: Normal sclerae, normal conjunctivae, no discharge NECK: supple, trachea midline, no neck masses, no thyroid tenderness, no  thyromegaly LYMPHATICS: No cervical lymphadenopathy, no supraclavicular lymphadenopathy RESPIRATORY: breathing is even & unlabored, BS CTAB CARDIAC: RRR, no murmur,no extra heart sounds, no edema GI: abdomen soft, normal BS, no masses, no tenderness, no hepatomegaly, no splenomegaly PSYCHIATRIC: the patient is alert & oriented to person, affect & behavior appropriate  LABS/RADIOLOGY: 2-15 HDL 35, is 128 otherwise FLP normal 1-15 chloride 110, glucose 162, BUN 32, creatinine 1.9, albumin 3.4 otherwise CMP normal, hemoglobin 11.4, MCV 83.2 otherwise CBC normal 7/14 serum iron 41, TIBC 291, percent saturation 14, BUN 19, creatinine 1.68, ferritin 311, hemoglobin 11.3, MCV 76 otherwise CBC normal, creatinine 1.77 otherwise CMP normal 02/2012:  Hemoglobin 10.3, MCV 80, otherwise CBC normal.    Glucose 135, BUN 42, creatinine 1.58, otherwise CMP normal.    01/2012:  Alkaline phosphatase 146, AST 43, albumin 2.9, otherwise liver profile normal.    ASSESSMENT/PLAN:   Hypertension 405.11. BP well controlled  Diabetes mellitus. Uncontrolled. Lantus was adjusted.  GERD.  Well controlled.    PE.  Continue Coumadin.    Chronic kidney disease.  Creatinine elevated. Monitor.  Anticoagulation-INR is therapeutic and stable. Continue Coumadin 5.5 mg daily. Recheck in one week.  CPT CODE: 46270

## 2013-04-03 ENCOUNTER — Non-Acute Institutional Stay (SKILLED_NURSING_FACILITY): Payer: Medicare Other | Admitting: Internal Medicine

## 2013-04-03 DIAGNOSIS — Z7901 Long term (current) use of anticoagulants: Secondary | ICD-10-CM

## 2013-04-03 DIAGNOSIS — I2699 Other pulmonary embolism without acute cor pulmonale: Secondary | ICD-10-CM

## 2013-04-04 NOTE — Progress Notes (Signed)
Patient ID: Noah Torres, male   DOB: 1941-01-07, 73 y.o.   MRN: [id number]          PROGRESS NOTE  DATE: 02/27/2013    FACILITY:  [facility]  LEVEL OF CARE: SNF (31)  Acute Visit  CHIEF COMPLAINT:  Manage fever.     HISTORY OF PRESENT ILLNESS: I was requested by the staff to assess the patient regarding above problem(s):  Staff report that patient has been having a fever, malaise, and nonproductive cough for two days.  Patient overall is a poor historian.  Staff do not report respiratory insufficiency.  There is no temporal relationship.    PAST MEDICAL HISTORY : Reviewed.  No changes.  CURRENT MEDICATIONS: Reviewed per [facility]  REVIEW OF SYSTEMS:   Unobtainable.  Patient is a poor historian.      PHYSICAL EXAMINATION  VS:  T 102.1       P 60      RR 20      BP 144/89     POX 97% room air       GENERAL: no acute distress, normal body habitus EYES: conjunctivae normal, sclerae normal, normal eye lids NECK: supple, trachea midline, no neck masses, no thyroid tenderness, no thyromegaly LYMPHATICS: no LAN in the neck, no supraclavicular LAN RESPIRATORY: decreased breath sounds    CARDIAC: RRR, no murmur,no extra heart sounds, no edema  ASSESSMENT/PLAN:  Fever.    Cough.     New problems.  Obtain chest x-ray.  Check influenza PCR.  Start Tamiflu 75 mg q.d. for seven days and Mucinex 600 mg b.i.d. for one week.     CPT CODE: 06301     Noah Torres, [place] [phone]

## 2013-04-05 DIAGNOSIS — R05 Cough: Secondary | ICD-10-CM | POA: Insufficient documentation

## 2013-04-05 DIAGNOSIS — R059 Cough, unspecified: Secondary | ICD-10-CM | POA: Insufficient documentation

## 2013-04-05 DIAGNOSIS — R5081 Fever presenting with conditions classified elsewhere: Secondary | ICD-10-CM | POA: Insufficient documentation

## 2013-04-10 NOTE — Progress Notes (Signed)
Patient ID: Noah Torres, male   DOB: [birth year], 73 y.o.   MRN: [id number]          PROGRESS NOTE  DATE: 04/03/2013    FACILITY:  [facility]  LEVEL OF CARE: SNF (31)  Acute Visit  CHIEF COMPLAINT:  Manage anticoagulation.    HISTORY OF PRESENT ILLNESS: I was requested by the staff to assess the patient regarding above problem(s):  PULMONARY EMBOLISM: The pulmonary embolism remains stable. Patient is currently on anticoagulation.  Patient denies chest pain or shortness of breath. No complications reported from the anti-coagulation currently being used.    ANTICOAGULATION: Patient is currently on coumadin and does not report any bleeding. INR today is 2.7.  On 02/09//2015:  INR was 2.7.    PAST MEDICAL HISTORY : Reviewed.  No changes.  CURRENT MEDICATIONS: Reviewed per [facility]  PHYSICAL EXAMINATION  VS:  T 97.5       P 65      RR 18      BP 128/74       WT (Lb) 173.2    GENERAL: no acute distress, normal body habitus RESPIRATORY: breathing is even & unlabored, BS CTAB CARDIAC: RRR, no murmur,no extra heart sounds, no edema  ASSESSMENT/PLAN:  PE.  Stable.    Anticoagulation.  INR is therapeutic and stable.  Continue Coumadin 5.5 mg q.d.  Check PT/INR in two weeks.    CPT CODE: 32202    Gayani Y Dasanayaka, [place] [phone]

## 2013-06-15 ENCOUNTER — Non-Acute Institutional Stay (SKILLED_NURSING_FACILITY): Payer: Medicare Other | Admitting: Internal Medicine

## 2013-06-15 DIAGNOSIS — I150 Renovascular hypertension: Secondary | ICD-10-CM

## 2013-06-15 DIAGNOSIS — E1129 Type 2 diabetes mellitus with other diabetic kidney complication: Secondary | ICD-10-CM

## 2013-06-15 DIAGNOSIS — E1165 Type 2 diabetes mellitus with hyperglycemia: Secondary | ICD-10-CM

## 2013-06-15 DIAGNOSIS — K219 Gastro-esophageal reflux disease without esophagitis: Secondary | ICD-10-CM

## 2013-06-15 DIAGNOSIS — I2699 Other pulmonary embolism without acute cor pulmonale: Secondary | ICD-10-CM

## 2013-06-17 NOTE — Progress Notes (Signed)
Patient ID: Noah Torres, male   DOB: 27-Jun-1940, 73 y.o.   MRN: [id number]          PROGRESS NOTE  DATE: 06/15/2013  FACILITY: Nursing Home Location: [facility]  LEVEL OF CARE: SNF (31)  Routine Visit  CHIEF COMPLAINT:  Manage PE, hypertension and diabetes mellitus.    HISTORY OF PRESENT ILLNESS:  REASSESSMENT OF ONGOING PROBLEM(S):  PULMONARY EMBOLISM: The pulmonary embolism remains stable. Patient is currently on anticoagulation.  Patient denies chest pain or shortness of breath. No complications reported from the anti-coagulation currently being used.  HTN: Pt 's HTN remains stable.  Denies CP, sob, DOE, pedal edema, headaches, dizziness or visual disturbances.  No complications from the medications currently being used.  Last BP : 116/75,113/69, 150/84, 112/58, 178/58, 125/85, 114/72, 118/81, 121/79.  DM:pt's DM remains stable.  Pt denies polyuria, polydipsia, polyphagia, changes in vision or hypoglycemic episodes.  No complications noted from the medication presently being used.  Last hemoglobin A1c is: 7.6 in 03/2012, in 6/14 was 6.8, in 1-15 hemoglobin A1c 11.6, and 2-15 hemoglobin A1c is 11.1  PAST MEDICAL HISTORY : Reviewed.  No changes.  CURRENT MEDICATIONS: Reviewed per [facility]  REVIEW OF SYSTEMS:  GENERAL: no change in appetite, no fatigue, no weight changes, no fever, chills or weakness RESPIRATORY: no cough, SOB, DOE, wheezing, hemoptysis CARDIAC: no chest pain, edema or palpitations GI: no abdominal pain, diarrhea, constipation, heart burn, nausea or vomiting  PHYSICAL EXAMINATION  VS: See vital signs section  GENERAL: no acute distress, normal body habitus EYES: Normal sclerae, normal conjunctivae, no discharge NECK: supple, trachea midline, no neck masses, no thyroid tenderness, no thyromegaly LYMPHATICS: No cervical lymphadenopathy, no supraclavicular lymphadenopathy RESPIRATORY: breathing is even & unlabored, BS CTAB CARDIAC:  RRR, no murmur,no extra heart sounds, no edema GI: abdomen soft, normal BS, no masses, no tenderness, no hepatomegaly, no splenomegaly PSYCHIATRIC: the patient is alert & oriented to person, affect & behavior appropriate  LABS/RADIOLOGY: 2-15 HDL 35, is 128 otherwise FLP normal 1-15 chloride 110, glucose 162, BUN 32, creatinine 1.9, albumin 3.4 otherwise CMP normal, hemoglobin 11.4, MCV 83.2 otherwise CBC normal 7/14 serum iron 41, TIBC 291, percent saturation 14, BUN 19, creatinine 1.68, ferritin 311, hemoglobin 11.3, MCV 76 otherwise CBC normal, creatinine 1.77 otherwise CMP normal 02/2012:  Hemoglobin 10.3, MCV 80, otherwise CBC normal.    Glucose 135, BUN 42, creatinine 1.58, otherwise CMP normal.    01/2012:  Alkaline phosphatase 146, AST 43, albumin 2.9, otherwise liver profile normal.    ASSESSMENT/PLAN:   Hypertension 405.11. BP well controlled  Diabetes mellitus. Uncontrolled. Lantus was adjusted.  GERD.  Well controlled.    PE.  Continue Coumadin.    Chronic kidney disease.  Creatinine elevated. Monitor.  CPT CODE: 79892  Noah Torres Y. Durwin Reges, [place] [phone]

## 2013-07-05 ENCOUNTER — Non-Acute Institutional Stay (SKILLED_NURSING_FACILITY): Payer: Medicare Other | Admitting: Internal Medicine

## 2013-07-05 DIAGNOSIS — K219 Gastro-esophageal reflux disease without esophagitis: Secondary | ICD-10-CM

## 2013-07-05 DIAGNOSIS — I10 Essential (primary) hypertension: Secondary | ICD-10-CM

## 2013-07-05 DIAGNOSIS — I2699 Other pulmonary embolism without acute cor pulmonale: Secondary | ICD-10-CM

## 2013-07-05 DIAGNOSIS — D638 Anemia in other chronic diseases classified elsewhere: Secondary | ICD-10-CM

## 2013-07-05 DIAGNOSIS — Z7901 Long term (current) use of anticoagulants: Secondary | ICD-10-CM

## 2013-07-05 DIAGNOSIS — E119 Type 2 diabetes mellitus without complications: Secondary | ICD-10-CM

## 2013-07-05 NOTE — Progress Notes (Signed)
Patient ID: Noah Torres, male   DOB: 18-May-1940, 73 y.o.   MRN: [id number]   This is a routine visit.  Level of care skilled.  Facility AF.  Chief complaint-medical management of chronic medical conditions including hypertension-history of pulmonary embolism on chronic anticoagulation-GERD-chronic kidney disease-diabetes type 2.  History of present illness.  Patient is a pleasant 73 year old male with the above diagnoses he continues to be quite stable he was initially hospitalized for pulmonary embolism with associated shock he does continue on chronic Coumadin and INR has been therapeutic.  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  He also is a type II diabetic CBGs appear to run largely in the 100s per discussion with nursing-Will update a hemoglobin A1c he is on Lantus as well as NovoLog with meals.  In the hospital he did have acute renal failure requiring dialysis but this has been stable during his stay here he does have a history of chronic renal insufficiency most recent creatinine 1.9 back in January we will update this this appears to be relatively baseline.  Patient has no complaints today he has gained some weight this I suspect his appetite related he had lost some weight earlier this year but appears to have gained this back in his back close to what has previous weight was in November of last year.  Blood pressure appears to be well controlled most recent readings 134/77 125/73-140/80 I do not see consistent elevations. He is on Cobre.  Family medical social history reviewed per history and physical on 01/22/2012.  Medications have been reviewed per MAR.  Review of systems.  Gen. no complaints of fever or chills his weight has been increasing this is thought to be appetite related.  Skin no complaints of itching or rashes.  Head ears eyes nose mouth and  throat-no throat pain no visual changes.  Respiratory does not complaining of any shortness of breath or cough.  Cardiac no chest pain minimal lower extremity edema.  GI does not complaining of any abdominal pain has a good appetite no nausea or vomiting noted.  Muscle skeletal does not complaining of joint pain.  Neurologic no complaints of headache or dizziness does have right-sided weakness which is baseline.  Psych appears grossly alert and oriented pleasant and appropriate.  Physical exam.  He is afebrile pulse 61 respirations 19 blood pressure 134/77.  In general this is a well-nourished elderly male in no distress sitting comfortably in his wheelchair.  His skin is warm and dry.  Oropharynx clear mucous membranes moist.  Eyes sclera and conjunctiva are clear.  Chest is clear to auscultation no labored breathing.  Heart is regular rate and rhythm without murmur gallop or rub is not really have significant edema   abdomen is soft nontender with positive bowel sounds.  M-S-- does ambulate wheelchair with right-sided weakness.    Psych-patient is alert and oriented  To person-- pleasant and appropriate   Labs.  There were 16 2015.  Hemoglobin A1c 11.1.  03/20/2013.  Cholesterol 191 HDL 35 LDL 128 triglycerides 142.  03/09/2013.  Sodium 142 potassium 4 BUN 32 creatinine 1.9.  Liver function tests within normal limits except albumin of 3.4.  WBC 6.0 hemoglobin 11.4 platelets 279.  Assessment and plan.  #1-history of pulmonary embolism on chronic anticoagulation INR has been stable for some time between one-2--update INR is pending currently on 7.5 mg of  Coumadin has been on this for some time.  #2-history hypertension this appears to be stable Coreg.  #3 history diabetes type 2 Lantus was recently increased this appears to have help Will update a hemoglobin A1c.  #4 history of GERD he continues on Photonix this appears to be relatively  asymptomatic.  #5-history of chronic renal disease-Will update a BMP.  #6 past history of anemia-we will update CBC as well.  #7 weight gain-patient apparently is eating quite well he had lost weight earlier this year it is reassuring he is eating better continue to monitor for now area  CPT-99309.

## 2013-07-08 ENCOUNTER — Encounter: Payer: Self-pay | Admitting: Internal Medicine

## 2013-07-19 ENCOUNTER — Encounter: Payer: Self-pay | Admitting: Internal Medicine

## 2013-07-19 ENCOUNTER — Non-Acute Institutional Stay (SKILLED_NURSING_FACILITY): Payer: Medicare Other | Admitting: Internal Medicine

## 2013-07-19 DIAGNOSIS — R42 Dizziness and giddiness: Secondary | ICD-10-CM | POA: Insufficient documentation

## 2013-07-19 DIAGNOSIS — I10 Essential (primary) hypertension: Secondary | ICD-10-CM

## 2013-07-19 NOTE — Progress Notes (Signed)
Patient ID: Noah Torres, male   DOB: 1940-03-14, 73 y.o.   MRN: [id number]   This is an addendum.  Assessment and plan.  #1 dizziness-neurologically patient appears to be intact-at this point will treat empirically with Antivert 25 mg twice a day when necessary and monitor this--- medically he appears stable and at baseline-this was discussed with Dr. Sheppard Coil  [phone]

## 2013-07-19 NOTE — Progress Notes (Signed)
Patient ID: Noah Torres, male   DOB: 25-Jul-1940, 73 y.o.   MRN: [id number]   This is an acute visit.  Level of care skilled.  Facility AF.  Chief complaint-acute visit secondary to occasional dizziness when standing.  History of present illness.  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  He is on chronic Coumadin and INR has been stable for some time.  He also continues to have some right-sided weakness with this.  There is a gross total history of normal pressure hydrocephalus versus global atrophy I did review the MRI done back in 2013 and it suggested global atrophy but could not rule out a communicating hydrocephalus.  He has been quite stable here in the facility ambulating in a wheelchair.  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  Sitting I got 150/92-and standing I got 142/86-he did not really complain of dizziness while standing but did state he felt a bit shaky.  Family medical social history as been reviewed per history and physical on 01/22/2012 progress note of 07/05/2013.  Medications have been reviewed per MAR.  Of note he is on Coreg for hypertension. As well as Coumadin for his history of pulmonary embolism.  Review of systems.  In general he does not complaining of fever chills has a very good appetite.  Skin does not complaining of any rashes or itching.  Oropharynx does not complaining of any trouble swallowing or sore throat.  Eyes does not complaining of any visual changes.  Respiratory no complaints of shortness of breath or cough.--No shortness  of breath when he gets dizzy standing up  Cardiac no chest pain even apparently during the dizzy episodes.   GI does not complaining the abdominal pain has a very good appetite no nausea or vomiting noted.  Muscle skeletal does not complaining of joint pain does have some right-sided weakness and he is quite unsteady whn he stands.  Neurologic has a history of some right-sided weakness but is able to stand with some assistance does not complaining of any headache apparently does complain at times of some dizziness when he is standing.  Physical exam.  Dentures 97.8 pulse 70 respirations 16 blood pressure sitting 148/92-standing 142/86.  In general this is a pleasant elderly male in no distress sitting comfortably in his wheelchair.  The skin is warm and dry  Eyes-pupils appear reactive to light extraocular movements intact --with some hx right homonymous hemianopsiat.  Oropharynx is clear mucous membranes moist tongue has appropriate range of motion and is in midline  Neck-cannot really appreciate any carotid bruits bilaterally  .  Chest is clear to auscultation. There is no labored breathing.  Heart is regular rate and rhythm without murmur gallop or rub.  He has minimal lower extremity edema.  Abdomen is soft nontender with positive bowel sounds.  Musculoskeletal does have some fraility especially with standing with some mild right-sided weakness but this did not appear to be changed from baseline--did not note any deformity.  Neurologic as stated above continued with some right-sided weakness but neurologically baseline from previous exam he does appear to have decent grip strength bilaterally again is able  to stand with assistance-cranial nerves appear grossly intact with some history of right homonymous hemianopsia  Psych as she is oriented to self pleasant and appropriate.  Labs  07/06/2013.  Sodium 138 potassium 3.8 BUN 29 creatinine 2.1-liver function tests within  normal limits except albumin of 3.3.  WBC 4.1 hemoglobin 10.9 platelets 212.  Assessment and plan.  #1-dizziness-neurologically he appears to be intact

## 2013-07-22 NOTE — Progress Notes (Signed)
This encounter was created in error - please disregard.  This encounter was created in error - please disregard.

## 2013-09-13 ENCOUNTER — Encounter: Payer: Self-pay | Admitting: Internal Medicine

## 2013-09-13 ENCOUNTER — Non-Acute Institutional Stay (SKILLED_NURSING_FACILITY): Payer: Medicare Other | Admitting: Internal Medicine

## 2013-09-13 DIAGNOSIS — R569 Unspecified convulsions: Secondary | ICD-10-CM

## 2013-09-13 HISTORY — DX: Unspecified convulsions: R56.9

## 2013-09-13 NOTE — Assessment & Plan Note (Signed)
Today pt had episode in his room witnessed by roommate. He stared, had stiffly extended arms and legs and was unresponsive. When nurse arrived he was unresponsive for maybe 3 minutes than gradually started to come to. Pt will follow a command then fall back to sleep. Pt wearing diaper so don't know if incontinent of urine. PT HAS NO HX OF SEIZURE, that I can find. Someone said he did something like this a year ago but I find no documentation. 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 Will send to ED. Pt is a full code.

## 2013-09-13 NOTE — Progress Notes (Signed)
MRN: [id number] Name: Noah Torres  Sex: male Age: 73 y.o. DOB: 08-15-1940  [place] #: [facility] Facility/Room: 233 Level Of Care: SNF Provider: Inocencio Homes D Emergency Contacts: Extended Emergency Contact Information Primary Emergency Contact: [place],  [place] Home Phone: [phone] Relation: None Secondary Emergency Contact: Jones,[name] Address: [address]          [place], [place] [address] [name] of [place] Phone: [phone] Relation: Other  Code Status: FULL  Allergies: Metformin and related  Chief Complaint  Patient presents with  . Acute Visit    HPI: Patient is 73 y.o. male who had a witnessed seizure in his room today.  Past Medical History  Diagnosis Date  . Diabetes mellitus, type 2   . Hypercholesterolemia   . Former smoker   . Bilateral hearing loss     Wears hearing aids  . Chronic kidney disease   . Hearing loss     uses hearing aids    Past Surgical History  Procedure Laterality Date  . Spine surgery        Medication List       This list is accurate as of: 09/13/13  2:14 PM.  Always use your most recent med list.               carvedilol 12.5 MG tablet  Commonly known as:  COREG  Place 1 tablet (12.5 mg total) into feeding tube 2 (two) times daily with a meal.     insulin aspart 100 UNIT/ML injection  Commonly known as:  novoLOG  Inject 5 Units into the skin 3 (three) times daily before meals. Give 5 units prior to meals for cbg >=175     insulin glargine 100 UNIT/ML injection  Commonly known as:  LANTUS  Inject 35 Units into the skin at bedtime.     insulin regular 100 units/mL injection  Commonly known as:  NOVOLIN R,HUMULIN R  Inject 12 Units into the skin as needed for high blood sugar. Give 12 units SQ as needed for blood sugar over 401.     meclizine 25 MG tablet  Commonly known as:  ANTIVERT  Take 25 mg by mouth 2 (two) times daily.     pantoprazole sodium 40 mg/20 mL Pack  Commonly  known as:  PROTONIX  Place 20 mLs (40 mg total) into feeding tube daily.     warfarin 7.5 MG tablet  Commonly known as:  COUMADIN  Take 6 mg by mouth daily.        Meds ordered this encounter  Medications  . meclizine (ANTIVERT) 25 MG tablet    Sig: Take 25 mg by mouth 2 (two) times daily.    Immunization History  Administered Date(s) Administered  . Influenza-Unspecified 11/29/2012    History  Substance Use Topics  . Smoking status: Former Research scientist (life sciences)  . Smokeless tobacco: Not on file  . Alcohol Use: No    Review of Systems  - UTO;nursing reports pt having a normal day prior to seizure, worked with PT  Filed Vitals:   09/13/13 1357  BP: 155/96  Pulse: 80  Temp: 98 F (36.7 C)  Resp: 20    Physical Exam  GENERAL APPEARANCE: sleepy, min conversant. Appropriately groomed. No acute distress PT IS VERY HOH SKIN: No diaphoresis rash HEENT: Unremarkable RESPIRATORY: Breathing is even, unlabored. Lung sounds are clear   CARDIOVASCULAR: Heart RRR no murmurs, rubs or gallops. No peripheral edema  GASTROINTESTINAL: Abdomen is soft, non-tender, not distended w/ normal bowel sounds.  GENITOURINARY: Bladder non tender, not distended  MUSCULOSKELETAL: No abnormal joints or musculature NEUROLOGIC: Cranial nerves 2-12 grossly intact. Moves all extremities; can now follow a simple command PSYCHIATRIC:  no behavioral issues  Patient Active Problem List   Diagnosis Date Noted  . New onset seizure 09/13/2013  . Dizziness 07/19/2013  . Fever, unspecified 04/05/2013  . Cough 04/05/2013  . Type II or unspecified type diabetes mellitus with renal manifestations, uncontrolled 04/01/2013  . Thrush 03/01/2013  . Anemia of other chronic disease 09/22/2012  . Secondary renovascular hypertension, benign 07/26/2012  . Type II or unspecified type diabetes mellitus with renal manifestations, not stated as uncontrolled(250.40) 07/26/2012  . GERD (gastroesophageal reflux disease) 07/04/2012   . Chronic anticoagulation 07/04/2012  . Anxiety state, unspecified 07/04/2012  . Essential hypertension, benign 07/04/2012  . Type II or unspecified type diabetes mellitus without mention of complication, not stated as uncontrolled 07/04/2012  . Esophageal reflux 07/04/2012  . Other pulmonary embolism and infarction 07/04/2012  . Long term (current) use of anticoagulants 05/09/2012  . HTN (hypertension) 01/21/2012  . DM type 2 (diabetes mellitus, type 2) 01/21/2012  . Urinary retention 01/21/2012  . Encephalopathy 01/09/2012  . Normal pressure hydrocephalus 01/09/2012  . Acute respiratory failure 01/04/2012  . PE (pulmonary thromboembolism) 01/04/2012  . Cardiac arrest 01/04/2012  . Acute renal failure 01/04/2012    CBC    Component Value Date/Time   WBC 5.1 01/21/2012 0720   RBC 3.72* 01/21/2012 0720   HGB 9.7* 01/21/2012 0720   HCT 30.3* 01/21/2012 0720   PLT 468* 01/21/2012 0720   MCV 81.5 01/21/2012 0720    CMP     Component Value Date/Time   NA 139 01/21/2012 0720   K 3.6 01/21/2012 0720   CL 108 01/21/2012 0720   CO2 18* 01/21/2012 0720   GLUCOSE 185* 01/21/2012 0720   BUN 53* 01/21/2012 0720   CREATININE 4.16* 01/21/2012 0720   CALCIUM 8.5 01/21/2012 0720   PROT 7.7 01/15/2012 0440   ALBUMIN 2.1* 01/21/2012 0720   AST 36 01/15/2012 0440   ALT 53 01/15/2012 0440   ALKPHOS 120* 01/15/2012 0440   BILITOT 0.4 01/15/2012 0440   GFRNONAA 13* 01/21/2012 0720   GFRAA 15* 01/21/2012 0720    Assessment and Plan  New onset seizure Today pt had episode in his room witnessed by roommate. He stared, had stiffly extended arms and legs and was unresponsive. When nurse arrived he was unresponsive for maybe 3 minutes than gradually started to come to. Pt will follow a command then fall back to sleep. Pt wearing diaper so don't know if incontinent of urine. PT HAS NO HX OF SEIZURE, that I can find. Someone said he did something like this a year ago but I find no documentation. 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 Will send to ED. Pt is a full code.    Hennie Duos, MD

## 2013-09-20 ENCOUNTER — Encounter: Payer: Self-pay | Admitting: Internal Medicine

## 2013-09-20 ENCOUNTER — Non-Acute Institutional Stay (SKILLED_NURSING_FACILITY): Payer: Medicare Other | Admitting: Internal Medicine

## 2013-09-20 DIAGNOSIS — D638 Anemia in other chronic diseases classified elsewhere: Secondary | ICD-10-CM

## 2013-09-20 DIAGNOSIS — K219 Gastro-esophageal reflux disease without esophagitis: Secondary | ICD-10-CM

## 2013-09-20 DIAGNOSIS — I10 Essential (primary) hypertension: Secondary | ICD-10-CM

## 2013-09-20 DIAGNOSIS — Z7901 Long term (current) use of anticoagulants: Secondary | ICD-10-CM

## 2013-09-20 DIAGNOSIS — G912 (Idiopathic) normal pressure hydrocephalus: Secondary | ICD-10-CM

## 2013-09-20 DIAGNOSIS — R569 Unspecified convulsions: Secondary | ICD-10-CM

## 2013-09-20 DIAGNOSIS — I2699 Other pulmonary embolism without acute cor pulmonale: Secondary | ICD-10-CM

## 2013-09-20 DIAGNOSIS — N179 Acute kidney failure, unspecified: Secondary | ICD-10-CM

## 2013-09-20 NOTE — Assessment & Plan Note (Signed)
7/29  13.3/41.6  MCV 80.9  PLT 171

## 2013-09-20 NOTE — Assessment & Plan Note (Signed)
Continue protonix  

## 2013-09-20 NOTE — Progress Notes (Signed)
MRN: [id number] Name: Noah Torres  Sex: male Age: 73 y.o. DOB: Jul 11, 1940  [place] #: [facility] Facility/Room: 422D Level Of Care: SNF Provider: Inocencio Homes D Emergency Contacts: Extended Emergency Contact Information Primary Emergency Contact: [place],  [address] Home Phone: [phone] Relation: None Secondary Emergency Contact: Jones,[name] Address: [address]          [place], [place] [address] [name] of [place] Phone: [phone] Relation: Other  Code Status:   Allergies: Metformin and related  Chief Complaint  Patient presents with  . nursing home admission    HPI: Patient is 73 y.o. male who had a witnessed seizure at SNF, seen and admitted at [facility] and started on Keppra after seeing neuro.  Past Medical History  Diagnosis Date  . Diabetes mellitus, type 2   . Hypercholesterolemia   . Former smoker   . Bilateral hearing loss     Wears hearing aids  . Chronic kidney disease   . Hearing loss     uses hearing aids  . Seizures 09/13/2013    Past Surgical History  Procedure Laterality Date  . Spine surgery        Medication List       This list is accurate as of: 09/20/13  6:44 PM.  Always use your most recent med list.               carvedilol 12.5 MG tablet  Commonly known as:  COREG  Place 1 tablet (12.5 mg total) into feeding tube 2 (two) times daily with a meal.     insulin aspart 100 UNIT/ML injection  Commonly known as:  novoLOG  Inject 5 Units into the skin 3 (three) times daily before meals. Give 5 units prior to meals for cbg >=175     insulin glargine 100 UNIT/ML injection  Commonly known as:  LANTUS  Inject 35 Units into the skin at bedtime.     insulin regular 100 units/mL injection  Commonly known as:  NOVOLIN R,HUMULIN R  Inject 12 Units into the skin as needed for high blood sugar. Give 12 units SQ as needed for blood sugar over 401.     levETIRAcetam 500 MG tablet  Commonly known as:  KEPPRA  Take  500 mg by mouth 2 (two) times daily.     meclizine 25 MG tablet  Commonly known as:  ANTIVERT  Take 25 mg by mouth 2 (two) times daily.     pantoprazole sodium 40 mg/20 mL Pack  Commonly known as:  PROTONIX  Place 20 mLs (40 mg total) into feeding tube daily.     warfarin 7.5 MG tablet  Commonly known as:  COUMADIN  Take 6 mg by mouth daily.        Meds ordered this encounter  Medications  . levETIRAcetam (KEPPRA) 500 MG tablet    Sig: Take 500 mg by mouth 2 (two) times daily.    Immunization History  Administered Date(s) Administered  . Influenza-Unspecified 11/29/2012    History  Substance Use Topics  . Smoking status: Former Research scientist (life sciences)  . Smokeless tobacco: Not on file  . Alcohol Use: No    Family history is noncontributory    Review of Systems  DATA OBTAINED: from patient; no c/o GENERAL: Feels well no fevers, fatigue, appetite changes SKIN: No itching, rash or wounds EYES: No eye pain, redness, discharge EARS: No earache, tinnitus, change in hearing NOSE: No congestion, drainage or  bleeding  MOUTH/THROAT: No mouth or tooth pain, No sore throat RESPIRATORY: No cough, wheezing, SOB CARDIAC: No chest pain, palpitations, lower extremity edema  GI: No abdominal pain, No N/V/D or constipation, No heartburn or reflux  GU: No dysuria, frequency or urgency, or incontinence  MUSCULOSKELETAL: No unrelieved bone/joint pain NEUROLOGIC: No headache, dizziness or focal weakness PSYCHIATRIC: No overt anxiety or sadness. Sleeps well. No behavior issue.   Filed Vitals:   09/20/13 1827  BP: 141/80  Pulse: 85  Temp: 97.4 F (36.3 C)  Resp: 20    Physical Exam  GENERAL APPEARANCE: Alert, min conversant. Appropriately groomed. No acute distress.  SKIN: No diaphoresis rash HEAD: Normocephalic, atraumatic  EYES: Conjunctiva/lids clear. Pupils round, reactive. EOMs intact.  EARS: External exam WNL, canals clear. Hearing grossly normal.  NOSE: No deformity or discharge.   MOUTH/THROAT: Lips w/o lesions RESPIRATORY: Breathing is even, unlabored. Lung sounds are clear   CARDIOVASCULAR: Heart RRR no murmurs, rubs or gallops. No peripheral edema.  GASTROINTESTINAL: Abdomen is soft, non-tender, not distended w/ normal bowel sounds GENITOURINARY: Bladder non tender, not distended  MUSCULOSKELETAL: No abnormal joints or musculature NEUROLOGIC:  Cranial nerves 2-12 grossly intact. Moves all extremities  PSYCHIATRIC: Mood and affect appropriate to situation, no behavioral issues  Patient Active Problem List   Diagnosis Date Noted  . New onset seizure 09/13/2013  . Dizziness 07/19/2013  . Fever, unspecified 04/05/2013  . Cough 04/05/2013  . Type II or unspecified type diabetes mellitus with renal manifestations, uncontrolled 04/01/2013  . Thrush 03/01/2013  . Anemia of other chronic disease 09/22/2012  . Secondary renovascular hypertension, benign 07/26/2012  . Type II or unspecified type diabetes mellitus with renal manifestations, not stated as uncontrolled(250.40) 07/26/2012  . GERD (gastroesophageal reflux disease) 07/04/2012  . Chronic anticoagulation 07/04/2012  . Anxiety state, unspecified 07/04/2012  . Essential hypertension, benign 07/04/2012  . Type II or unspecified type diabetes mellitus without mention of complication, not stated as uncontrolled 07/04/2012  . Esophageal reflux 07/04/2012  . Other pulmonary embolism and infarction 07/04/2012  . Long term (current) use of anticoagulants 05/09/2012  . HTN (hypertension) 01/21/2012  . DM type 2 (diabetes mellitus, type 2) 01/21/2012  . Urinary retention 01/21/2012  . Encephalopathy 01/09/2012  . Normal pressure hydrocephalus 01/09/2012  . Acute respiratory failure 01/04/2012  . PE (pulmonary thromboembolism) 01/04/2012  . Cardiac arrest 01/04/2012  . Acute renal failure 01/04/2012    CBC    Component Value Date/Time   WBC 5.1 01/21/2012 0720   RBC 3.72* 01/21/2012 0720   HGB 9.7* 01/21/2012  0720   HCT 30.3* 01/21/2012 0720   PLT 468* 01/21/2012 0720   MCV 81.5 01/21/2012 0720    CMP     Component Value Date/Time   NA 139 01/21/2012 0720   K 3.6 01/21/2012 0720   CL 108 01/21/2012 0720   CO2 18* 01/21/2012 0720   GLUCOSE 185* 01/21/2012 0720   BUN 53* 01/21/2012 0720   CREATININE 4.16* 01/21/2012 0720   CALCIUM 8.5 01/21/2012 0720   PROT 7.7 01/15/2012 0440   ALBUMIN 2.1* 01/21/2012 0720   AST 36 01/15/2012 0440   ALT 53 01/15/2012 0440   ALKPHOS 120* 01/15/2012 0440   BILITOT 0.4 01/15/2012 0440   GFRNONAA 13* 01/21/2012 0720   GFRAA 15* 01/21/2012 0720    Assessment and Plan  New onset seizure Eval with CT-milddiffuse cortical atrophy and mild white matter dx;MRI vertricular enlargement, stable c/w NPH;B carotids 1-39% occ;  EEG reviewed by Neuro and  rec for keppra was made  Normal pressure hydrocephalus Appears stable by MRI  Essential hypertension, benign Continue coreg  PE (pulmonary thromboembolism) chronic coumadin as prophylaxis  GERD (gastroesophageal reflux disease) Continue protonix  Acute renal failure 7/29  146, 4.5, 108, 23, 160, 30/2.29, GFR 31  Anemia of other chronic disease 7/29  13.3/41.6  MCV 80.9  PLT 171  Long term (current) use of anticoagulants Chronic coumadin for hx PE    Hennie Duos, MD

## 2013-09-20 NOTE — Assessment & Plan Note (Signed)
7/29  146, 4.5, 108, 23, 160, 30/2.29, GFR 31

## 2013-09-20 NOTE — Assessment & Plan Note (Signed)
Eval with CT-milddiffuse cortical atrophy and mild white matter dx;MRI vertricular enlargement, stable c/w NPH;B carotids 1-39% occ;  EEG reviewed by Neuro and rec for keppra was made

## 2013-09-20 NOTE — Assessment & Plan Note (Signed)
chronic coumadin as prophylaxis

## 2013-09-20 NOTE — Assessment & Plan Note (Signed)
Continue coreg.

## 2013-09-20 NOTE — Assessment & Plan Note (Signed)
Appears stable by MRI

## 2013-09-20 NOTE — Assessment & Plan Note (Signed)
Chronic coumadin for hx PE

## 2013-10-11 ENCOUNTER — Non-Acute Institutional Stay (SKILLED_NURSING_FACILITY): Payer: Medicare Other | Admitting: Internal Medicine

## 2013-10-11 ENCOUNTER — Encounter: Payer: Self-pay | Admitting: Internal Medicine

## 2013-10-11 DIAGNOSIS — R251 Tremor, unspecified: Secondary | ICD-10-CM

## 2013-10-11 DIAGNOSIS — R259 Unspecified abnormal involuntary movements: Secondary | ICD-10-CM

## 2013-10-11 NOTE — Progress Notes (Signed)
MRN: [id number] Name: Noah Torres  Sex: male Age: 73 y.o. DOB: May 13, 1940  [place] #: [facility] Facility/Room: 354 Level Of Care: SNF Provider: Inocencio Homes D Emergency Contacts: Extended Emergency Contact Information Primary Emergency Contact: [place],  [place] Home Phone: [phone] Relation: None Secondary Emergency Contact: Jones,[name] Address: [address]          [place], [place] [address] [name] of [place] Phone: [phone] Relation: Other  Allergies: Metformin and related  Chief Complaint  Patient presents with  . Medical Management of Chronic Issues    HPI: Patient is 73 y.o. male who is reported to have hand tremors and I have been asked to assess.  Past Medical History  Diagnosis Date  . Diabetes mellitus, type 2   . Hypercholesterolemia   . Former smoker   . Bilateral hearing loss     Wears hearing aids  . Chronic kidney disease   . Hearing loss     uses hearing aids  . Seizures 09/13/2013    Past Surgical History  Procedure Laterality Date  . Spine surgery        Medication List       This list is accurate as of: 10/11/13  1:49 PM.  Always use your most recent med list.               carvedilol 12.5 MG tablet  Commonly known as:  COREG  Place 1 tablet (12.5 mg total) into feeding tube 2 (two) times daily with a meal.     insulin aspart 100 UNIT/ML injection  Commonly known as:  novoLOG  Inject 5 Units into the skin 3 (three) times daily before meals. Give 5 units prior to meals for cbg >=175     insulin glargine 100 UNIT/ML injection  Commonly known as:  LANTUS  Inject 35 Units into the skin at bedtime.     insulin regular 100 units/mL injection  Commonly known as:  NOVOLIN R,HUMULIN R  Inject 12 Units into the skin as needed for high blood sugar. Give 12 units SQ as needed for blood sugar over 401.     levETIRAcetam 500 MG tablet  Commonly known as:  KEPPRA  Take 500 mg by mouth 2 (two) times daily.      meclizine 25 MG tablet  Commonly known as:  ANTIVERT  Take 25 mg by mouth 2 (two) times daily.     pantoprazole sodium 40 mg/20 mL Pack  Commonly known as:  PROTONIX  Place 20 mLs (40 mg total) into feeding tube daily.     warfarin 7.5 MG tablet  Commonly known as:  COUMADIN  Take 6 mg by mouth daily.        No orders of the defined types were placed in this encounter.    Immunization History  Administered Date(s) Administered  . Influenza-Unspecified 11/29/2012    History  Substance Use Topics  . Smoking status: Former Research scientist (life sciences)  . Smokeless tobacco: Not on file  . Alcohol Use: No    Review of Systems  DATA OBTAINED: f nurse,rom  GENERAL: Feels well no fevers, fatigue, appetite changes SKIN: No itching, rash HEENT: No complaint RESPIRATORY: No cough, wheezing, SOB CARDIAC: No chest pain, palpitations, lower extremity edema  GI: No abdominal pain, No N/V/D or constipation, No heartburn or reflux  GU: No dysuria, frequency or urgency, or incontinence  MUSCULOSKELETAL: No unrelieved bone/joint pain NEUROLOGIC: No headache, dizziness or focal  weakness; some slight tremor noted PSYCHIATRIC: No overt anxiety or sadness. Sleeps well.   Filed Vitals:   10/11/13 1340  BP: 166/90  Pulse: 59  Temp: 97.6 F (36.4 C)  Resp: 20    Physical Exam  GENERAL APPEARANCE: Alert, min conversant. Appropriately groomed. No acute distress  SKIN: No diaphoresis rash HEENT: Unremarkable RESPIRATORY: Breathing is even, unlabored. Lung sounds are clear   CARDIOVASCULAR: Heart RRR no murmurs, rubs or gallops. No peripheral edema  GASTROINTESTINAL: Abdomen is soft, non-tender, not distended w/ normal bowel sounds.  GENITOURINARY: Bladder non tender, not distended  MUSCULOSKELETAL: No abnormal joints or musculature; no regidity NEUROLOGIC: Cranial nerves 2-12 grossly intact. Moves all extremities; no tremor at rest, neither seen nor felt, and no tremor with intention as  well PSYCHIATRIC: Mood and affect appropriate to situation, no behavioral issues  Patient Active Problem List   Diagnosis Date Noted  . New onset seizure 09/13/2013  . Dizziness 07/19/2013  . Fever, unspecified 04/05/2013  . Cough 04/05/2013  . Type II or unspecified type diabetes mellitus with renal manifestations, uncontrolled 04/01/2013  . Thrush 03/01/2013  . Anemia of other chronic disease 09/22/2012  . Secondary renovascular hypertension, benign 07/26/2012  . Type II or unspecified type diabetes mellitus with renal manifestations, not stated as uncontrolled(250.40) 07/26/2012  . GERD (gastroesophageal reflux disease) 07/04/2012  . Chronic anticoagulation 07/04/2012  . Anxiety state, unspecified 07/04/2012  . Essential hypertension, benign 07/04/2012  . Type II or unspecified type diabetes mellitus without mention of complication, not stated as uncontrolled 07/04/2012  . Esophageal reflux 07/04/2012  . Other pulmonary embolism and infarction 07/04/2012  . Long term (current) use of anticoagulants 05/09/2012  . HTN (hypertension) 01/21/2012  . DM type 2 (diabetes mellitus, type 2) 01/21/2012  . Urinary retention 01/21/2012  . Encephalopathy 01/09/2012  . Normal pressure hydrocephalus 01/09/2012  . Acute respiratory failure 01/04/2012  . PE (pulmonary thromboembolism) 01/04/2012  . Cardiac arrest 01/04/2012  . Acute renal failure 01/04/2012    CBC    Component Value Date/Time   WBC 5.1 01/21/2012 0720   RBC 3.72* 01/21/2012 0720   HGB 9.7* 01/21/2012 0720   HCT 30.3* 01/21/2012 0720   PLT 468* 01/21/2012 0720   MCV 81.5 01/21/2012 0720    CMP     Component Value Date/Time   NA 139 01/21/2012 0720   K 3.6 01/21/2012 0720   CL 108 01/21/2012 0720   CO2 18* 01/21/2012 0720   GLUCOSE 185* 01/21/2012 0720   BUN 53* 01/21/2012 0720   CREATININE 4.16* 01/21/2012 0720   CALCIUM 8.5 01/21/2012 0720   PROT 7.7 01/15/2012 0440   ALBUMIN 2.1* 01/21/2012 0720   AST 36 01/15/2012 0440    ALT 53 01/15/2012 0440   ALKPHOS 120* 01/15/2012 0440   BILITOT 0.4 01/15/2012 0440   GFRNONAA 13* 01/21/2012 0720   GFRAA 15* 01/21/2012 0720    Assessment and Plan  REPORTED HAND TREMOR - pt's chart was reviewed for medications that might cause tremor; pt has several conditions that might cause tremor, most likely age related which is benign or treated with bblocker which pt is already on but pt did not have tremor today, either resting or intention, nor any other sign or sx that was worrisome. Will continue to monitor.  Hennie Duos, MD

## 2013-10-16 ENCOUNTER — Non-Acute Institutional Stay (SKILLED_NURSING_FACILITY): Payer: Medicare Other | Admitting: Internal Medicine

## 2013-10-16 DIAGNOSIS — G912 (Idiopathic) normal pressure hydrocephalus: Secondary | ICD-10-CM

## 2013-10-16 DIAGNOSIS — I2699 Other pulmonary embolism without acute cor pulmonale: Secondary | ICD-10-CM

## 2013-10-16 DIAGNOSIS — R5383 Other fatigue: Secondary | ICD-10-CM

## 2013-10-16 DIAGNOSIS — R5381 Other malaise: Secondary | ICD-10-CM

## 2013-10-16 DIAGNOSIS — R531 Weakness: Secondary | ICD-10-CM

## 2013-10-16 DIAGNOSIS — R4182 Altered mental status, unspecified: Secondary | ICD-10-CM | POA: Insufficient documentation

## 2013-10-16 NOTE — Progress Notes (Signed)
Patient ID: Noah Torres, male   DOB: 1940/12/28, 73 y.o.   MRN: [id number]   this is an acute visit.  For care skilled.  Facility AF.   Chief Complaint   Acute visit secondary to altered mental status-leaning to the left   .     HPI: Patient is a pleasant 73 year old male with a multitude of medical issues including recent new-onset seizure and which required a brief hospitalization also has a history of pulmonary embolism he is on chronic Coumadin also a history of CVA. As well as NPH  Apparently nursing staff and physical therapy have noted increased weakness recently-this apparently occurred after his seizures which he was hospitalized for.  However today he has increased weakness on his left leaning to the left side in his wheelchair-also somewhat altered mental status which appeared to be improving per serial exams  Patient is not complaining of any headache dizziness or chest pain but does state he feels increasingly weak.  He was not really talking much -- nursing staff says this is not unusual-however when I initially evaluated him it looks like he wasn't really talking at all  Signs appear to be stable blood pressure is mildly elevated at 156/88 blood sugar is 250 he is a diabetic.   Marland Kitchen  Past Medical History   Diagnosis  Date   .  Diabetes mellitus, type 2    .  Hypercholesterolemia    .  Former smoker    .  Bilateral hearing loss      Wears hearing aids   .  Chronic kidney disease    .  Hearing loss      uses hearing aids   .  Seizures  09/13/2013    Past Surgical History   Procedure  Laterality  Date   .  Spine surgery        Medication List                     carvedilol 12.5 MG tablet    Commonly known as: COREG    Place 1 tablet (12.5 mg total) into feeding tube 2 (two) times daily with a meal.    insulin aspart 100 UNIT/ML injection    Commonly known as: novoLOG    Inject 5 Units into the skin 3 (three) times daily before meals. Give 5 units  prior to meals for cbg >=175    insulin glargine 100 UNIT/ML injection    Commonly known as: LANTUS    Inject 35 Units into the skin at bedtime.    insulin regular 100 units/mL injection    Commonly known as: NOVOLIN R,HUMULIN R    Inject 12 Units into the skin as needed for high blood sugar. Give 12 units SQ as needed for blood sugar over 401.    levETIRAcetam 500 MG tablet    Commonly known as: KEPPRA    Take 500 mg by mouth 2 (two) times daily.    meclizine 25 MG tablet    Commonly known as: ANTIVERT    Take 25 mg by mouth 2 (two) times daily.    pantoprazole sodium 40 mg/20 mL Pack    Commonly known as: PROTONIX    Place 20 mLs (40 mg total) into feeding tube daily.    warfarin 7.5 MG tablet    Commonly known as: COUMADIN    Take 6 mg by mouth daily.  Immunization History   Administered  Date(s) Administered   .  Influenza-Unspecified  11/29/2012    History   Substance Use Topics   .  Smoking status:  Former Research scientist (life sciences)   .  Smokeless tobacco:  Not on file   .  Alcohol Use:  No   Family history is noncontributory  Review of Systems  DATA OBTAINED: from patient and from nursing-somewhat limited as patient does not speak much;   GENERAL: Feels well no fevers, fatigue  SKIN: No itching, rash or wounds  EYES: No eye pain, redness, discharge  EARS: No earache, tinnitus, change in hearing  NOSE: No congestion, drainage or bleeding  MOUTH/THROAT: No mouth or tooth pain, No sore throat  RESPIRATORY: No cough, wheezing, SOB  CARDIAC: No chest pain, palpitations, lower extremity edema --does have some pedal edema GI: No abdominal pain, No N/V/D or constipation, No heartburn or reflux  GU: No dysuria, frequency or urgency, or incontinence  MUSCULOSKELETAL: No unrelieved bone/joint pain  NEUROLOGIC: No headache, dizziness or focal weakness--history of new onset seizures  PSYCHIATRIC: No overt anxiety or sadness. Sleeps well. No behavior issue.                      Physical Exam  Temperature 98.9 pulse 86 respirations 16 blood pressure 156/88-O2 saturation 98% on room air GENERAL APPEARANCE: Initially when I evaluated patient in wheelchair  appear to be less alert sitting in wheelchair--but was responsive to verbal stimuli---- however his alertness improved wnen he was put in bed SKIN: No diaphoresis rash  HEAD: Normocephalic, atraumatic  EYES: Conjunctiva/lids clear. Pupils round, reactive. EOMs intact.  EARS: r. Hearing grossly normal.  NOSE: No deformity or discharge.  MOUTH/THROAT: Lips w/o lesions  RESPIRATORY: Breathing is even, unlabored. Lung sounds are clear--however he does have poor respiratory effort and at times appeared to have difficulty following verbal commands  CARDIOVASCULAR: Heart RRR no murmurs, rubs or gallops. Has mild pedal edema.  GASTROINTESTINAL: Abdomen is soft, non-tender, not distended w/ normal bowel sounds  GENITOURINARY: Bladder non tender, not distended  MUSCULOSKELETAL: Patient appeared to be somewhat diffusely weak however a bit more on the left versus the right although this was difficult to  tell since patient sometimes had difficulty following verbal commands--he is able to move all 4 extremities with what appear to be fairly significant lower extremity weakness bilaterally  NEUROLOGIC: as noted above possible left sided weakness...altered mental status appears somewhat improved--cranial nerves appear grossly intact extraocular movements intact tongue appears to have full range of motion-  PSYCHIATRIC:  He is alert and responding although somewhat slow- Patient Active Problem List    Diagnosis  Date Noted   .  New onset seizure  09/13/2013   .  Dizziness  07/19/2013   .  Fever, unspecified  04/05/2013   .  Cough  04/05/2013   .  Type II or unspecified type diabetes mellitus with renal manifestations, uncontrolled  04/01/2013   .  Thrush  03/01/2013   .  Anemia of other chronic disease  09/22/2012   .   Secondary renovascular hypertension, benign  07/26/2012   .  Type II or unspecified type diabetes mellitus with renal manifestations, not stated as uncontrolled(250.40)  07/26/2012   .  GERD (gastroesophageal reflux disease)  07/04/2012   .  Chronic anticoagulation  07/04/2012   .  Anxiety state, unspecified  07/04/2012   .  Essential hypertension, benign  07/04/2012   .  Type II or  unspecified type diabetes mellitus without mention of complication, not stated as uncontrolled  07/04/2012   .  Esophageal reflux  07/04/2012   .  Other pulmonary embolism and infarction  07/04/2012   .  Long term (current) use of anticoagulants  05/09/2012   .  HTN (hypertension)  01/21/2012   .  DM type 2 (diabetes mellitus, type 2)  01/21/2012   .  Urinary retention  01/21/2012   .  Encephalopathy  01/09/2012   .  Normal pressure hydrocephalus  01/09/2012   .  Acute respiratory failure  01/04/2012   .  PE (pulmonary thromboembolism)  01/04/2012   .  Cardiac arrest  01/04/2012   .  Acute renal failure  01/04/2012   CBC    Component  Value  Date/Time    WBC  5.1  01/21/2012 0720    RBC  3.72*  01/21/2012 0720    HGB  9.7*  01/21/2012 0720    HCT  30.3*  01/21/2012 0720    PLT  468*  01/21/2012 0720    MCV  81.5  01/21/2012 0720   CMP    Component  Value  Date/Time    NA  139  01/21/2012 0720    K  3.6  01/21/2012 0720    CL  108  01/21/2012 0720    CO2  18*  01/21/2012 0720    GLUCOSE  185*  01/21/2012 0720    BUN  53*  01/21/2012 0720    CREATININE  4.16*  01/21/2012 0720    CALCIUM  8.5  01/21/2012 0720    PROT  7.7  01/15/2012 0440    ALBUMIN  2.1*  01/21/2012 0720    AST  36  01/15/2012 0440    ALT  53  01/15/2012 0440    ALKPHOS  120*  01/15/2012 0440    BILITOT  0.4  01/15/2012 0440    GFRNONAA  13*  01/21/2012 0720    GFRAA  15*  01/21/2012 0720   Assessment and Plan  Altered mental status status post a left-sided weakness-patient with possible multiple etiologies-however according nursing staff he  appears increasingly weak and appeared to have a fairly acute change today with the left-sided weakness although he appears to be doing a bit better now that he is in bed however with his complicated medical history Will send him for evaluation to the ER  New onset seizure  Eval with CT-milddiffuse cortical atrophy and mild white matter dx;MRI vertricular enlargement, stable c/w NPH;B carotids 1-39% occ; EEG reviewed by Neuro and rec for keppra was made during most recent  Normal pressure hydrocephalus  Appeared stable by MRI recently in the hospital Essential hypertension, benign  Continue coreg  PE (pulmonary thromboembolism)  chronic coumadin as prophylaxis   [id number]

## 2013-11-06 ENCOUNTER — Encounter: Payer: Self-pay | Admitting: Internal Medicine

## 2013-11-06 ENCOUNTER — Non-Acute Institutional Stay (SKILLED_NURSING_FACILITY): Payer: Medicare Other | Admitting: Internal Medicine

## 2013-11-06 DIAGNOSIS — I82401 Acute embolism and thrombosis of unspecified deep veins of right lower extremity: Secondary | ICD-10-CM

## 2013-11-06 DIAGNOSIS — M25561 Pain in right knee: Secondary | ICD-10-CM

## 2013-11-06 DIAGNOSIS — R5383 Other fatigue: Secondary | ICD-10-CM

## 2013-11-06 DIAGNOSIS — M25569 Pain in unspecified knee: Secondary | ICD-10-CM

## 2013-11-06 DIAGNOSIS — R531 Weakness: Secondary | ICD-10-CM

## 2013-11-06 DIAGNOSIS — R5381 Other malaise: Secondary | ICD-10-CM

## 2013-11-06 DIAGNOSIS — I82409 Acute embolism and thrombosis of unspecified deep veins of unspecified lower extremity: Secondary | ICD-10-CM

## 2013-11-06 DIAGNOSIS — R569 Unspecified convulsions: Secondary | ICD-10-CM

## 2013-11-06 NOTE — Progress Notes (Signed)
Patient ID: Noah Torres, male   DOB: 01/27/1941, 73 y.o.   MRN: [id number]   this is a-routine visit.  Level of care skilled.  Facility [facility].   Chief Complaint       .   medical management of chronic medical conditions including NPH-renal insufficiency-anemia-hypertension-history of pulmonary embolism-diabetes type 2-question seizure disorder    HPI: Patient is 73 y.o. male with the above medical conditions.  His family is here today in somewhat concerned saying that ever since he was started on Keppra for suspected seizure disorder he appears to be somewhat weaker less interactive less energetic.  His vital signs remained relatively stable.  He does not complaining of any acute issues other than right leg pain-however his roommate also states that he appears to be somewhat weaker not eating as much over the past several weeks.  Patient continues on Coumadin for a history of pulmonary embolism INR today is 2.0.  Also has a recent history of UTI positive for Klebsiella and Citrobacter--he has completed a course of ciprofloxacin.     Past Medical History   Diagnosis  Date   .  Diabetes mellitus, type 2    .  Hypercholesterolemia    .  Former smoker    .  Bilateral hearing loss      Wears hearing aids   .  Chronic kidney disease    .  Hearing loss      uses hearing aids   .  Seizures  09/13/2013    Past Surgical History   Procedure  Laterality  Date   .  Spine surgery        Medication List         This list is accurate as of: 09/20/13 6:44 PM. Always use your most recent med list.            carvedilol 12.5 MG tablet    Commonly known as: COREG    Place 1 tablet (12.5 mg total) into feeding tube 2 (two) times daily with a meal.    insulin aspart 100 UNIT/ML injection    Commonly known as: novoLOG    Inject 5 Units into the skin 3 (three) times daily before meals. Give 5 units prior to meals for cbg >=175    insulin glargine 100 UNIT/ML injection    Commonly  known as: LANTUS    Inject 35 Units into the skin at bedtime.    insulin regular 100 units/mL injection    Commonly known as: NOVOLIN R,HUMULIN R    Inject 12 Units into the skin as needed for high blood sugar. Give 12 units SQ as needed for blood sugar over 401.    levETIRAcetam 500 MG tablet    Commonly known as: KEPPRA    Take 500 mg by mouth 2 (two) times daily.    meclizine 25 MG tablet    Commonly known as: ANTIVERT    Take 25 mg by mouth 2 (two) times daily.    pantoprazole sodium 40 mg/20 mL Pack    Commonly known as: PROTONIX    Place 20 mLs (40 mg total) into feeding tube daily.    warfarin 7.5 MG tablet    Commonly known as: COUMADIN    Take 6 mg by mouth daily.      Meds ordered this encounter   Medications   .  levETIRAcetam (KEPPRA) 500 MG tablet     Sig: Take 500 mg by mouth 2 (two) times daily.  Immunization History   Administered  Date(s) Administered   .  Influenza-Unspecified  11/29/2012    History   Substance Use Topics   .  Smoking status:  Former Research scientist (life sciences)   .  Smokeless tobacco:  Not on file   .  Alcohol Use:  No   Family history is noncontributory  Review of Systems  DATA OBTAINED: from patient; main complaint is right leg pain-family has noted some increased weakness over the past several weeks  GENERAL:  no fevers, has fatigue, appetite changes  SKIN: No itching, rash or wounds  EYES: No eye pain--family has noted some watery discharge from the right eye  EARS: No earache, tinnitus, change in hearing  NOSE: No congestion, drainage or bleeding  MOUTH/THROAT: No mouth or tooth pain, No sore throat  RESPIRATORY: No cough, wheezing, SOB  CARDIAC: No chest pain, palpitations, lower extremity edema  GI: No abdominal pain, No N/V/D or constipation, No heartburn or reflux  GU: No dysuria, frequency or urgency, or incontinence  MUSCULOSKELETAL: No unrelieved bone/joint pain some history of right-sided weakness- NEUROLOGIC: No headache, dizziness : No  overt anxiety or sadness. No behavior issue. Has a history of NPH                    Physical Exam  Temperature 97.2-pulse 79-respirations 19-blood pressure 157/73 GENERAL APPEARANCE: Alert, min conversant. Appropriately groomed. No acute distress.  SKIN: No diaphoresis rash  HEAD: Normocephalic, atraumatic  EYES: Minimal erythema right eye conjunctiva small amount of clear drainage no exudate--- left eye conjunctiva is clear. Pupils round, reactive. EOMs intact.  EARS: . Hearing grossly normal.  NOSE: No deformity or discharge.  MOUTH/THROAT: Lips w/o lesions  RESPIRATORY: Breathing is even, unlabored. Lung sounds are clear--poor respiratory effort  CARDIOVASCULAR: Heart RRR no murmurs, rubs or gallops. No peripheral edema.  GASTROINTESTINAL: Abdomen is soft, non-tender, not distended w/ normal bowel sounds  GENITOURINARY: Bladder non tender, not distended  MUSCULOSKELETAL: No abnormal joints or musculature has some history of mild right-sided weakness-appears to possibly have some mildly increased right lower extremity edema this is more prominent above the knee- ---pedal pulse intact  NEUROLOGIC: Cranial nerves 2-12 grossly intact. Moves all extremities  PSYCHIATRIC: Mood and affect appropriate to situation, no behavioral issues does not verbalize much Patient Active Problem List    Diagnosis  Date Noted   .  New onset seizure  09/13/2013   .  Dizziness  07/19/2013   .  Fever, unspecified  04/05/2013   .  Cough  04/05/2013   .  Type II or unspecified type diabetes mellitus with renal manifestations, uncontrolled  04/01/2013   .  Thrush  03/01/2013   .  Anemia of other chronic disease  09/22/2012   .  Secondary renovascular hypertension, benign  07/26/2012   .  Type II or unspecified type diabetes mellitus with renal manifestations, not stated as uncontrolled(250.40)  07/26/2012   .  GERD (gastroesophageal reflux disease)  07/04/2012   .  Chronic anticoagulation  07/04/2012   .   Anxiety state, unspecified  07/04/2012   .  Essential hypertension, benign  07/04/2012   .  Type II or unspecified type diabetes mellitus without mention of complication, not stated as uncontrolled  07/04/2012   .  Esophageal reflux  07/04/2012   .  Other pulmonary embolism and infarction  07/04/2012   .  Long term (current) use of anticoagulants  05/09/2012   .  HTN (hypertension)  01/21/2012   .  DM type 2 (diabetes mellitus, type 2)  01/21/2012   .  Urinary retention  01/21/2012   .  Encephalopathy  01/09/2012   .  Normal pressure hydrocephalus  01/09/2012   .  Acute respiratory failure  01/04/2012   .  PE (pulmonary thromboembolism)  01/04/2012   .  Cardiac arrest  01/04/2012   .  Acute renal failure  01/04/2012   Labs.  09/13/2013.  WBC 7.3-hemoglobin 13.3-platelets 171.  Sodium 146 potassium 4.5 BUN 30 creatinine 2.29-liver function tests within normal limits.     Component  Value  Date/Time    WBC  5.1  01/21/2012 0720    RBC  3.72*  01/21/2012 0720    HGB  9.7*  01/21/2012 0720    HCT  30.3*  01/21/2012 0720    PLT  468*  01/21/2012 0720    MCV  81.5  01/21/2012 0720   CMP    Component  Value  Date/Time    NA  139  01/21/2012 0720    K  3.6  01/21/2012 0720    CL  108  01/21/2012 0720    CO2  18*  01/21/2012 0720    GLUCOSE  185*  01/21/2012 0720    BUN  53*  01/21/2012 0720    CREATININE  4.16*  01/21/2012 0720    CALCIUM  8.5  01/21/2012 0720    PROT  7.7  01/15/2012 0440    ALBUMIN  2.1*  01/21/2012 0720    AST  36  01/15/2012 0440    ALT  53  01/15/2012 0440    ALKPHOS  120*  01/15/2012 0440    BILITOT  0.4  01/15/2012 0440    GFRNONAA  13*  01/21/2012 0720    GFRAA  15*  01/21/2012 0720   Assessment and Plan  New onset seizure  Eval with CT-milddiffuse cortical atrophy and mild white matter dx;MRI vertricular enlargement, stable c/w NPH;B carotids 1-39% occ; EEG reviewed by Neuro and rec for keppra was made--however family is concerned that Keppra is leading to  increased weakness and behavior changes- this was discussed with Dr. Sheppard Coil via phone and will change Keppra to 250 mg a.m. and then in the evening increased to 750 in the evening-possibly this will help with his daytime issues--will have to be monitored Normal pressure hydrocephalus  Appears stable by MRI done during recent hospitalization Essential hypertension, benign  Continue coreg  PE (pulmonary thromboembolism)  chronic coumadin as prophylaxis  GERD (gastroesophageal reflux disease)  Continue protonix  Acute renal failure  7/29 146, 4.5, 108, 23, 160, 30/2.29, GFR 31 --will update this Anemia of other chronic disease  7/29 13.3/41.6 MCV 80.9 PLT 171 --will update this as well Long term (current) use of anticoagulants  Chronic coumadin for hx PE   Increased weakness-that has been going on for a period of weeks- will order laboratory work including a CBC metabolic panel liver function tests TSH and ammonia level.  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 With clear drainage right eye-will treat with Patanol eyedrops twice a day for a week and monitor  Addendum-of note venous Doppler has been obtained which is concerning possibly for possibly a new DVT in the right leg-- although unclear acute versus chronic however-with concern with the leg pain Will send to the ER for evaluation   [id number]-of note greater than  40 minutes spent assessing patient-discussing family concerns at bedside-reviewing his chart-and coordinating and formulating a plan of care for numerous diagnoses-of note greater than 50% of time spent coordinating plan of care

## 2013-11-09 ENCOUNTER — Non-Acute Institutional Stay (SKILLED_NURSING_FACILITY): Payer: Medicare Other | Admitting: Internal Medicine

## 2013-11-09 ENCOUNTER — Encounter: Payer: Self-pay | Admitting: Internal Medicine

## 2013-11-09 DIAGNOSIS — I82409 Acute embolism and thrombosis of unspecified deep veins of unspecified lower extremity: Secondary | ICD-10-CM | POA: Insufficient documentation

## 2013-11-09 DIAGNOSIS — Z5189 Encounter for other specified aftercare: Secondary | ICD-10-CM

## 2013-11-09 DIAGNOSIS — R569 Unspecified convulsions: Secondary | ICD-10-CM

## 2013-11-09 DIAGNOSIS — W19XXXD Unspecified fall, subsequent encounter: Secondary | ICD-10-CM

## 2013-11-09 DIAGNOSIS — Y92129 Unspecified place in nursing home as the place of occurrence of the external cause: Secondary | ICD-10-CM

## 2013-11-09 DIAGNOSIS — I82401 Acute embolism and thrombosis of unspecified deep veins of right lower extremity: Secondary | ICD-10-CM

## 2013-11-09 DIAGNOSIS — W19XXXA Unspecified fall, initial encounter: Secondary | ICD-10-CM

## 2013-11-09 NOTE — Progress Notes (Signed)
Patient ID: Noah Torres, male   DOB: December 10, 1940, 73 y.o.   MRN: [id number]   this is an acute visit.  For care skilled.  Facility AF.  Chief Complaint   Acute visit status post ER visit for right lower extremity DVT.--Followup fall?     Marland Kitchen    HPI: Patient is a pleasant 73 year old male with a multitude of medical issues including recent new-onset seizure and which required a brief hospitalization also has a history of pulmonary embolism he is on chronic Coumadin also a history of CVA. DVT right leg as well as pulmonary embolism   I saw him earlier this week for complaints of right leg pain as well as apparently some altered mental status since he was started on Keppra.  Venous Doppler of the right leg was ordered which came back showing a nonocclusive DVT partial compression of flow in the common femoral vein and ask SFV distal-apparently there was concern that this was somewhat new compared to previous DVT- -and more pathogenic And he was sent to the ER-he went to [facility] apparently and did come back apparently with no new t orders in that regards--in the hospital INR was 1.73 which was somewhat lower than his INR was in the facility-this was brought up to 2.03 before discharge.  -The DVT could not be distinguished chronic from acute-he did receive again heparin and INR before discharge was up to 2.03-.  It was concluded once again patient will need to be on Coumadin with a therapeutic goal of 2-.3  He has been stable in this regard since his return.  Marland Kitchen   and appears to be at his baseline-apparently this afternoon at some point he did in the fall or helped himself out of his wheelchair and was found sitting on the floor-he does not complaining of any pain denies hitting his head   Vital signs are stable systolic is somewhat elevated in the 170s however this was taken apparently right after his fall has come back down to 141  Currently he has no complaints.  When I saw  him earlier this week his familyr was concerned about him not being as interactive appear to be changed since he was started on Keppra -- at that time plans were to change his Keppra dosing to only 250 mg the morning and then give him a larger dose 750 mg at night we will start that tonight-this was discussed with Dr. Sheppard Coil previously  Of note x-rays taken of his leg on the right earlier in the week did not show any acute process .  Past Medical History   Diagnosis  Date   .  Diabetes mellitus, type 2    .  Hypercholesterolemia    .  Former smoker    .  Bilateral hearing loss      Wears hearing aids   .  Chronic kidney disease    .  Hearing loss      uses hearing aids   .  Seizures  09/13/2013    Past Surgical History   Procedure  Laterality  Date   .  Spine surgery        Medication List                    carvedilol 12.5 MG tablet    Commonly known as: COREG    Place 1 tablet (12.5 mg total) into feeding tube 2 (two) times daily with a meal.    insulin  aspart 100 UNIT/ML injection    Commonly known as: novoLOG    Inject 5 Units into the skin 3 (three) times daily before meals. Give 5 units prior to meals for cbg >=175    insulin glargine 100 UNIT/ML injection    Commonly known as: LANTUS    Inject 35 Units into the skin at bedtime.    insulin regular 100 units/mL injection    Commonly known as: NOVOLIN R,HUMULIN R    Inject 12 Units into the skin as needed for high blood sugar. Give 12 units SQ as needed for blood sugar over 401.    levETIRAcetam 500 MG tablet    Commonly known as: KEPPRA    Take 500 mg by mouth 2 (two) times daily.    meclizine 25 MG tablet    Commonly known as: ANTIVERT    Take 25 mg by mouth 2 (two) times daily.    pantoprazole sodium 40 mg/20 mL Pack    Commonly known as: PROTONIX    Place 20 mLs (40 mg total) into feeding tube daily.     Coumadin 4.5 mg daily                        Immunization History   Administered  Date(s)  Administered   .  Influenza-Unspecified  11/29/2012    History   Substance Use Topics   .  Smoking status:  Former Research scientist (life sciences)   .  Smokeless tobacco:  Not on file   .  Alcohol Use:  No   Family history is noncontributory  Review of Systems  DATA OBTAINED: from patient and from nursing-somewhat limited as patient does not speak much;  GENERAL: Feels well no fevers, fatigue  SKIN: No itching, rash or wounds  EYES: No eye pain, redness, discharge no visual changes  EARS: No earache, tinnitus, change in hearing  NOSE: No congestion, drainage or bleeding  MOUTH/THROAT: No mouth or tooth pain, No sore throat  RESPIRATORY: No cough, wheezing, SOB  CARDIAC: No chest pain, palpitations, lower extremity edema --does have some pedal edema  GI: No abdominal pain, No N/V/D or constipation, No heartburn or reflux  GU: No dysuria, frequency or urgency, or incontinence  MUSCULOSKELETAL: No unrelieved bone/joint pain does not complaining of any pain status post fall denies any hip pain shoulder pain arm pain  NEUROLOGIC: No headache, dizziness or focal weakness--history of new onset seizures  PSYCHIATRIC: No overt anxiety or sadness. Sleeps well. No behavior issue.                   Physical Exam temp 97.8 F. pulse 84-respirations 20-blood pressure 178/78 GENERAL APPEARANCE: Pleasant elderly male in no distress resting comfortably in bed   SKIN: No diaphoresis rash--I do not see any significant bruising or hematomas  HEAD: Normocephalic, atraumatic  EYES: Conjunctiva/lids clear. Pupils round, reactive. EOMs intact.  EARS: r. Hearing grossly normal.  NOSE: No deformity or discharge.  MOUTH/THROAT: Lips w/o lesions tongue is midline mucous membranes moist RESPIRATORY: Breathing is even, unlabored. Lung sounds are clear-   CARDIOVASCULAR: Heart RRR no murmurs, rubs or gallops. Has mild pedal edema.  GASTROINTESTINAL: Abdomen is soft, non-tender, not distended w/ normal bowel  sounds MUSCULOSKELETAL: This appeared to be baseline-he does move all extremities x4--has a history of right-sided weakness although this was difficult to fully ascertain he does have gross strength bilaterally moves his legs bilaterally. Possibly slightly weaker on the right. He is able to  move arms and shoulders without pain I did not note any deformity with passive range of motion-there was no hip pain withmovement and flexion of the hip-no deformity was noted--  Cranial nerves appear grossly intact his tongue is midline with full range of motion-pupils are reactive to light bilaterally and equal  NEUROLOGIC: as noted above I do not see any significant changes from baseline  PSYCHIATRIC: He is alert and responsive--actually a bit more than when I saw him earlier this  week-  Patient Active Problem List    Diagnosis  Date Noted   .  New onset seizure  09/13/2013   .  Dizziness  07/19/2013   .  Fever, unspecified  04/05/2013   .  Cough  04/05/2013   .  Type II or unspecified type diabetes mellitus with renal manifestations, uncontrolled  04/01/2013   .  Thrush  03/01/2013   .  Anemia of other chronic disease  09/22/2012   .  Secondary renovascular hypertension, benign  07/26/2012   .  Type II or unspecified type diabetes mellitus with renal manifestations, not stated as uncontrolled(250.40)  07/26/2012   .  GERD (gastroesophageal reflux disease)  07/04/2012   .  Chronic anticoagulation  07/04/2012   .  Anxiety state, unspecified  07/04/2012   .  Essential hypertension, benign  07/04/2012   .  Type II or unspecified type diabetes mellitus without mention of complication, not stated as uncontrolled  07/04/2012   .  Esophageal reflux  07/04/2012   .  Other pulmonary embolism and infarction  07/04/2012   .  Long term (current) use of anticoagulants  05/09/2012   .  HTN (hypertension)  01/21/2012   .  DM type 2 (diabetes mellitus, type 2)  01/21/2012   .  Urinary retention  01/21/2012   .   Encephalopathy  01/09/2012   .  Normal pressure hydrocephalus  01/09/2012   .  Acute respiratory failure  01/04/2012   .  PE (pulmonary thromboembolism)  01/04/2012   .  Cardiac arrest  01/04/2012   .  Acute renal failure  01/04/2012   Labs.  11/07/2013.  INR 2.03.  Sodium 145 potassium 4.3 CO2 22 BUN 26 creatinine 1.9-30  EPC 6.0 hemoglobin 11.5 platelets 136.   09/13/2013.  CBC 7.3-hemoglobin 13.3 platelets 171.  Sodium 146 potassium 4.5 BUN 30 creatinine 2.29-liver function tests within normal limits    Component  Value  Date/Time    WBC  5.1  01/21/2012 0720    RBC  3.72*  01/21/2012 0720    HGB  9.7*  01/21/2012 0720    HCT  30.3*  01/21/2012 0720    PLT  468*  01/21/2012 0720    MCV  81.5  01/21/2012 0720   CMP    Component  Value  Date/Time    NA  139  01/21/2012 0720    K  3.6  01/21/2012 0720    CL  108  01/21/2012 0720    CO2  18*  01/21/2012 0720    GLUCOSE  185*  01/21/2012 0720    BUN  53*  01/21/2012 0720    CREATININE  4.16*  01/21/2012 0720    CALCIUM  8.5  01/21/2012 0720    PROT  7.7  01/15/2012 0440    ALBUMIN  2.1*  01/21/2012 0720    AST  36  01/15/2012 0440    ALT  53  01/15/2012 0440    ALKPHOS  120*  01/15/2012  0440    BILITOT  0.4  01/15/2012 0440    GFRNONAA  13*  01/21/2012 0720    GFRAA  15*  01/21/2012 0720   Assessment and plan.  Right leg DVT-again he did have a visit to the ER with no new orders in this regard -- he continues on Coumadin this appears to be relatively stable at this point continue to monitor INR is therapeutic at 2.0 there are orders to recheck this first laboratory date next week  History of fall-?-This appears to be stable he did not complain of any pain with examination of his extremities and hip-neurologically appears to be at baseline again facility to monitor per fall protocol with vital signs and neuro checks.     New onset seizure  Eval with CT-milddiffuse cortical atrophy and mild white matter dx;MRI vertricular  enlargement, stable c/w NPH;B carotids 1-39% occ; EEG reviewed by Neuro and rec for keppra was made   Family noted concern that patient wasn't quite himself since [place] was started---will change the dosing slightly to 250 mg in the morning and 750 mg q. at bedtime-he will stay at baseline the thousand milligrams a day and this was discussed with Dr. Sheppard Coil via phonepreviously   Laboratory the ER was apparently baseline from what I can see-but will order liver function tests a TSH and ammonia level as well as hemoglobin A1c for updated values Normal pressure hydrocephalus  Appeared stable by MRI recently in the hospital  Essential hypertension, benign  Continue coreg--I suspect elevated systolic to be due to excitement after the fall-Will monitor this he is receiving neuro checks and vital signs per fall protocol--- I do note per updated vital signs blood pressure is now 141/71  PE (pulmonary thromboembolism)  chronic coumadin as prophylaxis --INR yesterday was 2.0 orders to recheck this on Monday currently on 4.5 mg a day  [id number]

## 2013-11-12 DIAGNOSIS — M25569 Pain in unspecified knee: Secondary | ICD-10-CM | POA: Insufficient documentation

## 2013-12-11 ENCOUNTER — Encounter: Payer: Self-pay | Admitting: Internal Medicine

## 2013-12-11 ENCOUNTER — Non-Acute Institutional Stay (SKILLED_NURSING_FACILITY): Payer: Medicare Other | Admitting: Internal Medicine

## 2013-12-11 DIAGNOSIS — I2699 Other pulmonary embolism without acute cor pulmonale: Secondary | ICD-10-CM

## 2013-12-11 DIAGNOSIS — G912 (Idiopathic) normal pressure hydrocephalus: Secondary | ICD-10-CM

## 2013-12-11 DIAGNOSIS — R569 Unspecified convulsions: Secondary | ICD-10-CM

## 2013-12-11 DIAGNOSIS — Z7901 Long term (current) use of anticoagulants: Secondary | ICD-10-CM

## 2013-12-11 DIAGNOSIS — I10 Essential (primary) hypertension: Secondary | ICD-10-CM

## 2013-12-11 DIAGNOSIS — N3289 Other specified disorders of bladder: Secondary | ICD-10-CM

## 2013-12-11 DIAGNOSIS — I82409 Acute embolism and thrombosis of unspecified deep veins of unspecified lower extremity: Secondary | ICD-10-CM

## 2013-12-11 NOTE — Progress Notes (Signed)
Patient ID: Noah Torres, male   DOB: 09/09/1940, 73 y.o.   MRN: [id number]   this is an acute visit.  For care skilled.  Facility [facility].  Chief Complaint   Acute visit status post ER visit    .    HPI: Patient is a pleasant 73 year old male with a multitude of medical issues including recent new-onset seizure and which required a brief hospitalization also has a history of pulmonary embolism he is on chronic Coumadin also a history of CVA. DVT right leg as well as pulmonary embolism   Apparently he went to the ER over the weekend complaining of discomfort-workup there apparently did not show an infectious etiology-apparently a urine culture was done but there was not a suspicion of a urinary tract infection according to his daughter.  Apparently an MRI was done which showed a mass in the bladder with suggestion for urology follow-up.     Currently he has no complaints--his vital signs appear to be stable.    .  Past Medical History   Diagnosis  Date   .  Diabetes mellitus, type 2    .  Hypercholesterolemia    .  Former smoker    .  Bilateral hearing loss      Wears hearing aids   .  Chronic kidney disease    .  Hearing loss      uses hearing aids   .  Seizures  09/13/2013    Past Surgical History   Procedure  Laterality  Date   .  Spine surgery        Medication List                    carvedilol 12.5 MG tablet    Commonly known as: COREG    Place 1 tablet (12.5 mg total) into feeding tube 2 (two) times daily with a meal.    insulin aspart 100 UNIT/ML injection    Commonly known as: novoLOG    Inject 5 Units into the skin 3 (three) times daily before meals. Give 5 units prior to meals for cbg >=175    insulin glargine 100 UNIT/ML injection    Commonly known as: LANTUS    Inject 35 Units into the skin at bedtime.    insulin regular 100 units/mL injection    Commonly known as: NOVOLIN  R,HUMULIN R    Inject 12 Units into the skin as needed for high blood sugar. Give 12 units SQ as needed for blood sugar over 401.    levETIRAcetam 500 MG tablet    Commonly known as: KEPPRA    Take 500 mg by mouth 2 (two) times daily.    meclizine 25 MG tablet    Commonly known as: ANTIVERT    Take 25 mg by mouth 2 (two) times daily.    pantoprazole sodium 40 mg/20 mL Pack    Commonly known as: PROTONIX    Place 20 mLs (40 mg total) into feeding tube daily.     Coumadin 4.5 mg daily                        Immunization History   Administered  Date(s) Administered   .  Influenza-Unspecified  11/29/2012    History   Substance Use Topics   .  Smoking status:  Former Research scientist (life sciences)   .  Smokeless tobacco:  Not on file   .  Alcohol Use:  No  Family history is noncontributory  Review of Systems  DATA OBTAINED: from patient and from nursing-somewhat limited as patient does not speak much;  GENERAL: Feels well no fevers, fatigue  SKIN: No itching, rash or wounds  EYES: No eye pain, redness, discharge no visual changes  EARS: No earache, tinnitus, change in hearing  NOSE: No congestion, drainage or bleeding  MOUTH/THROAT: No mouth or tooth pain, No sore throat  RESPIRATORY: No cough, wheezing, SOB  CARDIAC: No chest pain, palpitations, lower extremity edema --does have some pedal edema  GI: No abdominal pain, No N/V/D or constipation, No heartburn or reflux  GU: --apparently workup in the hospital was negative for UTI but did show a bladder mass  MUSCULOSKELETAL: No unrelieved bone/joint pain does not complaining of any pain status post fall denies any hip pain shoulder pain arm pain  NEUROLOGIC: No headache, dizziness or focal weakness--history of new onset seizures  PSYCHIATRIC: No overt anxiety or sadness. Sleeps well. No behavior issue.                   Physical Exam temperature 98.0 pulse 77  respirations 18 blood pressure 140/80 there is some variability here. Systolic covers around 759 baseline GENERAL APPEARANCE: Pleasant elderly male in no distress resting comfortably in bed  SKIN: No diaphoresis rash--I do not see any significant bruising or hematomas  HEAD: Normocephalic, atraumatic  EYES: Conjunctiva/lids clear. Pupils round, reactive. EOMs intact.  EARS: r. Hearing grossly normal.  NOSE: No deformity or discharge.  MOUTH/THROAT: Lips w/o lesions tongue is midline mucous membranes moist RESPIRATORY: Breathing is even, unlabored. Lung sounds are clear-  CARDIOVASCULAR: Heart RRR no murmurs, rubs or gallops. Has mild pedal edema.  GASTROINTESTINAL: Abdomen is soft, non-tender, not distended w/ normal bowel sounds GU-cannot appreciate any discharge from the penis there is no scrotal edema or pain--could not really appreciate significant suprapubic tenderness MUSCULOSKELETAL: This appeared to be baseline-he does move all extremities x4--has a history of right-sided weakness although this was difficult to fully ascertain he does have gross strength bilaterally moves his legs bilaterally. Possibly slightly weaker on the right. He is able to move arms and shoulders without pain I did not note any deformity with passive range of motion-there was no hip pain withmovement and flexion of the hip-no deformity was noted--  Cranial nerves appear grossly intact his tongue is midline with full range of motion-pupils are reactive to light bilaterally and equal  NEUROLOGIC:  do not see any significant changes from baselinehe has right-sided weakness--he does not talk much  PSYCHIATRIC: He is alert and responsive--a-  Patient Active Problem List    Diagnosis  Date Noted   .  New onset seizure  09/13/2013   .  Dizziness  07/19/2013   .  Fever, unspecified  04/05/2013   .  Cough  04/05/2013   .  Type II or unspecified type diabetes mellitus with renal  manifestations, uncontrolled  04/01/2013   .  Thrush  03/01/2013   .  Anemia of other chronic disease  09/22/2012   .  Secondary renovascular hypertension, benign  07/26/2012   .  Type II or unspecified type diabetes mellitus with renal manifestations, not stated as uncontrolled(250.40)  07/26/2012   .  GERD (gastroesophageal reflux disease)  07/04/2012   .  Chronic anticoagulation  07/04/2012   .  Anxiety state, unspecified  07/04/2012   .  Essential hypertension, benign  07/04/2012   .  Type II or unspecified type diabetes mellitus  without mention of complication, not stated as uncontrolled  07/04/2012   .  Esophageal reflux  07/04/2012   .  Other pulmonary embolism and infarction  07/04/2012   .  Long term (current) use of anticoagulants  05/09/2012   .  HTN (hypertension)  01/21/2012   .  DM type 2 (diabetes mellitus, type 2)  01/21/2012   .  Urinary retention  01/21/2012   .  Encephalopathy  01/09/2012   .  Normal pressure hydrocephalus  01/09/2012   .  Acute respiratory failure  01/04/2012   .  PE (pulmonary thromboembolism)  01/04/2012   .  Cardiac arrest  01/04/2012   .  Acute renal failure  01/04/2012   Labs.  12/11/2013. INR1.9.    11/13/2013.  Hemoglobin A1c 8.5.  Marland Kitchen  11/07/2013.  INR 2.03.  Sodium 145 potassium 4.3 CO2 22 BUN 26 creatinine 1.9-30  EPC 6.0 hemoglobin 11.5 platelets 136.   09/13/2013.  CBC 7.3-hemoglobin 13.3 platelets 171.  Sodium 146 potassium 4.5 BUN 30 creatinine 2.29-liver function tests within normal limits    Component  Value  Date/Time    WBC  5.1  01/21/2012 0720    RBC  3.72*  01/21/2012 0720    HGB  9.7*  01/21/2012 0720    HCT  30.3*  01/21/2012 0720    PLT  468*  01/21/2012 0720    MCV  81.5  01/21/2012 0720   CMP    Component  Value  Date/Time    NA  139  01/21/2012 0720    K  3.6  01/21/2012 0720    CL  108   01/21/2012 0720    CO2  18*  01/21/2012 0720    GLUCOSE  185*  01/21/2012 0720    BUN  53*  01/21/2012 0720    CREATININE  4.16*  01/21/2012 0720    CALCIUM  8.5  01/21/2012 0720    PROT  7.7  01/15/2012 0440    ALBUMIN  2.1*  01/21/2012 0720    AST  36  01/15/2012 0440    ALT  53  01/15/2012 0440    ALKPHOS  120*  01/15/2012 0440    BILITOT  0.4  01/15/2012 0440    GFRNONAA  13*  01/21/2012 0720    GFRAA  15*  01/21/2012 0720   Assessment and plan.  Right leg DVT-continues on Coumadin INR is borderline therapeutic-medication adjustments have been made with repeat INR ordered   History of fall-?-This appears to be stable he did not complain of any pain with examination of his extremities and hip-neurologically appears to be at baseline again facility to monitor per fall protocol with vital signs and neuro checks.    New onset seizure  Eval with CT-milddiffuse cortical atrophy and mild white matter dx;MRI vertricular enlargement, stable c/w NPH;B carotids 1-39% occ; EEG reviewed by Neuro and rec for keppra was made --his daughter would like to talk to Dr. Sheppard Coil about this-when she is in the facility later this week      Normal pressure hydrocephalus  Appeared stable by MRI recently in the hospital  Essential hypertension, benign  Continue coreg-if systolic consistently elevated consider additional medication-   PE (pulmonary thromboembolism)  chronic coumadin as prophylaxis -updated INR is pending  Status post ER visit with discovery of bladder mass-will write for a urology consult-clinically he appears to be stable-I did discuss patient's status with his daughter via phone and she tells me ER workup did not  show an infection would like to obtain the paperwork from ER visit for follow-up  also will order a CBC and metabolic panel as well as TSH for updated values   [id number]--of note greater than 25 minutes spent   assessing patient-discussing his status with nursing staff as well as with his daughter via phone-and coordinating plan of care-note  greater than 50% of time spent coordinating plan of care

## 2014-01-02 ENCOUNTER — Encounter: Payer: Self-pay | Admitting: Internal Medicine

## 2014-01-02 ENCOUNTER — Non-Acute Institutional Stay (SKILLED_NURSING_FACILITY): Payer: Medicare Other | Admitting: Internal Medicine

## 2014-01-02 DIAGNOSIS — E1121 Type 2 diabetes mellitus with diabetic nephropathy: Secondary | ICD-10-CM

## 2014-01-02 DIAGNOSIS — R569 Unspecified convulsions: Secondary | ICD-10-CM

## 2014-01-02 DIAGNOSIS — I10 Essential (primary) hypertension: Secondary | ICD-10-CM

## 2014-01-02 DIAGNOSIS — I82409 Acute embolism and thrombosis of unspecified deep veins of unspecified lower extremity: Secondary | ICD-10-CM

## 2014-01-02 DIAGNOSIS — G912 (Idiopathic) normal pressure hydrocephalus: Secondary | ICD-10-CM

## 2014-01-02 DIAGNOSIS — N3289 Other specified disorders of bladder: Secondary | ICD-10-CM

## 2014-01-02 DIAGNOSIS — I2699 Other pulmonary embolism without acute cor pulmonale: Secondary | ICD-10-CM

## 2014-01-02 NOTE — Progress Notes (Signed)
Patient ID: Noah Torres, male   DOB: 1940/07/04, 73 y.o.   MRN: [id number]   This is an acute-routine visit.  Level care skilled.  [facility].  Chief complaint acute visit follow-up renal insufficiency-bladder mass. Medical management of chronic issues including history of DVTpulmonary embolism on chronic Coumadindiabetes type 2-history seizures?-Normal pressure hydrocephalus-hypertension.  History of present illness.  Patient is a pleasant 73 year old male with a multitude of medical issues as noted above.  Earlier this year he did have a brief hospitalization for what was thought to be a new onset seizure and he is on Keppra twice a day there've been no further episodes apparently.  He also was sent to the ER complaining of acute right leg pain which came back showing a nonocclusive DVT of the common femoral vein and SF the distal--this was thoughtto possibly be chronic and he continues on Coumadin INR recently has been therapeutic.  He is not complaining of leg pain today.  Patient also was most recently sent to the ER  Complaining of significant discomfort-workup there was fairly nondiagnostic did not show an infectious etiology-however MRI did show a mass of the bladder in the left posterior bladder area worrisome for primary bladder neoplasm-urology consult is pending apparently that will be completed this week.  He is not really complaining of dysuria today or any suprapubic discomfort.  He has a history of chronic renal insufficiency as well update his labs shows creatinine is 2.52 which is slightly higher than his baseline which runs it appears in the low twos UN is 25 sodium is 147 which actually somewhat improved from the previous lab when it was 151.  According in nursing staff he is eating and drinking fairly well.  Patient also has a history of hypertension blood pressures appear to be somewhat variable most recentlylisted 134/82  Manually today and got  158/92.  I also see 159/91 previously.  Marland Kitchen  He continues on Coreg 12.5 mg twice a day.  Patient also is a type II diabetic is on Lantus 35 units a day as well as Humalog 5 units subcutaneous for CBG greater than 175- Sugars appear to be variable most recent a.m. Sugars to 14-70 9-1 55-2 52-100.  Later in the day variable from 90-2 39.  At 4:30 run from the lower 100s to mid 200s it appears  Recent  Hemoglobin A1c in late September was 8.5 which actually is improvement it was 11.6 back in January  Family medical social history has been reviewed her previous note on 11/09/2013.  Medications have been reviewed per MAR.  Review of systems.  In general no complaints of fever or chills.  Respiratory does not complain of shortness breath or cough.  Cardiac no chest pain or significant lower extremity edema.  GI does not complain of abdominal discomfort nausea or vomiting diarrhea constipation.  Muscle skeletal is not complaining of joint pain.  Neurologic does not complain of headache or dizziness he does have a history of possible seizures.  Psychiatric no overt anxiety or sadness.  Physical exam.  Temperature is 97.9 pulse 68 respirations 20 blood pressure taken manually 158/92.  In general this is a pleasant elderly male in no distress resting comfortably in bed.  His skin is warm and dry.  Head is normocephalic atraumatic.  Oropharynx clear mucous membranes appear fairly moist.  Chest is clear to auscultation no labored breathing.  Heart is regular rate and rhythm murmur gallop or rub he has minimal lower extremity edema.  GI abdomen soft nontender positive bowel sounds. Muscle skeletal moves all extremities 4 with some mild right-sided weakness although this appears to be baseline-I do not see any deformities.  Neurologic cranial nerves grossly intact tongue midline again possibly some mild right-sided weakness compared to the left.  Psych he is alert and  responsive pleasant and appropriate although appears to have some mild confusion at baseline.  Labs.  01/01/2014.  Sodium 147 potassium 4.2 BUN 25 and 2.52.  12/12/2013   sodium 146 potassium 3.9 BUN 31 creatinine 2.1.  WBC 4.9 hemoglobin 12.5 platelets 204  12/09/2013.  Sodium 141 potassium 4.9 BUN 35 creatinine 2.22.  Liver function tests within normal limits.   Assessment and plan.  #1-history new-onset seizure-there've been no recurrences he is on Keppra at this point will monitor.  I do note a.m. Dose was reduced secondary to family concerns about mental status changes in patient.  #2 normal pressure hydrocephalus-apparently this is been stable per recent MRI.  #3 hypertension appears to have somewhat elevated systolics with some frequency-will start low dose Norvasc 2.5 mg and monitor.  #4 history of pulmonary embolism-she is on chronic Coumadin most recent INR was therapeutic update INR is pending.  History of bladder mass-I did review the hospital records as noted above-a urology consult is pending clinically he appears stable in this regards.  History right leg DVT again he is on chronic Coumadin-he is not complaining of leg discomfort today.  History of diabetes-blood sugars are somewhat variable but hemoglobin A1c shows improvement at this point will monitor he appears to have some variability here.  History of chronic kidney disease baseline creatinine appears to be in the low twos-most recently 2.5 to will update this in a week to see if this is a true trend up or possibly a variation-sodium also mildly elevated at 147 although this has improved encourage fluid intake.  CPT-is 99310-of note greater than 35 minutes spent assessing patient-discussing his status with nursing staff-reviewing hospital record and charts-and coordinating in formulating a plan of care for numerous diagnoses-of note greater than 50% of time spent coordinating plan of  care   .   Marland Kitchen

## 2014-01-08 ENCOUNTER — Encounter: Payer: Self-pay | Admitting: Internal Medicine

## 2014-01-08 ENCOUNTER — Non-Acute Institutional Stay (SKILLED_NURSING_FACILITY): Payer: Medicare Other | Admitting: Internal Medicine

## 2014-01-08 DIAGNOSIS — I82409 Acute embolism and thrombosis of unspecified deep veins of unspecified lower extremity: Secondary | ICD-10-CM

## 2014-01-08 DIAGNOSIS — R569 Unspecified convulsions: Secondary | ICD-10-CM

## 2014-01-08 DIAGNOSIS — Z7901 Long term (current) use of anticoagulants: Secondary | ICD-10-CM

## 2014-01-08 NOTE — Progress Notes (Signed)
Patient ID: Noah Torres, male   DOB: [birth year], 73 y.o.   MRN: [id number] Patient ID: Noah Torres, male   DOB: [birth year], 73 y.o.   MRN: [id number]   This is an acute- visit.  Level care skilled.  [facility].  Chief complaint acute visit ? Follow-up seizure yesterday .  History of present illness.  Patient is a pleasant 73 year old male  .  Earlier this year he did have a brief hospitalization for what was thought to be a new onset seizure and he is on Keppra twice a day there've been no further episodes apparently.--However yesterday nursing staff left a  note about a short episode of seizure activity that quickly resolved-speaking with nursing today apparently the report was that he had what appeared to be an absence seizure with altered mental status-staring off into space short amount of time and then return to his baseline   Per review of ER notes it appears he had a generalized seizure possibly when he went to the ER although information is somewhat sketchy.  However per M.D. evaluation at that time the suspicion was strong enough to start patient on Keppra  At that time he did have a CT and MRI done of the head that did not show any evidence of an acute infarct-he was giving a loading dose of Keppra in the ER and was admitted and continued on Keppra twice a day.  EEG done at the hospital at that time was reviewed by neurology and recommended continuing the [place].  His vital signs continued to be stable today he is at his baseline is alert lying in bed certainly responsive-physical exam was baseline with what I saw last week during his routine visit  I note today he does have an INR of 2.4 he is on chronic Coumadin with a history of pulmonary embolism right leg DVT-this appears to have stabilized recently.  .   .    Family medical social history has been reviewedpher previous note on 11/09/2013. and 01/02/2014  Medications have been reviewed per  MAR.  Review of systems.  In general no complaints of fever or chills.  Respiratory does not complain of shortness breath or cough.  Cardiac no chest pain or significant lower extremity edema.  GI does not complain of abdominal discomfort nausea or vomiting diarrhea constipation.  Muscle skeletal is not complaining of joint pain.  Neurologic does not complain of headache or dizziness he does have a history of possible seizures as noted above.  Marland Kitchen  Physical exam.  He is afebrile pulse 72 respirations 18 blood pressure 136/60-138/68-130/68 recently-right after seizure blood pressure was 98/65 I suspect this was taken by machine  In general this is a pleasant elderly male in no distress resting comfortably in bed.  His skin is warm and dry.  Head is normocephalic atraumatic.  Oropharynx clear mucous membranes appear  moist.  Chest is clear to auscultation no labored breathing.  Heart is regular rate and rhythm murmur gallop or rub he has minimal lower extremity edema.  GI abdomen soft nontender positive bowel sounds. Muscle skeletal moves all extremities 4 with some mild right-sided weakness although this appears to be baseline-I do not see any deformities.  Neurologic cranial nerves grossly intact tongue midline again possibly some mild right-sided weakness compared to the left--but this is not new.  Psych he is alert and responsive pleasant and appropriate although appears to have some mild confusion at baseline.  Labs.  01/01/2014.  Sodium 147 potassium  4.2 BUN 25 and 2.52.  12/12/2013   sodium 146 potassium 3.9 BUN 31 creatinine 2.1.  WBC 4.9 hemoglobin 12.5 platelets 204  12/09/2013.  Sodium 141 potassium 4.9 BUN 35 creatinine 2.22.  Liver function tests within normal limits.   Assessment and plan.  #1-history new-onset seizure--the history here is somewhat sketchy-it appears any possible seizure yesterday was more of an absence seizure-it appears the  original seizure was generalized-this was discussed with Dr. Sheppard Coil via phone--and at this point will monitor-patient appears to be at his baseline today there has been no reoccurrence of what happened yesterday-again this will have to be watched closely will order vital signs with neuro checks every shift for now-also will update labs including a CBC and a metabolic panel-and TSH Provider be notified of any mental status changes or seizure type activity.  I do note in the past   a.m.  Keppra  Dose was reduced secondary to family concerns about mental status changes in patient.--And p.m. dose was increased at that time.     .  #2 history of pulmonary embolism-he is on chronic Coumadin most recent INR today showed stability with INR in mid 2' s  update INR is pending  [id number].      Marland Kitchen   Marland Kitchen

## 2014-02-07 ENCOUNTER — Non-Acute Institutional Stay (SKILLED_NURSING_FACILITY): Payer: Medicare Other | Admitting: Internal Medicine

## 2014-02-07 DIAGNOSIS — N3289 Other specified disorders of bladder: Secondary | ICD-10-CM

## 2014-02-07 DIAGNOSIS — Z7901 Long term (current) use of anticoagulants: Secondary | ICD-10-CM

## 2014-02-07 NOTE — Progress Notes (Signed)
MRN: [id number] Name: Noah Torres  Sex: male Age: 73 y.o. DOB: March 10, 1940  [place] [facility]: [facility] Facility/Room: 419 Level Of Care: SNF Provider: Inocencio Homes D Emergency Contacts: Extended Emergency Contact Information Primary Emergency Contact: [place],  [place] Home Phone: [phone] Relation: None Secondary Emergency Contact: Jones,[name] Address: [address]          [place],  [address] [name] of [place] Phone: [phone] Relation: Other  Code Status: FULL  Allergies: Metformin and related  Chief Complaint  Patient presents with  . Medical Management of Chronic Issues    HPI: Patient is 73 y.o. male who is being seen to consider how his anticoagulation for DVT/PE prophylaxis will be handled for his bladder surgery 02/23/2013.  Past Medical History  Diagnosis Date  . Diabetes mellitus, type 2   . Hypercholesterolemia   . Former smoker   . Bilateral hearing loss     Wears hearing aids  . Chronic kidney disease   . Hearing loss     uses hearing aids  . Seizures 09/13/2013    Past Surgical History  Procedure Laterality Date  . Spine surgery        Medication List       This list is accurate as of: 02/07/14 11:59 PM.  Always use your most recent med list.               carvedilol 12.5 MG tablet  Commonly known as:  COREG  Place 1 tablet (12.5 mg total) into feeding tube 2 (two) times daily with a meal.     insulin aspart 100 UNIT/ML injection  Commonly known as:  novoLOG  Inject 5 Units into the skin 3 (three) times daily before meals. Give 5 units prior to meals for cbg >=175     insulin glargine 100 UNIT/ML injection  Commonly known as:  LANTUS  Inject 35 Units into the skin at bedtime.     insulin regular 100 units/mL injection  Commonly known as:  NOVOLIN R,HUMULIN R  Inject 12 Units into the skin as needed for high blood sugar. Give 12 units SQ as needed for blood sugar over 401.     levETIRAcetam 500 MG  tablet  Commonly known as:  KEPPRA  - Take 500 mg by mouth 2 (two) times daily. Takeone half tab to equal 250 mg every morning   -   - Take 1-1/2 tablets to equal 750 mg daily at bedtime     meclizine 25 MG tablet  Commonly known as:  ANTIVERT  Take 25 mg by mouth 2 (two) times daily.     pantoprazole sodium 40 mg/20 mL Pack  Commonly known as:  PROTONIX  Place 20 mLs (40 mg total) into feeding tube daily.     warfarin 7.5 MG tablet  Commonly known as:  COUMADIN  Take 6 mg by mouth daily.        No orders of the defined types were placed in this encounter.    Immunization History  Administered Date(s) Administered  . Influenza-Unspecified 11/29/2012, 12/15/2013    History  Substance Use Topics  . Smoking status: Former Research scientist (life sciences)  . Smokeless tobacco: Not on file  . Alcohol Use: No    Review of Systems  DATA OBTAINED: from patient; dementia, no c/o. GENERAL:  no fevers, fatigue, appetite changes SKIN: No itching, rash HEENT: No complaint RESPIRATORY: No cough, wheezing, SOB CARDIAC: No chest pain, palpitations,  lower extremity edema  GI: No abdominal pain, No N/V/D or constipation, No heartburn or reflux  GU: No dysuria, frequency or urgency, or incontinence  MUSCULOSKELETAL: No unrelieved bone/joint pain NEUROLOGIC: No headache, dizziness  PSYCHIATRIC: No overt anxiety or sadness  Filed Vitals:   02/07/14 0940  BP: 106/66  Pulse: 72  Temp: 97.6 F (36.4 C)  Resp: 20    Physical Exam  GENERAL APPEARANCE: Alert, minconversant, No acute distress  SKIN: No diaphoresis rash HEENT: Unremarkable RESPIRATORY: Breathing is even, unlabored. Lung sounds are clear   CARDIOVASCULAR: Heart RRR no murmurs, rubs or gallops. No peripheral edema  GASTROINTESTINAL: Abdomen is soft, non-tender, not distended w/ normal bowel sounds.  GENITOURINARY: Bladder non tender, not distended  MUSCULOSKELETAL: No abnormal joints or musculature NEUROLOGIC: Cranial nerves 2-12  grossly intact. PSYCHIATRIC: dementia, no behavioral issues  Patient Active Problem List   Diagnosis Date Noted  . Type 2 diabetes with nephropathy 01/02/2014  . Mass of bladder 12/11/2013  . Pain in joint, lower leg 11/12/2013  . DVT (deep venous thrombosis) 11/09/2013  . Seizures 11/09/2013  . Altered mental status 10/16/2013  . Weakness 10/16/2013  . New onset seizure 09/13/2013  . Dizziness 07/19/2013  . Fever, unspecified 04/05/2013  . Cough 04/05/2013  . Type II or unspecified type diabetes mellitus with renal manifestations, uncontrolled 04/01/2013  . Thrush 03/01/2013  . Anemia of other chronic disease 09/22/2012  . Secondary renovascular hypertension, benign 07/26/2012  . Diabetes mellitus type 2 with complications 56/25/6389  . GERD (gastroesophageal reflux disease) 07/04/2012  . Chronic anticoagulation 07/04/2012  . Anxiety state, unspecified 07/04/2012  . Essential hypertension, benign 07/04/2012  . Type II or unspecified type diabetes mellitus without mention of complication, not stated as uncontrolled 07/04/2012  . Esophageal reflux 07/04/2012  . Other pulmonary embolism and infarction 07/04/2012  . Long term current use of anticoagulant therapy 05/09/2012  . HTN (hypertension) 01/21/2012  . DM type 2 (diabetes mellitus, type 2) 01/21/2012  . Urinary retention 01/21/2012  . Encephalopathy 01/09/2012  . Normal pressure hydrocephalus 01/09/2012  . Acute respiratory failure 01/04/2012  . PE (pulmonary thromboembolism) 01/04/2012  . Cardiac arrest 01/04/2012  . Acute renal failure 01/04/2012    CBC    Component Value Date/Time   WBC 5.1 01/21/2012 0720   RBC 3.72* 01/21/2012 0720   HGB 9.7* 01/21/2012 0720   HCT 30.3* 01/21/2012 0720   PLT 468* 01/21/2012 0720   MCV 81.5 01/21/2012 0720    CMP     Component Value Date/Time   NA 139 01/21/2012 0720   K 3.6 01/21/2012 0720   CL 108 01/21/2012 0720   CO2 18* 01/21/2012 0720   GLUCOSE 185* 01/21/2012  0720   BUN 53* 01/21/2012 0720   CREATININE 4.16* 01/21/2012 0720   CALCIUM 8.5 01/21/2012 0720   PROT 7.7 01/15/2012 0440   ALBUMIN 2.1* 01/21/2012 0720   AST 36 01/15/2012 0440   ALT 53 01/15/2012 0440   ALKPHOS 120* 01/15/2012 0440   BILITOT 0.4 01/15/2012 0440   GFRNONAA 13* 01/21/2012 0720   GFRAA 15* 01/21/2012 0720    Assessment and Plan  Mass of bladder Pt is having surgery per Dr Estill Dooms scheduled for 02/23/2014, under general anesthesia for transurethral resection of bladder tumor, most certainly CA. Pt is being seen for consideration of how to handle his chronic coumadin.  Long term current use of anticoagulant therapy Pt is having bladder surgery 02/23/2013 for mass, most likely CA. 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 This makes him high risk for clot but he is also high risk for bleed with bladder surgery. Will plan lovenox bridge. Pt's INR 12/28 is 2.0 so will plan to stop coumadin on Sunday 1/3 and start Lovenox bridge Monday 02/19/2013   40 mg SQ daily at 7 am with last dose 24 hours prior to surgery, 7 am on the 7th. Surgery on the 8th  and restart lovenox 1/10 until coumadin theraputic and restart  coumadin the night of the 9th. This was discussed per phone with Dr. Estill Dooms, both of [place] being aware of risk and benefit.    Hennie Duos, MD

## 2014-02-13 ENCOUNTER — Encounter: Payer: Self-pay | Admitting: Internal Medicine

## 2014-02-13 NOTE — Assessment & Plan Note (Signed)
Pt is having bladder surgery 02/23/2013 for mass, most likely CA. 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 This makes him high risk for clot but he is also high risk for bleed with bladder surgery. Will plan lovenox bridge. Pt's INR 12/28 is 2.0 so will plan to stop coumadin on Sunday 1/3 and start Lovenox bridge Monday 02/19/2013   40 mg SQ daily at 7 am with last dose 24 hours prior to surgery, 7 am on the 7th. Surgery on the 8th  and restart lovenox 1/10 until coumadin theraputic and restart  coumadin the night of the 9th. This was discussed per phone with Dr. Estill Dooms, both of [place] being aware of risk and benefit.

## 2014-02-13 NOTE — Assessment & Plan Note (Signed)
Pt is having surgery per Dr Estill Dooms scheduled for 02/23/2014, under general anesthesia for transurethral resection of bladder tumor, most certainly CA. Pt is being seen for consideration of how to handle his chronic coumadin.

## 2014-03-07 ENCOUNTER — Non-Acute Institutional Stay (SKILLED_NURSING_FACILITY): Payer: Medicare Other | Admitting: Internal Medicine

## 2014-03-07 DIAGNOSIS — E118 Type 2 diabetes mellitus with unspecified complications: Secondary | ICD-10-CM

## 2014-03-07 NOTE — Assessment & Plan Note (Signed)
20 days of CBG's reviewed;all low values were in am, lowest being 46, 47, 61 on 3 different days, range 46- 198 with most below 100 or low 100's. Highest reading were all at dinner 142-376.  PLAN- continue 5 units at lunch for CBG > 175 and change to 5 units at dinner for CBG > 250. No change in the lantus insulin at 35 units qHs.

## 2014-03-18 ENCOUNTER — Encounter: Payer: Self-pay | Admitting: Internal Medicine

## 2014-03-18 NOTE — Progress Notes (Signed)
MRN: [id number] Name: Noah Torres  Sex: male Age: 74 y.o. DOB: March 25, 1940  [place] [facility]: [facility] Facility/Room: Level Of Care: SNF Provider: Inocencio Homes D Emergency Contacts: Extended Emergency Contact Information Primary Emergency Contact: [place],  [place] Home Phone: [phone] Relation: None Secondary Emergency Contact: Jones,[name] Address: [address]          [place], [place] [address] [name] of [place] Phone: [phone] Relation: Other  Code Status: FULL  Allergies: Metformin and related  Chief Complaint  Patient presents with  . Medical Management of Chronic Issues    HPI: Patient is 74 y.o. male who nursing has asked me to see for low blood sugars in the morning.  Past Medical History  Diagnosis Date  . Diabetes mellitus, type 2   . Hypercholesterolemia   . Former smoker   . Bilateral hearing loss     Wears hearing aids  . Chronic kidney disease   . Hearing loss     uses hearing aids  . Seizures 09/13/2013    Past Surgical History  Procedure Laterality Date  . Spine surgery        Medication List       This list is accurate as of: 03/07/14 11:59 PM.  Always use your most recent med list.               carvedilol 12.5 MG tablet  Commonly known as:  COREG  Place 1 tablet (12.5 mg total) into feeding tube 2 (two) times daily with a meal.     insulin aspart 100 UNIT/ML injection  Commonly known as:  novoLOG  Inject 5 Units into the skin 3 (three) times daily before meals. Give 5 units prior to meals for cbg >=175     insulin glargine 100 UNIT/ML injection  Commonly known as:  LANTUS  Inject 35 Units into the skin at bedtime.     insulin regular 100 units/mL injection  Commonly known as:  NOVOLIN R,HUMULIN R  Inject 12 Units into the skin as needed for high blood sugar. Give 12 units SQ as needed for blood sugar over 401.     levETIRAcetam 500 MG tablet  Commonly known as:  KEPPRA  - Take 500 mg by mouth 2  (two) times daily. Takeone half tab to equal 250 mg every morning   -   - Take 1-1/2 tablets to equal 750 mg daily at bedtime     meclizine 25 MG tablet  Commonly known as:  ANTIVERT  Take 25 mg by mouth 2 (two) times daily.     pantoprazole sodium 40 mg/20 mL Pack  Commonly known as:  PROTONIX  Place 20 mLs (40 mg total) into feeding tube daily.     warfarin 7.5 MG tablet  Commonly known as:  COUMADIN  Take 6 mg by mouth daily.        No orders of the defined types were placed in this encounter.    Immunization History  Administered Date(s) Administered  . Influenza-Unspecified 11/29/2012, 12/15/2013    History  Substance Use Topics  . Smoking status: Former Research scientist (life sciences)  . Smokeless tobacco: Not on file  . Alcohol Use: No    Review of Systems  DATA OBTAINED: from patient, nurse, medical record, family member GENERAL:  no fevers, fatigue, appetite changes SKIN: No itching, rash HEENT: No complaint RESPIRATORY: No cough, wheezing, SOB CARDIAC: No chest pain, palpitations, lower extremity edema  GI:  No abdominal pain, No N/V/D or constipation, No heartburn or reflux  GU: No dysuria, frequency or urgency, or incontinence  MUSCULOSKELETAL: No unrelieved bone/joint pain NEUROLOGIC: No headache, dizziness  PSYCHIATRIC: No overt anxiety or sadness  Filed Vitals:   03/18/14 1859  BP: 139/78  Pulse: 81  Temp: 98.2 F (36.8 C)  Resp: 20    Physical Exam  GENERAL APPEARANCE: Alert,min conversant, No acute distress  SKIN: No diaphoresis rash, HEENT: Unremarkable RESPIRATORY: Breathing is even, unlabored. Lung sounds are clear   CARDIOVASCULAR: Heart RRR no murmurs, rubs or gallops. No peripheral edema  GASTROINTESTINAL: Abdomen is soft, non-tender, not distended w/ normal bowel sounds.  GENITOURINARY: Bladder non tender, not distended  MUSCULOSKELETAL: No abnormal joints or musculature NEUROLOGIC: Cranial nerves 2-12 grossly intact. Moves all  extremities PSYCHIATRIC: dementia, no behavioral issues  Patient Active Problem List   Diagnosis Date Noted  . Type 2 diabetes with nephropathy 01/02/2014  . Mass of bladder 12/11/2013  . Pain in joint, lower leg 11/12/2013  . DVT (deep venous thrombosis) 11/09/2013  . Seizures 11/09/2013  . Altered mental status 10/16/2013  . Weakness 10/16/2013  . New onset seizure 09/13/2013  . Dizziness 07/19/2013  . Fever, unspecified 04/05/2013  . Cough 04/05/2013  . Type II or unspecified type diabetes mellitus with renal manifestations, uncontrolled 04/01/2013  . Thrush 03/01/2013  . Anemia of other chronic disease 09/22/2012  . Secondary renovascular hypertension, benign 07/26/2012  . Diabetes mellitus type 2 with complications 88/82/8003  . GERD (gastroesophageal reflux disease) 07/04/2012  . Chronic anticoagulation 07/04/2012  . Anxiety state, unspecified 07/04/2012  . Essential hypertension, benign 07/04/2012  . Type II or unspecified type diabetes mellitus without mention of complication, not stated as uncontrolled 07/04/2012  . Esophageal reflux 07/04/2012  . Other pulmonary embolism and infarction 07/04/2012  . Long term current use of anticoagulant therapy 05/09/2012  . HTN (hypertension) 01/21/2012  . DM type 2 (diabetes mellitus, type 2) 01/21/2012  . Urinary retention 01/21/2012  . Encephalopathy 01/09/2012  . Normal pressure hydrocephalus 01/09/2012  . Acute respiratory failure 01/04/2012  . PE (pulmonary thromboembolism) 01/04/2012  . Cardiac arrest 01/04/2012  . Acute renal failure 01/04/2012    CBC    Component Value Date/Time   WBC 5.1 01/21/2012 0720   RBC 3.72* 01/21/2012 0720   HGB 9.7* 01/21/2012 0720   HCT 30.3* 01/21/2012 0720   PLT 468* 01/21/2012 0720   MCV 81.5 01/21/2012 0720    CMP     Component Value Date/Time   NA 139 01/21/2012 0720   K 3.6 01/21/2012 0720   CL 108 01/21/2012 0720   CO2 18* 01/21/2012 0720   GLUCOSE 185* 01/21/2012 0720    BUN 53* 01/21/2012 0720   CREATININE 4.16* 01/21/2012 0720   CALCIUM 8.5 01/21/2012 0720   PROT 7.7 01/15/2012 0440   ALBUMIN 2.1* 01/21/2012 0720   AST 36 01/15/2012 0440   ALT 53 01/15/2012 0440   ALKPHOS 120* 01/15/2012 0440   BILITOT 0.4 01/15/2012 0440   GFRNONAA 13* 01/21/2012 0720   GFRAA 15* 01/21/2012 0720    Assessment and Plan  Diabetes mellitus type 2 with complications 20 days of CBG's reviewed;all low values were in am, lowest being 46, 47, 61 on 3 different days, range 46- 198 with most below 100 or low 100's. Highest reading were all at dinner 142-376.  PLAN- continue 5 units at lunch for CBG > 175 and change to 5 units at dinner for CBG > 250. No  change in the lantus insulin at 35 units qHs.    Pt was seen 03/07/2014. Hennie Duos, MD

## 2014-04-05 ENCOUNTER — Encounter: Payer: Self-pay | Admitting: Internal Medicine

## 2014-04-05 ENCOUNTER — Non-Acute Institutional Stay (SKILLED_NURSING_FACILITY): Payer: Medicare Other | Admitting: Internal Medicine

## 2014-04-05 DIAGNOSIS — I82409 Acute embolism and thrombosis of unspecified deep veins of unspecified lower extremity: Secondary | ICD-10-CM | POA: Diagnosis not present

## 2014-04-05 DIAGNOSIS — I10 Essential (primary) hypertension: Secondary | ICD-10-CM

## 2014-04-05 DIAGNOSIS — R569 Unspecified convulsions: Secondary | ICD-10-CM

## 2014-04-05 DIAGNOSIS — D494 Neoplasm of unspecified behavior of bladder: Secondary | ICD-10-CM

## 2014-04-05 DIAGNOSIS — Z7901 Long term (current) use of anticoagulants: Secondary | ICD-10-CM

## 2014-04-05 DIAGNOSIS — E118 Type 2 diabetes mellitus with unspecified complications: Secondary | ICD-10-CM | POA: Diagnosis not present

## 2014-04-05 DIAGNOSIS — I2699 Other pulmonary embolism without acute cor pulmonale: Secondary | ICD-10-CM | POA: Diagnosis not present

## 2014-04-05 NOTE — Progress Notes (Signed)
Patient ID: Noah Torres, male   DOB: Feb 08, 1941, 74 y.o.   MRN: [id number]   This is an-routine visit.  Level care skilled.  [facility].  Chief complaint acute visit follow-up renal insufficiency-bladder mass. Medical management of chronic issues including history of DVTpulmonary embolism on chronic Coumadindiabetes type 2-history seizures?-Normal pressure hydrocephalus-hypertension.  History of present illness.  Patient is a pleasant 74 year old male with a multitude of medical issues as noted above.  Last year he did have a brief hospitalization for what was thought to be a new onset seizure and he is on Keppra twice a day--since theny at times been possible seizure activity this appears to be more abscense--according nurses have possibly had a seizure over the week and there is been no reoccurrence  Also continues on Coumadin for history of DVT right leg as well as pulmonary embolism this is been stable for some time.  Last year sent to the ER  Complaining of significant discomfort-workup there was fairly nondiagnostic did not show an infectious etiology-however MRI did show a mass of the bladder in the left posterior bladder area concernr primary bladder neoplasm-  She has followed up with this and it appears there is suspicion of invasive high-grade carcinoma -per review of urology note they were going to discuss aggressiveness of treatment with his family this was in mid January  He is not really complaining of dysuria today or any suprapubic discomfort.  H He has a history of chronic renal failure Baseline creatinine appears to run in the 2-2 0.5 range we will update this  According in nursing staff he is eating and drinking fairly well.  Patient also has a history of hypertension--pressures appear to be fairly well controlled I see arrange 112/67-149/82 recently appears systolics often are in the 120s-130s area is appears improved he is on low dose Norvasc 2.5 mg a  day as well as Coreg 12.5 mg twice a day.  He also has a history of type 2 diabetes he is on Lantus insulin 35 units daily at bedtime as well as 5 units of NovoLog for CBGs greater than 175 at lunch and greater than 250 at dinner Recent CBGs in the morning appear somewhat low. Ranging from the 60s-90s-later in the day appear to be more--low- mid 100s higher 100s occasionally above 200 but this does not appear to be frequent-again there is some variability  .     Family medical social history has been reviewed per previous progress notes including 01/02/2014 and 03/07/2014 as well as oncology consult note 03/02/2014.  Medications have been reviewed per MAR . This includes Keppra 250 mg every morning 750 mg daily at bedtime.  Protonix 40 mg daily.  Norvasc 2.5 mg daily.  Multivitamin daily.  Humalog insulin 5 units subcutaneous at lunch for CBG greater than 75 also 5 units at dinner for CBG greater than 250  Coumadin 6 mg by mouth daily.  Lispro 10 mg daily.  Lantus insulin 35 units subcutaneous daily at bedtime.  Coreg 12.5 mg twice a day.    Review of systems.  In general no complaints of fever or chills.  Respiratory does not complain of shortness breath or cough.  Cardiac no chest pain or significant lower extremity edema.  GI does not complain of abdominal discomfort nausea or vomiting diarrhea constipation.  Muscle skeletal is not complaining of joint pain  GU-bladder issues as noted above but he does not complain of dysuria or burning.  Neurologic does not complain of  headache or dizziness he does have a history of possible seizures.  Psychiatric no overt anxiety or sadness.  Physical exam.  Temperature 98.7 pulse 73 respirations 18 blood pressure 149/82-135/76-most recently  In general this is a pleasant elderly male in no distress resting comfortably in bed.  His skin is warm and dry.  Head is normocephalic atraumatic.  Oropharynx clear mucous  membranes appear fairly moist.  Chest is clear to auscultation no labored breathing.  Heart is regular rate and rhythm murmur gallop or rub he has minimal lower extremity edema.  GI abdomen soft nontender positive bowel sounds. Muscle skeletal moves all extremities 4 with some mild right-sided weakness although this appears to be baseline-I do not see any deformities.  Neurologic cranial nerves grossly intact tongue midline again possibly some mild right-sided weakness compared to the left.  Psych he is alert and responsive pleasant and appropriate although appears to have some mild confusion at baseline.  Labs.  01/01/2014.  Sodium 147 potassium 4.2 BUN 25 and 2.52.  12/12/2013   sodium 146 potassium 3.9 BUN 31 creatinine 2.1.  WBC 4.9 hemoglobin 12.5 platelets 204  12/09/2013.  Sodium 141 potassium 4.9 BUN 35 creatinine 2.22.  Liver function tests within normal limits.   Assessment and plan.  #1-history t seizure--continues on Keppra twice a day to 50 in the morning 750 at night family was concerned about mental status changes in this lower dose in the morning-nursing staff cannot really tell me today exactly what kind seizure if there was a seizure on the weekend since it was the weekend staff--his roommate however states that he knows it was of short duration but cannot really describe what it was like-per roommate it was very short lasting r.  Will update a CBC and metabolic panel as well as TSH and monitor for any further episodes  I do note a.m. Dose was reduced secondary to family concerns about mental status changes in patient.  #2 normal pressure hydrocephalus-apparently this is been stable per  most recent MRI.  #3 hypertension appears to be fairly stable on low dose Norvasc as well as Coreg  #4 history of pulmonary embolism-he is on chronic Coumadin most recent INR was therapeutic update INR is pending.  History of bladder mass-This is followed by  urology---per discussion with his daughter update meeting with [facility] is pending to discuss aggressiveness of care   History right leg DVT again he is on chronic Coumadin-he is not complaining of leg discomfort today.  History of diabetes-blood sugars are somewhat variable b But have some concern about the lower blood sugars in the morning we will decrease the Lantus to 25 units daily at bedtime and monitor continue the  humalog 5 units with current parameters of greater than 175 at lunch and greater than 250 at dinner.  History of chronic kidney disease  Baseline creatinine appears to be in the low twos will update this.  CPT-i [id number]-of note greater than 35 minutes spent assessing patient-discussing his status with nursing staff  And daughter-reviewing  charts-and coordinating in formulating a plan of care for numerous diagnoses-of note greater than 50% of time spent coordinating plan of care   .   Marland Kitchen

## 2014-04-06 LAB — BASIC METABOLIC PANEL
BUN: 19 mg/dL (ref 4–21)
CREATININE: 1.9 mg/dL — AB (ref <=1.3)
Glucose: 60 mg/dL
POTASSIUM: 3.8 mmol/L (ref 3.4–5.3)
Sodium: 142 mmol/L (ref 137–147)

## 2014-04-06 LAB — HEPATIC FUNCTION PANEL
ALK PHOS: 79 U/L (ref 25–125)
ALT: 10 U/L (ref 10–40)
AST: 15 U/L (ref 14–40)
BILIRUBIN, TOTAL: 0.2 mg/dL

## 2014-04-06 LAB — TSH: TSH: 1.11 u[IU]/mL (ref <=5.90)

## 2014-04-11 ENCOUNTER — Non-Acute Institutional Stay (SKILLED_NURSING_FACILITY): Payer: Medicare Other | Admitting: Internal Medicine

## 2014-04-11 ENCOUNTER — Encounter: Payer: Self-pay | Admitting: Internal Medicine

## 2014-04-11 DIAGNOSIS — D509 Iron deficiency anemia, unspecified: Secondary | ICD-10-CM | POA: Diagnosis not present

## 2014-04-11 NOTE — Progress Notes (Signed)
MRN: [id number] Name: Noah Torres  Sex: male Age: 74 y.o. DOB: [birth year]  [place] #: [facility] Facility/Room: 422D Level Of Care: SNF Provider: Inocencio Homes D Emergency Contacts: Extended Emergency Contact Information Primary Emergency Contact: [place],  [place] Home Phone: [phone] Relation: None Secondary Emergency Contact: Jones,[name] Address: [address]          [place], [place] [address] [name] of [place] Phone: [phone] Relation: Other  Code Status: FULL  Allergies: Metformin and related  Chief Complaint  Patient presents with  . Acute Visit    HPI: Patient is 74 y.o. male who is being seen for anemia.  Past Medical History  Diagnosis Date  . Diabetes mellitus, type 2   . Hypercholesterolemia   . Former smoker   . Bilateral hearing loss     Wears hearing aids  . Chronic kidney disease   . Hearing loss     uses hearing aids  . Seizures 09/13/2013    Past Surgical History  Procedure Laterality Date  . Spine surgery        Medication List       This list is accurate as of: 04/11/14  7:26 PM.  Always use your most recent med list.               amLODipine 2.5 MG tablet  Commonly known as:  NORVASC  Take 2.5 mg by mouth daily.     carvedilol 12.5 MG tablet  Commonly known as:  COREG  Place 1 tablet (12.5 mg total) into feeding tube 2 (two) times daily with a meal.     insulin aspart 100 UNIT/ML injection  Commonly known as:  novoLOG  Inject 5 Units into the skin 3 (three) times daily before meals. Give 5 units prior to meals for cbg >=175     insulin glargine 100 UNIT/ML injection  Commonly known as:  LANTUS  Inject 35 Units into the skin at bedtime.     insulin regular 100 units/mL injection  Commonly known as:  NOVOLIN R,HUMULIN R  Inject 12 Units into the skin as needed for high blood sugar. Give 12 units SQ as needed for blood sugar over 401.     levETIRAcetam 500 MG tablet  Commonly known as:   KEPPRA  - Take 500 mg by mouth 2 (two) times daily. Takeone half tab to equal 250 mg every morning   -   - Take 1-1/2 tablets to equal 750 mg daily at bedtime     meclizine 25 MG tablet  Commonly known as:  ANTIVERT  Take 25 mg by mouth 2 (two) times daily.     pantoprazole sodium 40 mg/20 mL Pack  Commonly known as:  PROTONIX  Place 20 mLs (40 mg total) into feeding tube daily.     warfarin 7.5 MG tablet  Commonly known as:  COUMADIN  Take 6 mg by mouth daily.        No orders of the defined types were placed in this encounter.    Immunization History  Administered Date(s) Administered  . Influenza-Unspecified 11/29/2012, 12/15/2013    History  Substance Use Topics  . Smoking status: Former Research scientist (life sciences)  . Smokeless tobacco: Not on file  . Alcohol Use: No    Review of Systems  DATA OBTAINED: from patient, nurse, medical record GENERAL:  no fevers, fatigue, appetite changes SKIN: No itching, rash HEENT: No complaint RESPIRATORY: No cough, wheezing, SOB  CARDIAC: No chest pain, palpitations, lower extremity edema  GI: No abdominal pain, No N/V/D or constipation, No heartburn or reflux  GU: No dysuria, frequency or urgency, or incontinence  MUSCULOSKELETAL: No unrelieved bone/joint pain NEUROLOGIC: No headache, dizziness  PSYCHIATRIC: No overt anxiety or sadness  Filed Vitals:   04/11/14 1916  BP: 139/78  Pulse: 69  Temp: 98 F (36.7 C)  Resp: 20    Physical Exam  GENERAL APPEARANCE: Alert, modconversant,BM in WC  No acute distress  SKIN: No diaphoresis rash HEENT: Unremarkable RESPIRATORY: Breathing is even, unlabored. Lung sounds are clear   CARDIOVASCULAR: Heart RRR no murmurs, rubs or gallops. No peripheral edema  GASTROINTESTINAL: Abdomen is soft, non-tender, not distended w/ normal bowel sounds.  GENITOURINARY: Bladder non tender, not distended  MUSCULOSKELETAL: No abnormal joints or musculature NEUROLOGIC: Cranial nerves 2-12 grossly  intact PSYCHIATRIC: Mood and affect appropriate to situation, no behavioral issues  Patient Active Problem List   Diagnosis Date Noted  . Type 2 diabetes with nephropathy 01/02/2014  . Mass of bladder 12/11/2013  . Pain in joint, lower leg 11/12/2013  . DVT (deep venous thrombosis) 11/09/2013  . Seizures 11/09/2013  . Altered mental status 10/16/2013  . Weakness 10/16/2013  . New onset seizure 09/13/2013  . Dizziness 07/19/2013  . Fever, unspecified 04/05/2013  . Cough 04/05/2013  . Type II or unspecified type diabetes mellitus with renal manifestations, uncontrolled 04/01/2013  . Thrush 03/01/2013  . Anemia of other chronic disease 09/22/2012  . Secondary renovascular hypertension, benign 07/26/2012  . Diabetes mellitus type 2 with complications 51/70/0174  . GERD (gastroesophageal reflux disease) 07/04/2012  . Chronic anticoagulation 07/04/2012  . Anxiety state, unspecified 07/04/2012  . Essential hypertension, benign 07/04/2012  . Type II or unspecified type diabetes mellitus without mention of complication, not stated as uncontrolled 07/04/2012  . Esophageal reflux 07/04/2012  . Other pulmonary embolism and infarction 07/04/2012  . Long term current use of anticoagulant therapy 05/09/2012  . HTN (hypertension) 01/21/2012  . DM type 2 (diabetes mellitus, type 2) 01/21/2012  . Urinary retention 01/21/2012  . Encephalopathy 01/09/2012  . Normal pressure hydrocephalus 01/09/2012  . Acute respiratory failure 01/04/2012  . PE (pulmonary thromboembolism) 01/04/2012  . Cardiac arrest 01/04/2012  . Acute renal failure 01/04/2012    CBC    Component Value Date/Time   WBC 5.1 01/21/2012 0720   RBC 3.72* 01/21/2012 0720   HGB 9.7* 01/21/2012 0720   HCT 30.3* 01/21/2012 0720   PLT 468* 01/21/2012 0720   MCV 81.5 01/21/2012 0720    CMP     Component Value Date/Time   NA 139 01/21/2012 0720   K 3.6 01/21/2012 0720   CL 108 01/21/2012 0720   CO2 18* 01/21/2012 0720    GLUCOSE 185* 01/21/2012 0720   BUN 53* 01/21/2012 0720   CREATININE 4.16* 01/21/2012 0720   CALCIUM 8.5 01/21/2012 0720   PROT 7.7 01/15/2012 0440   ALBUMIN 2.1* 01/21/2012 0720   AST 36 01/15/2012 0440   ALT 53 01/15/2012 0440   ALKPHOS 120* 01/15/2012 0440   BILITOT 0.4 01/15/2012 0440   GFRNONAA 13* 01/21/2012 0720   GFRAA 15* 01/21/2012 0720    Assessment and Plan  No problem-specific assessment & plan notes found for this encounter.   Hennie Duos, MD

## 2014-04-11 NOTE — Assessment & Plan Note (Addendum)
11/2013 Hb 12.5,  12/2013 Hb 9.2,  03/2014  Hb 8.0  With MCV 77 and MCV had gotten smaller with each result. Pt has recent dx bladder CA but nursing has not seen blood in pt's foley. No prior iron studies. Pt is on coumadin for DVT. MCV makes dx. Starting FeSo4 325 mg BID and recheck CBC in several weeks.

## 2014-04-30 LAB — CBC AND DIFFERENTIAL
HCT: 30 % — AB (ref 41–53)
Hemoglobin: 9.3 g/dL — AB (ref 13.5–17.5)
Platelets: 285 10*3/uL (ref 150–399)
WBC: 4.9 10*3/mL

## 2014-05-15 ENCOUNTER — Non-Acute Institutional Stay (SKILLED_NURSING_FACILITY): Payer: Medicare Other | Admitting: Internal Medicine

## 2014-05-15 DIAGNOSIS — Z7901 Long term (current) use of anticoagulants: Secondary | ICD-10-CM

## 2014-05-15 NOTE — Progress Notes (Signed)
MRN: [id number] Name: Noah Torres  Sex: male Age: 74 y.o. DOB: 12-28-1940  [place] #: [facility] Facility/Room: 270 Level Of Care: SNF Provider: Inocencio Homes D Emergency Contacts: Extended Emergency Contact Information Primary Emergency Contact: [place],  [place] Home Phone: [phone] Relation: None Secondary Emergency Contact: Jones,[name] Address: [address]          [place], [place] [address] [name] of [place] Phone: [phone] Relation: Other  Code Status:FULL   Allergies: Metformin and related  Chief Complaint  Patient presents with  . Acute Visit    HPI: Patient is 74 y.o. male who is being seen to work out coumadin schedule for having a tooth pulled.  Past Medical History  Diagnosis Date  . Diabetes mellitus, type 2   . Hypercholesterolemia   . Former smoker   . Bilateral hearing loss     Wears hearing aids  . Chronic kidney disease   . Hearing loss     uses hearing aids  . Seizures 09/13/2013    Past Surgical History  Procedure Laterality Date  . Spine surgery        Medication List       This list is accurate as of: 05/15/14 11:59 PM.  Always use your most recent med list.               amLODipine 2.5 MG tablet  Commonly known as:  NORVASC  Take 2.5 mg by mouth daily.     carvedilol 12.5 MG tablet  Commonly known as:  COREG  Place 1 tablet (12.5 mg total) into feeding tube 2 (two) times daily with a meal.     insulin aspart 100 UNIT/ML injection  Commonly known as:  novoLOG  Inject 5 Units into the skin 3 (three) times daily before meals. Give 5 units prior to meals for cbg >=175     insulin glargine 100 UNIT/ML injection  Commonly known as:  LANTUS  Inject 35 Units into the skin at bedtime.     insulin regular 100 units/mL injection  Commonly known as:  NOVOLIN R,HUMULIN R  Inject 12 Units into the skin as needed for high blood sugar. Give 12 units SQ as needed for blood sugar over 401.     levETIRAcetam 500 MG tablet  Commonly known as:  KEPPRA  - Take 500 mg by mouth 2 (two) times daily. Takeone half tab to equal 250 mg every morning   -   - Take 1-1/2 tablets to equal 750 mg daily at bedtime     meclizine 25 MG tablet  Commonly known as:  ANTIVERT  Take 25 mg by mouth 2 (two) times daily.     pantoprazole sodium 40 mg/20 mL Pack  Commonly known as:  PROTONIX  Place 20 mLs (40 mg total) into feeding tube daily.     warfarin 7.5 MG tablet  Commonly known as:  COUMADIN  Take 6 mg by mouth daily.        No orders of the defined types were placed in this encounter.    Immunization History  Administered Date(s) Administered  . Influenza-Unspecified 11/29/2012, 12/15/2013    History  Substance Use Topics  . Smoking status: Former Research scientist (life sciences)  . Smokeless tobacco: Not on file  . Alcohol Use: No    Review of Systems  DATA OBTAINED: from patient, nurse GENERAL:  no fevers, fatigue, appetite changes SKIN: No itching, rash HEENT: No complaint RESPIRATORY:  No cough, wheezing, SOB CARDIAC: No chest pain, palpitations, lower extremity edema  GI: No abdominal pain, No N/V/D or constipation, No heartburn or reflux  GU: No dysuria, frequency or urgency, or incontinence  MUSCULOSKELETAL: No unrelieved bone/joint pain NEUROLOGIC: No headache, dizziness  PSYCHIATRIC: No overt anxiety or sadness  Filed Vitals:   05/15/14 2025  BP: 140/84  Pulse: 87  Temp: 97 F (36.1 C)  Resp: 18    Physical Exam  GENERAL APPEARANCE: Alert, conversant, No acute distress  SKIN: No diaphoresis rash HEENT: Unremarkable RESPIRATORY: Breathing is even, unlabored.   CARDIOVASCULAR:  No peripheral edema  GASTROINTESTINAL: Abdomen is not distended.  GENITOURINARY: Bladder non tender, not distended  MUSCULOSKELETAL: No abnormal joints or musculature NEUROLOGIC: Cranial nerves 2-12 grossly intact PSYCHIATRIC: Mood and affect appropriate to situation, no behavioral issues  Patient  Active Problem List   Diagnosis Date Noted  . Type 2 diabetes with nephropathy 01/02/2014  . Mass of bladder 12/11/2013  . Pain in joint, lower leg 11/12/2013  . DVT (deep venous thrombosis) 11/09/2013  . Seizures 11/09/2013  . Altered mental status 10/16/2013  . Weakness 10/16/2013  . New onset seizure 09/13/2013  . Dizziness 07/19/2013  . Fever, unspecified 04/05/2013  . Cough 04/05/2013  . Type II or unspecified type diabetes mellitus with renal manifestations, uncontrolled 04/01/2013  . Thrush 03/01/2013  . Anemia, iron deficiency 09/22/2012  . Secondary renovascular hypertension, benign 07/26/2012  . Diabetes mellitus type 2 with complications 71/07/2692  . GERD (gastroesophageal reflux disease) 07/04/2012  . Chronic anticoagulation 07/04/2012  . Anxiety state, unspecified 07/04/2012  . Essential hypertension, benign 07/04/2012  . Type II or unspecified type diabetes mellitus without mention of complication, not stated as uncontrolled 07/04/2012  . Esophageal reflux 07/04/2012  . Other pulmonary embolism and infarction 07/04/2012  . Long term current use of anticoagulant therapy 05/09/2012  . HTN (hypertension) 01/21/2012  . DM type 2 (diabetes mellitus, type 2) 01/21/2012  . Urinary retention 01/21/2012  . Encephalopathy 01/09/2012  . Normal pressure hydrocephalus 01/09/2012  . Acute respiratory failure 01/04/2012  . PE (pulmonary thromboembolism) 01/04/2012  . Cardiac arrest 01/04/2012  . Acute renal failure 01/04/2012    CBC    Component Value Date/Time   WBC 5.1 01/21/2012 0720   RBC 3.72* 01/21/2012 0720   HGB 9.7* 01/21/2012 0720   HCT 30.3* 01/21/2012 0720   PLT 468* 01/21/2012 0720   MCV 81.5 01/21/2012 0720    CMP     Component Value Date/Time   NA 139 01/21/2012 0720   K 3.6 01/21/2012 0720   CL 108 01/21/2012 0720   CO2 18* 01/21/2012 0720   GLUCOSE 185* 01/21/2012 0720   BUN 53* 01/21/2012 0720   CREATININE 4.16* 01/21/2012 0720   CALCIUM 8.5  01/21/2012 0720   PROT 7.7 01/15/2012 0440   ALBUMIN 2.1* 01/21/2012 0720   AST 36 01/15/2012 0440   ALT 53 01/15/2012 0440   ALKPHOS 120* 01/15/2012 0440   BILITOT 0.4 01/15/2012 0440   GFRNONAA 13* 01/21/2012 0720   GFRAA 15* 01/21/2012 0720    Assessment and Plan  Long term current use of anticoagulant therapy Pt is being seen for management of coumadin with tooth being extracted 4/4. Spoke with dentist office, Dr Annitta Jersey, [facility].They would like PT/INR 48 hours prior, that falls on Saturday, on -call could get involved, don't want that. Will draw on Friday then Monday am. If INR is <3, tooth will be pulled, coumadin does not need  to be held.     Hennie Duos, MD

## 2014-05-19 ENCOUNTER — Encounter: Payer: Self-pay | Admitting: Internal Medicine

## 2014-05-19 NOTE — Assessment & Plan Note (Signed)
Pt is being seen for management of coumadin with tooth being extracted 4/4. Spoke with dentist office, Dr Annitta Jersey, [facility].They would like PT/INR 48 hours prior, that falls on Saturday, on -call could get involved, don't want that. Will draw on Friday then Monday am. If INR is <3, tooth will be pulled, coumadin does not need to be held.

## 2014-07-05 ENCOUNTER — Encounter: Payer: Self-pay | Admitting: *Deleted

## 2014-07-19 ENCOUNTER — Encounter: Payer: Self-pay | Admitting: Internal Medicine

## 2014-07-19 ENCOUNTER — Non-Acute Institutional Stay (SKILLED_NURSING_FACILITY): Payer: Medicare Other | Admitting: Internal Medicine

## 2014-07-19 DIAGNOSIS — G912 (Idiopathic) normal pressure hydrocephalus: Secondary | ICD-10-CM

## 2014-07-19 DIAGNOSIS — E1121 Type 2 diabetes mellitus with diabetic nephropathy: Secondary | ICD-10-CM

## 2014-07-19 DIAGNOSIS — I10 Essential (primary) hypertension: Secondary | ICD-10-CM

## 2014-07-19 DIAGNOSIS — N3289 Other specified disorders of bladder: Secondary | ICD-10-CM | POA: Diagnosis not present

## 2014-07-19 DIAGNOSIS — I2699 Other pulmonary embolism without acute cor pulmonale: Secondary | ICD-10-CM

## 2014-07-19 NOTE — Progress Notes (Signed)
Patient ID: Noah Torres, male   DOB: 1940/07/02, 74 y.o.   MRN: [id number]     This is a routine visit.  Level care skilled.  [facility].  Chief complaint Medical management of chronic issues including history of DVTpulmonary embolism on chronic Coumadindiabetes type 2-history seizures?-Normal pressure hydrocephalus-hypertension.--History of bladder carcinoma  History of present illness.  Patient is a pleasant 74 year old male with a multitude of medical issues as noted above.  Last year he did have a brief hospitalization for what was thought to be a new onset seizure and he is on Keppra twice a day--since theny at times been possible seizure activity this appears to be more abscense-apparently this has not occurred for some time  Also continues on Coumadin for history of DVT right leg as well as pulmonary embolism this has been stable for some time.  Last year sent to the ER  Complaining of significant discomfort-workup there was fairly nondiagnostic did not show an infectious etiology-however MRI did show a mass of the bladder in the left posterior bladder area concernr primary bladder neoplasm-  The [facility] is following this-he has received some radiation therapy for palliative purposes-he is thought not to be a surgical candidate or candidate for chemotherapy.  Emphasis is on palliative care and this appears to be helping-he has completed his radiation.  He does not complain of pain -- at times has had some bleeding but apparently this has subsided as well.  Patient was noted earlier this should have a declining hemoglobin that went as low as 8 in February-Dr. Sheppard Coil did assess this and thought to be iron deficiency he is been put on iron and hemoglobin appears to be rising most recently 9.3 we will need to update this  H He has a history of chronic renal failure Baseline creatinine appears to run in the 2-2 0.5 range we will update this--most  recent creatinine was 1.9  According in nursing staff he is eating and drinking fairly well.  Patient also has a history of hypertension--pressures appear to be fairly well controlled Most recently 120/80-123/77  on Norvasc 2.5 mg a day as well as Coreg 12.5 mg twice a day.  He also has a history of type 2 diabetes he is on Lantus insulin 25 units daily at bedtime as well as 5 units of NovoLog for CBGs greater than 175 at lunch and greater than 250 at dinner He has quite variable blood sugars before meals occasionally a reading in the 60s or 70s in the morning-although I also see ones in the mid 100s and 200s at times.  Later in the day has variability but generally appears to be more in the mid 100 range I see on occasional 70 but this is rare I also see occasional readings in the 3 400 range but this is rare as well.       Family medical social history has been reviewed per previous progress notes including 03/07/2014   as well as oncology consult note 03/02/2014.--And progress note 07/15/2014  Medications have been reviewed per [facility] . This includes Keppra 250 mg every morning 750 mg daily at bedtime.  Protonix 40 mg daily.  Norvasc 2.5 mg daily.  Multivitamin daily.  Humalog insulin 5 units subcutaneous at lunch for CBG greater than 75 also 5 units at dinner for CBG greater than 250  Coumadin 5 mg by mouth daily. .  Lantus insulin 25 units subcutaneous daily at bedtime.  Coreg 12.5 mg  twice a day.  Lexapro 10 mg by mouth daily  Review of systems.  In general no complaints of fever or chills.  Respiratory does not complain of shortness breath or cough.  Cardiac no chest pain or significant lower extremity edema.  GI does not complain of abdominal discomfort nausea or vomiting diarrhea constipation.  Muscle skeletal is not complaining of joint pain  GU-bladder issues as noted above but he does not complain of dysuria or burning.--Apparently no recent  bleeding  Neurologic does not complain of headache or dizziness he does have a history of possible seizures.  Psychiatric no overt anxiety or sadness.  Physical exam.  He is afebrile pulse 80 respirations 20 blood pressure 120/80  In general this is a pleasant elderly male in no distress resting comfortably in bed.  His skin is warm and dry.  Head is normocephalic atraumatic.  Oropharynx clear mucous membranes appear fairly moist.  Chest is clear to auscultation no labored breathing.  Heart is regular rate and rhythm murmur gallop or rub he has minimal lower extremity edema.  GI abdomen soft nontender positive bowel sounds. Muscle skeletal moves all extremities 4 with some mild right-sided weakness although this appears to be baseline-I do not see any deformities.  Neurologic cranial nerves grossly intact tongue midline again possibly some mild right-sided weakness compared to the left.  Psych he is alert and responsive pleasant and appropriate although appears to have some mild confusion at baseline.  Labs.  04/30/2014.  WBC 4.9 hemoglobin 9.3 platelets 285.  Fillet 19 2016.  Sodium 142 potassium 3.8 UA 19 creatinine 1.9-albumen 3.1 otherwise liver function tests within normal limits.  WBC 4.7 hemoglobin 8.0 platelets 288  01/01/2014.  Sodium 147 potassium 4.2 BUN 25 and 2.52.  12/12/2013   sodium 146 potassium 3.9 BUN 31 creatinine 2.1.  WBC 4.9 hemoglobin 12.5 platelets 204  12/09/2013.  Sodium 141 potassium 4.9 BUN 35 creatinine 2.22.  Liver function tests within normal limits.   Assessment and plan.  #1-history t seizure--continues on Keppra twice a day 2 50 in the morning 750 at night  .  #2 normal pressure hydrocephalus-apparently this is been stable per  most recent MRI.  #3 hypertension appears to be fairly stable on low dose Norvasc as well as Coreg  #4 history of pulmonary embolism-he is on chronic Coumadin most recent INR was therapeutic  update INR is pending.  History of bladder mass-This is followed by urology Is status post radiation for palliative reasons-not a surgical candidate or candidate for chemotherapy per oncology-nonetheless he appears to be stable there has been no recent bleeding   History right leg DVT again he is on chronic Coumadin-he is not complaining of leg discomfort today.--Most recent INR 2.5 updated INR is pending  History of diabetes-blood sugars are somewhat variable  But have some concern about the lower blood sugars in the morning we will decrease the Lantus to 22 units daily at bedtime and monitor continue the  humalog 5 units with current parameters of greater than 175 at lunch and greater than 250 at dinner.--Update hemoglobin A1c  History of chronic kidney disease  Baseline creatinine appears to be in the low twos will update this.--Most recently 1.9   Anemia-thought to have an element of iron deficiency-hemoglobin appears to be rising we will update this  [id number]  .   Marland Kitchen

## 2014-07-24 ENCOUNTER — Non-Acute Institutional Stay (SKILLED_NURSING_FACILITY): Payer: Medicare Other | Admitting: Internal Medicine

## 2014-07-24 ENCOUNTER — Encounter: Payer: Self-pay | Admitting: Internal Medicine

## 2014-07-24 DIAGNOSIS — R569 Unspecified convulsions: Secondary | ICD-10-CM | POA: Diagnosis not present

## 2014-07-24 DIAGNOSIS — N3289 Other specified disorders of bladder: Secondary | ICD-10-CM

## 2014-07-24 NOTE — Progress Notes (Signed)
Patient ID: Noah Torres, male   DOB: 01-05-1941, 74 y.o.   MRN: [id number]       This is an acute visit.  Level care skilled.  [place] [facility].  Chief complaint  Acute visit secondary to seizure activity  History of present illness.  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  Last year he did have a brief hospitalization for what was thought to be a new onset seizure and he is on Keppra twice a day- At times she did have what appeared to be an absence seizure but this has been stable for some time-however this week and apparently had a similar seizure-and actually earlier this morning apparently had an absence seizure of quite short duration Per nursing he had a short episode of "staring off in the space"-. Not responding  Currently patient appears to be essentially back at his baseline he is alert responsive-area  I did assess him about an hour later again he was eating lunch appear to be at his baseline-. speaking a bit more than when I initially saw him  This is complicated with a history of bladder cancer thought to be an operable he has completed radiation again the goal is they have.  Patient currently has no complaints of dizziness headache syncope visual changes or chest pain  Also continues on Coumadin for history of DVT right leg as well as pulmonary embolism this has been stable for some time.  L   .       Family medical social history has been reviewed per previous progress notes including 03/07/2014   as well as oncology consult note 03/02/2014.--And progress note 07/15/2014  Medications have been reviewed per [facility] . This includes Keppra 250 mg every morning 750 mg daily at bedtime.  Protonix 40 mg daily.  Norvasc 2.5 mg daily.  Multivitamin daily.  Humalog insulin 5  units subcutaneous at lunch for CBG greater than 75 also 5 units at dinner for CBG greater than 250  Coumadin 5 mg by mouth daily. .  Lantus insulin 22 units subcutaneous daily at bedtime.  Coreg 12.5 mg twice a day.  Lexapro 10 mg by mouth daily  Review of systems.  In general no complaints of fever or chills.  Respiratory does not complain of shortness breath or cough.  Cardiac no chest pain or significant lower extremity edema.  GI does not complain of abdominal discomfort nausea or vomiting diarrhea constipation.  Muscle skeletal is not complaining of joint pain  GU-bladder issues as noted above but he does not complain of dysuria or burning.--Apparently no recent bleeding  Neurologic does not complain of headache or dizziness he does have a history of possible seizures--apparently has had a couple the last few days including this morning.  Psychiatric no overt anxiety or sadness.  Physical exam. T-97.6 pulse 76 respirations20--blood pressure 126/74  In general this is a pleasant elderly male in no distress resting comfortably in bed--initially appear to be sleeping but was easily arousable.  His skin is warm and dry.  Head is normocephalic atraumatic.  Oropharynx clear mucous membranes appear fairly moist.  Chest is clear to auscultation no labored breathing.  Heart is regular rate and rhythm murmur gallop or rub he has minimal lower extremity edema.  GI abdomen soft nontender positive bowel sounds. Muscle skeletal moves all extremities 4 with some  mild right-sided weakness although this appears to be baseline-I do not see any deformities--- appears to move his extremities at baseline-he does not always follow verbal commands which makes exam somewhat challenging.  Neurologic cranial nerves grossly intact tongue midline again possibly some mild right-sided weakness compared to the left. this is not new  Psych he is alert and responsive pleasant and appropriate  although appears to have some confusion --although this is not new  Labs.  04/30/2014.  WBC 4.9 hemoglobin 9.3 platelets 285.  F 19 2016.  Sodium 142 potassium 3.8 UA 19 creatinine 1.9-albumen 3.1 otherwise liver function tests within normal limits.  WBC 4.7 hemoglobin 8.0 platelets 288  01/01/2014.  Sodium 147 potassium 4.2 BUN 25 and 2.52.  12/12/2013   sodium 146 potassium 3.9 BUN 31 creatinine 2.1.  WBC 4.9 hemoglobin 12.5 platelets 204  12/09/2013.  Sodium 141 potassium 4.9 BUN 35 creatinine 2.22.  Liver function tests within normal limits.   Assessment and plan.  #1-history t seizure--continues on Keppra twice a day 2 50 in the morning 750 at night --since seizures appear to have occurred more  recently the last few days-there is some concern about possibly metastasis from his bladder tumor-this was discussed extensively with Dr. Sheppard Coil via phone-I also discussed this with his daughter via phone-will order a CT scan of the head-again this will be more for palliative prophylactic purposes for seizure-if there is metastasis suspect patient may benefit from steriod to prevent seizures-again will await results of CT scan-this was discussed with his daughter as well via phone  Also update labs were ordered last week including a CBC CMP and hemoglobin A1c-apparently those labs were drawn this morning and are pending  At this point monitor patient every 4 hours with neuro checks-x 3  then every shift .  #2 normal pressure hydrocephalus-apparently this is been stable per  most recent MRI.     #3 history of pulmonary embolism-he is on chronic Coumadin most recent INR was therapeutic update INR is pending.  History of bladder mass-This is followed by urology Is status post radiation for palliative reasons-not a surgical candidate or candidate for chemotherapy per oncology-nonetheless he appears to be stable there has been no recent bleeding     [id number]--of note  greater than 40 minutes spent assessing patient-discussing his status with nursing staff-reviewing his chart-and discussion with Dr. Sheppard Coil as well as with his daughter via phone.  Of note greater than 50% of time spent coordinating plan of care    .   Marland Kitchen

## 2014-08-02 ENCOUNTER — Non-Acute Institutional Stay (SKILLED_NURSING_FACILITY): Payer: Medicare Other | Admitting: Internal Medicine

## 2014-08-02 ENCOUNTER — Encounter: Payer: Self-pay | Admitting: Internal Medicine

## 2014-08-02 DIAGNOSIS — R569 Unspecified convulsions: Secondary | ICD-10-CM | POA: Diagnosis not present

## 2014-08-02 DIAGNOSIS — R5081 Fever presenting with conditions classified elsewhere: Secondary | ICD-10-CM

## 2014-08-02 DIAGNOSIS — R258 Other abnormal involuntary movements: Secondary | ICD-10-CM

## 2014-08-02 DIAGNOSIS — R251 Tremor, unspecified: Secondary | ICD-10-CM | POA: Insufficient documentation

## 2014-08-02 NOTE — Progress Notes (Signed)
Patient ID: Noah Torres, male   DOB: 07-31-1940, 74 y.o.   MRN: [id number]        This is an acute visit.  Level care skilled.  [place] [facility].  Chief complaint  Acute visit secondary to intermitted tremors  History of present illness.  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  Last year he did have a brief hospitalization for what was thought to be a new onset seizure and he is on Keppra twice a day- At times he did have what appeared to be an absence seizure but this has been stable for some time- However recently appears to have had these with more frequency.  This was discussed with Dr. Sheppard Coil as well as with his daughter in its been decided to obtain a CT scan of his head-there is concern possibly that there may be some metastasis from a bladder tumor which may be contributing to this increased seizure type activity.  According in nursing is also had some upper extremity tremors increased recently this is intermittent and apparently he had this earlier this evening however when I got in the room these had largely subsided.  Patient appeared to be mentally at his baseline he was able to speak make eye contact and respond to me and followed simple verbal commands-although this is not always the case according to nursing staff  Vital signs appear to be stable he was  tachycardic during the initial tremors but pulse was 100 when I checked him-temperature is 99.9 although he does not appear to be overtly febrile or warm to touch  L O2 saturation is stable in the 90s on room air-blood pressure 132/80      Family medical social history has been reviewed per previous progress notes including 03/07/2014   as well as oncology consult note 03/02/2014.--And progress note 07/24/2014  Medications  have been reviewed per [facility] . This includes Keppra 250 mg every morning 750 mg daily at bedtime.  Protonix 40 mg daily.  Norvasc 2.5 mg daily.  Multivitamin daily.  Humalog insulin 5 units subcutaneous at lunch for CBG greater than 75 also 5 units at dinner for CBG greater than 250  Coumadin 5 mg by mouth daily. .  Lantus insulin 22 units subcutaneous daily at bedtime.  Coreg 12.5 mg twice a day.  Lexapro 10 mg by mouth daily  Review of systems.--Obtained largely from nursing patient is largely nonverbal although he does speak minimally this is not new  In general no fevers been noted although appears his temperature is mildly elevated this evening  Respiratoryno shortness breath or cough.  Cardiac no chest pain or significant lower extremity edema.  GI does not complain of abdominal discomfort nausea or vomiting diarrhea constipation.  Muscle skeletal is not complaining of joint pain  GU-bladder issues as noted above but he does not complain of dysuria   Neurologic does not complain of headache or dizziness he does have a history of possible seizures--and now possibly increased tremors.  Psychiatric no overt anxiety or sadness.  Physical exam. Temperature 99.9 pulse 100 respirations 18 blood pressure 132/80 O2 saturation is in the 90s on room air blood sugars 400 although this is somewhat variable blood sugars are elevated more later in the day  In general this is a pleasant elderly male in no distress resting comfortably in bed--per nursing  had upper extremity tremors earlier-when I got to  the room these were minimal and when I rechecked him they appear to be essentially gone.  His skin is warm and dry. Do not really appreciate increased warmth  Head is normocephalic atraumatic.  Oropharynx has some dried food residue on tongue he had been eating-otherwise within normal limits  Chest is clear to auscultation no labored breathing.-he has somewhat poor respiratory  effort but is following verbal commands  Heart is regular rate and rhythm murmur gallop or rub borderline tachycardic at 100 he has minimal lower extremity edema.  GI abdomen soft nontender positive bowel sounds. Muscle skeletal moves all extremities 4 with some mild right-sided weakness although this appears to be baseline-I do not see any deformities--- appears to move his extremities at baseline-he does not always follow verbal commands   To move extremities--which makes exam somewhat challenging--but this is relatively baseline with previous exams.  Neurologic cranial nerves grossly intact tongue midline again possibly some mild right-sided weakness compared to the left. this is not new-  Psych he is alert and responsive-speaks a bit-but quite minimal which is not new-he is making eye contact  Labs.  07/24/2014.  WBC 8.2 hemoglobin 11.7 weight was 161.  Sodium 145 potassium 3.8 BUN 28 creatinine 2.2.  Liver function tests within normal limits.  Hemoglobin A1c 9.5  04/30/2014.  WBC 4.9 hemoglobin 9.3 platelets 285.  F 19 2016.  Sodium 142 potassium 3.8 UA 19 creatinine 1.9-albumen 3.1 otherwise liver function tests within normal limits.  WBC 4.7 hemoglobin 8.0 platelets 288  01/01/2014.  Sodium 147 potassium 4.2 BUN 25 and 2.52.  12/12/2013   sodium 146 potassium 3.9 BUN 31 creatinine 2.1.  WBC 4.9 hemoglobin 12.5 platelets 204  12/09/2013.  Sodium 141 potassium 4.9 BUN 35 creatinine 2.22.  Liver function tests within normal limits.   Assessment and plan  #1-intermittent tremors-again this appears to have resolved by the time I saw him but apparently has been having these more frequently--apparently number of absence seizures has increased as well as noted above.  Again I think it is important to get a CT scan of his head which has been ordered we will emphasize importance of trying to get this as soon as possible f.  Also will update his labs including a  metabolic panel CBC and a TSH-labsw essentially within normal range when ordered earlier this month but will recheck this.  He also with low-grade temperature will order a urinalysis and culture as well as a chest x-ray rule out any possibility of aspiration.  I would also like speech therapy to reevaluate him   This point monitor vital signs pulse ox neuro checks every 4 hours 2 and then every shift-again he appears essentially at his baseline when I reevaluated him  2-history t seizure--continues on Keppra twice a day 2 50 in the morning 750 at night  .  #3 normal pressure hydrocephalus-apparently this is been stable per  most recent MRI.     #4 history of pulmonary embolism-he is on chronic Coumadin most recent INR was therapeutic update INR is pending.  5History of bladder mass-This is followed by urology Is status post radiation for palliative reasons-not a surgical candidate or candidate for chemotherapy per oncology-nonetheless he appears to be stable there has been no recent bleeding--again will update a CT scan of the head to rule out metastasis  #6-diabetes type 2-he has variable blood sugars he is on Lantus 22 units daily at bedtime and  on Humalog 5 units before meals with different parameters if  Greater than 175 at lunch and breakfast he gets 5 units if above 250 at dinner he gets 5 units.  CBGs in the morning run from 77-322 appears to baseline is more in the higher 100s-at 11 AM appears to baseline also is in the higher 100s to lower 200s-at 4:30 mainly in the 2-300 range although mainly in the 200s-again there is quite a bit of variability at this point since he did not eat very well this evening would be hesitant to be aggressive will monitor secondary to variability         [id number]--of note greater than 35 minutes spent assessing patient-discussing his status with nursing staff-re-assessing patient-and coordinating and formulating a plan of care-of note greater than  50% of time spent coordinating plan of care      .   Marland Kitchen

## 2014-08-03 ENCOUNTER — Encounter: Payer: Self-pay | Admitting: Internal Medicine

## 2014-08-03 ENCOUNTER — Non-Acute Institutional Stay (SKILLED_NURSING_FACILITY): Payer: Medicare Other | Admitting: Internal Medicine

## 2014-08-03 DIAGNOSIS — R739 Hyperglycemia, unspecified: Secondary | ICD-10-CM

## 2014-08-03 DIAGNOSIS — J189 Pneumonia, unspecified organism: Secondary | ICD-10-CM | POA: Diagnosis not present

## 2014-08-03 DIAGNOSIS — Z7901 Long term (current) use of anticoagulants: Secondary | ICD-10-CM | POA: Diagnosis not present

## 2014-08-03 NOTE — Progress Notes (Signed)
MRN: [id number] Name: Noah Torres  Sex: male Age: 74 y.o. DOB: 02-14-1941  [place] [facility]: [facility] Facility/Room:422 Level Of Care: SNF Provider: Inocencio Homes D Emergency Contacts: Extended Emergency Contact Information Primary Emergency Contact: [place],  [place] Home Phone: [phone] Relation: None Secondary Emergency Contact: Jones,[name] Address: [address]          [place], [place] [address] [name] of [place] Phone: [phone] Relation: Other  Code Status   Allergies: Metformin and related  Chief Complaint  Patient presents with  . Acute Visit    HPI: Patient is 74 y.o. male who is being seen for hyperglycemia and uncontrolled INR today.  Past Medical History  Diagnosis Date  . Diabetes mellitus, type 2   . Hypercholesterolemia   . Former smoker   . Bilateral hearing loss     Wears hearing aids  . Chronic kidney disease   . Hearing loss     uses hearing aids  . Seizures 09/13/2013    Past Surgical History  Procedure Laterality Date  . Spine surgery        Medication List       This list is accurate as of: 08/03/14  7:01 PM.  Always use your most recent med list.               amLODipine 2.5 MG tablet  Commonly known as:  NORVASC  Take 2.5 mg by mouth daily.     carvedilol 12.5 MG tablet  Commonly known as:  COREG  Place 1 tablet (12.5 mg total) into feeding tube 2 (two) times daily with a meal.     escitalopram 10 MG tablet  Commonly known as:  LEXAPRO  Take 10 mg by mouth daily. For depression     ferrous sulfate 325 (65 FE) MG tablet  Take 325 mg by mouth 2 (two) times daily with a meal.     insulin aspart 100 UNIT/ML injection  Commonly known as:  novoLOG  Inject 5 Units into the skin 3 (three) times daily before meals. Give 5 units prior to breakfast and lunch for CBG greater than 175 Give 5 units prior to dinner for CBG greater than 250     insulin glargine 100 UNIT/ML injection  Commonly known as:   LANTUS  Inject 22 Units into the skin at bedtime.     insulin regular 100 units/mL injection  Commonly known as:  NOVOLIN R,HUMULIN R  Inject 12 Units into the skin as needed for high blood sugar. Give 12 units SQ as needed for blood sugar over 401.     levETIRAcetam 500 MG tablet  Commonly known as:  KEPPRA  Take 500 mg by mouth 2 (two) times daily. Takeone half tab to equal 250 mg every morning   Take 1-1/2 tablets to equal 750 mg daily at bedtime     meclizine 25 MG tablet  Commonly known as:  ANTIVERT  Take 25 mg by mouth 2 (two) times daily.     pantoprazole sodium 40 mg/20 mL Pack  Commonly known as:  PROTONIX  Place 20 mLs (40 mg total) into feeding tube daily.     warfarin 7.5 MG tablet  Commonly known as:  COUMADIN  Take 5 mg by mouth daily at 6 PM.        No orders of the defined types were placed in this encounter.    Immunization History  Administered Date(s) Administered  .  Influenza-Unspecified 11/29/2012, 12/15/2013  . PPD Test 01/21/2012    History  Substance Use Topics  . Smoking status: Former Research scientist (life sciences)  . Smokeless tobacco: Not on file  . Alcohol Use: No    Review of Systems  DATA OBTAINED: from nurse, pt's roommate GENERAL:  no fevers, pt stayed in bed today SKIN: No itching, rash HEENT: No complaint RESPIRATORY: per roommate coughing a lot last few days   CARDIAC: No chest pain, palpitations, lower extremity edema  GI: No abdominal pain, No N/V/D or constipation, No heartburn or reflux  GU: No dysuria, frequency or urgency, or incontinence  MUSCULOSKELETAL: No unrelieved bone/joint pain NEUROLOGIC: No headache, dizziness  PSYCHIATRIC: No overt anxiety or sadness  Filed Vitals:   08/03/14 1818  BP: 138/93  Pulse: 70  Temp: 98 F (36.7 C)  Resp: 20    Physical Exam  GENERAL APPEARANCE: Alert, nonconversant, looks sick, not toxic  SKIN: No diaphoresis rash HEENT: Unremarkable RESPIRATORY: Breathing is even, unlabored. Lung sounds  are slt rale on R, no wheezes   CARDIOVASCULAR: Heart RRR no murmurs, rubs or gallops. No peripheral edema  GASTROINTESTINAL: Abdomen is soft, non-tender, not distended w/ normal bowel sounds.  GENITOURINARY: Bladder non tender, not distended  MUSCULOSKELETAL: No abnormal joints or musculature NEUROLOGIC: Cranial nerves 2-12 grossly intact. PSYCHIATRIC: quiet no behavioral issues  Patient Active Problem List   Diagnosis Date Noted  . Hyperglycemia 08/03/2014  . HCAP (healthcare-associated pneumonia) 08/03/2014  . Occasional tremors 08/02/2014  . Type 2 diabetes with nephropathy 01/02/2014  . Mass of bladder 12/11/2013  . Pain in joint, lower leg 11/12/2013  . DVT (deep venous thrombosis) 11/09/2013  . Seizures 11/09/2013  . Altered mental status 10/16/2013  . Weakness 10/16/2013  . New onset seizure 09/13/2013  . Dizziness 07/19/2013  . Fever presenting with conditions classified elsewhere 04/05/2013  . Cough 04/05/2013  . Type II or unspecified type diabetes mellitus with renal manifestations, uncontrolled 04/01/2013  . Thrush 03/01/2013  . Anemia, iron deficiency 09/22/2012  . Secondary renovascular hypertension, benign 07/26/2012  . Diabetes mellitus type 2 with complications 09/32/6712  . GERD (gastroesophageal reflux disease) 07/04/2012  . Chronic anticoagulation 07/04/2012  . Anxiety state, unspecified 07/04/2012  . Essential hypertension, benign 07/04/2012  . Type II or unspecified type diabetes mellitus without mention of complication, not stated as uncontrolled 07/04/2012  . Esophageal reflux 07/04/2012  . Other pulmonary embolism and infarction 07/04/2012  . Long term current use of anticoagulant therapy 05/09/2012  . HTN (hypertension) 01/21/2012  . DM type 2 (diabetes mellitus, type 2) 01/21/2012  . Urinary retention 01/21/2012  . Encephalopathy 01/09/2012  . Normal pressure hydrocephalus 01/09/2012  . Acute respiratory failure 01/04/2012  . PE (pulmonary  thromboembolism) 01/04/2012  . Cardiac arrest 01/04/2012  . Acute renal failure 01/04/2012    CBC    Component Value Date/Time   WBC 4.9 04/30/2014   WBC 5.1 01/21/2012 0720   RBC 3.72* 01/21/2012 0720   HGB 9.3* 04/30/2014   HCT 30* 04/30/2014   PLT 285 04/30/2014   MCV 81.5 01/21/2012 0720    CMP     Component Value Date/Time   NA 142 04/06/2014   NA 139 01/21/2012 0720   K 3.8 04/06/2014   CL 108 01/21/2012 0720   CO2 18* 01/21/2012 0720   GLUCOSE 185* 01/21/2012 0720   BUN 19 04/06/2014   BUN 53* 01/21/2012 0720   CREATININE 1.9* 04/06/2014   CREATININE 4.16* 01/21/2012 0720   CALCIUM 8.5 01/21/2012 0720  PROT 7.7 01/15/2012 0440   ALBUMIN 2.1* 01/21/2012 0720   AST 15 04/06/2014   ALT 10 04/06/2014   ALKPHOS 79 04/06/2014   BILITOT 0.4 01/15/2012 0440   GFRNONAA 13* 01/21/2012 0720   GFRAA 15* 01/21/2012 0720    Assessment and Plan  Hyperglycemia BS in high 3 and 4 hundreds in past few days. This is his presentation for an infection. Have written for SSI to cover while pt is sick.  HCAP (healthcare-associated pneumonia) New cough, no fever, new hyperglycemia; CXR with very poor aeration, possible RUL infiltrate;pt is on coumadin, rocephin 1 gm IM daily for 7 days.  Long term current use of anticoagulant therapy Pt went from fully controlled ,INR's in 2's to INR 5.3 today, 5.2 on repeat. Follow until back to controlled. Cause?    Hennie Duos, MD

## 2014-08-03 NOTE — Assessment & Plan Note (Addendum)
BS in high 3 and 4 hundreds in past few days. This is his presentation for an infection. Have written for SSI to cover while pt is sick.

## 2014-08-03 NOTE — Assessment & Plan Note (Signed)
Pt went from fully controlled ,INR's in 2's to INR 5.3 today, 5.2 on repeat. Follow until back to controlled. Cause?

## 2014-08-03 NOTE — Assessment & Plan Note (Signed)
New cough, no fever, new hyperglycemia; CXR with very poor aeration, possible RUL infiltrate;pt is on coumadin, rocephin 1 gm IM daily for 7 days.

## 2014-08-10 ENCOUNTER — Non-Acute Institutional Stay (SKILLED_NURSING_FACILITY): Payer: Medicare Other | Admitting: Internal Medicine

## 2014-08-10 ENCOUNTER — Encounter: Payer: Self-pay | Admitting: Internal Medicine

## 2014-08-10 DIAGNOSIS — R569 Unspecified convulsions: Secondary | ICD-10-CM

## 2014-08-10 DIAGNOSIS — E872 Acidosis, unspecified: Secondary | ICD-10-CM | POA: Insufficient documentation

## 2014-08-10 DIAGNOSIS — J189 Pneumonia, unspecified organism: Secondary | ICD-10-CM

## 2014-08-10 DIAGNOSIS — E870 Hyperosmolality and hypernatremia: Secondary | ICD-10-CM

## 2014-08-10 DIAGNOSIS — N184 Chronic kidney disease, stage 4 (severe): Secondary | ICD-10-CM

## 2014-08-10 NOTE — Assessment & Plan Note (Signed)
Baseline Cr is 3.0; it was 3.5 on admission and 2.42 on d/c after vigorous hydration

## 2014-08-10 NOTE — Assessment & Plan Note (Signed)
165 on admission, 145 on d/c from hospital.

## 2014-08-10 NOTE — Assessment & Plan Note (Signed)
Has been without since on Keppra;cont same dose

## 2014-08-10 NOTE — Progress Notes (Signed)
MRN: [id number] Name: Noah Torres  Sex: male Age: 74 y.o. DOB: 04-Sep-1940  [place] #: [facility] Facility/Room:422 Level Of Care: SNF Provider: Inocencio Homes D Emergency Contacts: Extended Emergency Contact Information Primary Emergency Contact: Donald,  [place] Home Phone: [phone] Relation: None Secondary Emergency Contact: Jones,[name] Address: [address]          [place], [place] [address] [name] of [place] Phone: [phone] Relation: Other  Code Status: FULL  Allergies: Metformin and related  Chief Complaint  Patient presents with  . New Admit To SNF    HPI: Patient is 74 y.o. male who is admitted to SNF after being hospitalized for PNA, CKD and hypernatremia/dehydration.  Past Medical History  Diagnosis Date  . Diabetes mellitus, type 2   . Hypercholesterolemia   . Former smoker   . Bilateral hearing loss     Wears hearing aids  . Chronic kidney disease   . Hearing loss     uses hearing aids  . Seizures 09/13/2013    Past Surgical History  Procedure Laterality Date  . Spine surgery        Medication List       This list is accurate as of: 08/10/14  2:07 PM.  Always use your most recent med list.               amLODipine 2.5 MG tablet  Commonly known as:  NORVASC  Take 2.5 mg by mouth daily.     carvedilol 12.5 MG tablet  Commonly known as:  COREG  Place 1 tablet (12.5 mg total) into feeding tube 2 (two) times daily with a meal.     escitalopram 10 MG tablet  Commonly known as:  LEXAPRO  Take 10 mg by mouth daily. For depression     ferrous sulfate 325 (65 FE) MG tablet  Take 325 mg by mouth 2 (two) times daily with a meal.     FLORASTOR 250 MG capsule  Generic drug:  saccharomyces boulardii  Take 250 mg by mouth 2 (two) times daily.     insulin aspart 100 UNIT/ML injection  Commonly known as:  novoLOG  Inject 5 Units into the skin 3 (three) times daily before meals. Give 5 units prior to breakfast and  lunch for CBG greater than 175 Give 5 units prior to dinner for CBG greater than 250     insulin glargine 100 UNIT/ML injection  Commonly known as:  LANTUS  Inject 22 Units into the skin at bedtime.     insulin regular 100 units/mL injection  Commonly known as:  NOVOLIN R,HUMULIN R  Inject 12 Units into the skin as needed for high blood sugar. Give 12 units SQ as needed for blood sugar over 401.     levETIRAcetam 500 MG tablet  Commonly known as:  KEPPRA  Take 500 mg by mouth 2 (two) times daily. Takeone half tab to equal 250 mg every morning   Take 1-1/2 tablets to equal 750 mg daily at bedtime     levofloxacin 500 MG tablet  Commonly known as:  LEVAQUIN  Take 500 mg by mouth every other day. For 8 days - 4 doses     pantoprazole sodium 40 mg/20 mL Pack  Commonly known as:  PROTONIX  Place 20 mLs (40 mg total) into feeding tube daily.     sodium bicarbonate 650 MG tablet  Take 650 mg by mouth 2 (two) times  daily.     warfarin 7.5 MG tablet  Commonly known as:  COUMADIN  Take 5 mg by mouth daily at 6 PM.        Meds ordered this encounter  Medications  . levofloxacin (LEVAQUIN) 500 MG tablet    Sig: Take 500 mg by mouth every other day. For 8 days - 4 doses  . sodium bicarbonate 650 MG tablet    Sig: Take 650 mg by mouth 2 (two) times daily.  Marland Kitchen saccharomyces boulardii (FLORASTOR) 250 MG capsule    Sig: Take 250 mg by mouth 2 (two) times daily.    Immunization History  Administered Date(s) Administered  . Influenza-Unspecified 11/29/2012, 12/15/2013  . PPD Test 01/21/2012    History  Substance Use Topics  . Smoking status: Former Research scientist (life sciences)  . Smokeless tobacco: Not on file  . Alcohol Use: No    Family history is noncontributory    Review of Systems  DATA OBTAINED: from patient, nurse GENERAL:  no fevers,+ fatigue, appetite changes SKIN: No itching, rash EYES: No eye pain, redness, discharge EARS: No earache, tinnitus, change in hearing NOSE: No congestion,  drainage or bleeding  MOUTH/THROAT: No mouth or tooth pain, No sore throat RESPIRATORY: No cough, wheezing, SOB CARDIAC: No chest pain, palpitations, lower extremity edema  GI: No abdominal pain, No N/V/D or constipation, No heartburn or reflux  GU: No dysuria, frequency or urgency, or incontinence  MUSCULOSKELETAL: No unrelieved bone/joint pain NEUROLOGIC: No headache, dizziness or focal weakness PSYCHIATRIC: No overt anxiety or sadness, No behavior issue.   Filed Vitals:   08/10/14 1351  BP: 138/93  Pulse: 76  Temp: 97.4 F (36.3 C)  Resp: 18    Physical Exam  GENERAL APPEARANCE: Alert, min conversant,  No acute distress.  SKIN: No diaphoresis rash HEAD: Normocephalic, atraumatic  EYES: Conjunctiva/lids clear. Pupils round, reactive. EOMs intact.  EARS: External exam WNL, canals clear. Hearing grossly normal.  NOSE: No deformity or discharge.  MOUTH/THROAT: Lips w/o lesions  RESPIRATORY: Breathing is even, unlabored. Lung sounds are clear   CARDIOVASCULAR: Heart RRR no murmurs, rubs or gallops. No peripheral edema.   GASTROINTESTINAL: Abdomen is soft, non-tender, not distended w/ normal bowel sounds. GENITOURINARY: Bladder non tender, not distended  MUSCULOSKELETAL: No abnormal joints or musculature NEUROLOGIC:  Cranial nerves 2-12 grossly intact PSYCHIATRIC: pt very quiet, at his baseline, no behavioral issues  Patient Active Problem List   Diagnosis Date Noted  . CKD (chronic kidney disease) stage 4, GFR 15-29 ml/min 08/10/2014  . Metabolic acidosis 98/33/8250  . Hypernatremia 08/10/2014  . Hyperglycemia 08/03/2014  . HCAP (healthcare-associated pneumonia) 08/03/2014  . Occasional tremors 08/02/2014  . Type 2 diabetes with nephropathy 01/02/2014  . Mass of bladder 12/11/2013  . Pain in joint, lower leg 11/12/2013  . DVT (deep venous thrombosis) 11/09/2013  . Seizures 11/09/2013  . Altered mental status 10/16/2013  . Weakness 10/16/2013  . New onset seizure  09/13/2013  . Dizziness 07/19/2013  . Fever presenting with conditions classified elsewhere 04/05/2013  . Cough 04/05/2013  . Type II or unspecified type diabetes mellitus with renal manifestations, uncontrolled 04/01/2013  . Thrush 03/01/2013  . Anemia, iron deficiency 09/22/2012  . Secondary renovascular hypertension, benign 07/26/2012  . Diabetes mellitus type 2 with complications 53/97/6734  . GERD (gastroesophageal reflux disease) 07/04/2012  . Chronic anticoagulation 07/04/2012  . Anxiety state, unspecified 07/04/2012  . Essential hypertension, benign 07/04/2012  . Type II or unspecified type diabetes mellitus without mention of complication, not stated  as uncontrolled 07/04/2012  . Esophageal reflux 07/04/2012  . Other pulmonary embolism and infarction 07/04/2012  . Long term current use of anticoagulant therapy 05/09/2012  . HTN (hypertension) 01/21/2012  . DM type 2 (diabetes mellitus, type 2) 01/21/2012  . Urinary retention 01/21/2012  . Encephalopathy 01/09/2012  . Normal pressure hydrocephalus 01/09/2012  . Acute respiratory failure 01/04/2012  . PE (pulmonary thromboembolism) 01/04/2012  . Cardiac arrest 01/04/2012  . Acute renal failure 01/04/2012    CBC    Component Value Date/Time   WBC 4.9 04/30/2014   WBC 5.1 01/21/2012 0720   RBC 3.72* 01/21/2012 0720   HGB 9.3* 04/30/2014   HCT 30* 04/30/2014   PLT 285 04/30/2014   MCV 81.5 01/21/2012 0720    CMP     Component Value Date/Time   NA 142 04/06/2014   NA 139 01/21/2012 0720   K 3.8 04/06/2014   CL 108 01/21/2012 0720   CO2 18* 01/21/2012 0720   GLUCOSE 185* 01/21/2012 0720   BUN 19 04/06/2014   BUN 53* 01/21/2012 0720   CREATININE 1.9* 04/06/2014   CREATININE 4.16* 01/21/2012 0720   CALCIUM 8.5 01/21/2012 0720   PROT 7.7 01/15/2012 0440   ALBUMIN 2.1* 01/21/2012 0720   AST 15 04/06/2014   ALT 10 04/06/2014   ALKPHOS 79 04/06/2014   BILITOT 0.4 01/15/2012 0440   GFRNONAA 13* 01/21/2012 0720    GFRAA 15* 01/21/2012 0720    Assessment and Plan  HCAP (healthcare-associated pneumonia) Pt was teated with broad spectrum in hospital and d/c on levaquin 500 mg q 48 for 4 doses.  CKD (chronic kidney disease) stage 4, GFR 15-29 ml/min Baseline Cr is 3.0; it was 3.5 on admission and 2.42 on d/c after vigorous hydration  Metabolic acidosis 2/2 CKD, bicarb 650 BID  Hypernatremia 165 on admission, 145 on d/c from hospital.  Seizures Has been without since on Keppra;cont same dose    Hennie Duos, MD

## 2014-08-10 NOTE — Assessment & Plan Note (Signed)
Pt was teated with broad spectrum in hospital and d/c on levaquin 500 mg q 48 for 4 doses.

## 2014-08-10 NOTE — Assessment & Plan Note (Signed)
2/2 CKD, bicarb 650 BID

## 2014-08-12 ENCOUNTER — Encounter: Payer: Self-pay | Admitting: Internal Medicine

## 2014-10-08 IMAGING — XA IR PERC PLACEMENT GASTROSTOMY
1 series · 11 of 11 positions shown · non-contrast
Comparison: none

***ADDENDUM*** CREATED: 01/18/2012 [DATE]

Ancef 2 grams IV was given for the procedure.  As antibiotic
prophylaxis, Ancef was ordered pre-procedure and administered
intravenously within one hour of incision.
***END ADDENDUM*** SIGNED BY: [place], M.D.
CLINICAL HISTORY: Anoxic brain injury and malnutrition.

[Series 1: run · 11 of 11 slices shown]
[im 1/11]
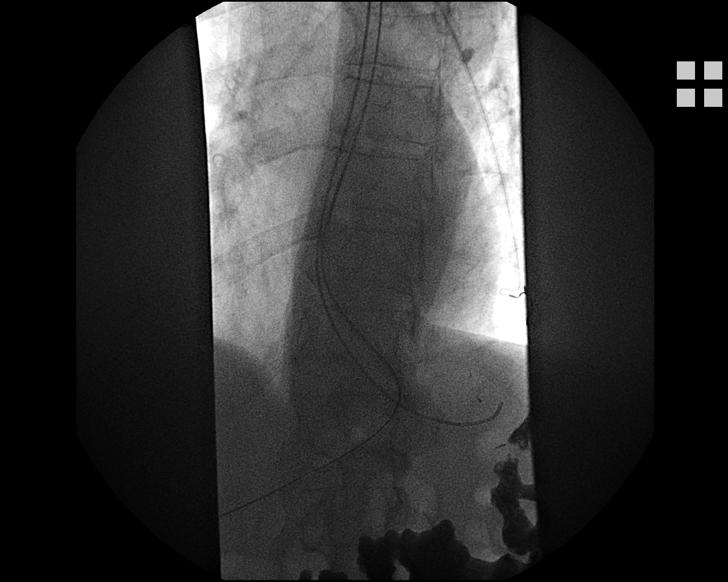
[im 2/11]
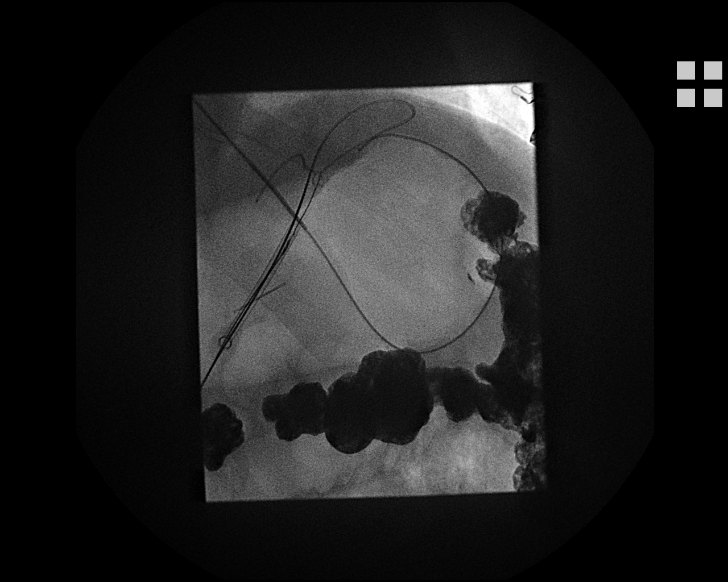
[im 3/11]
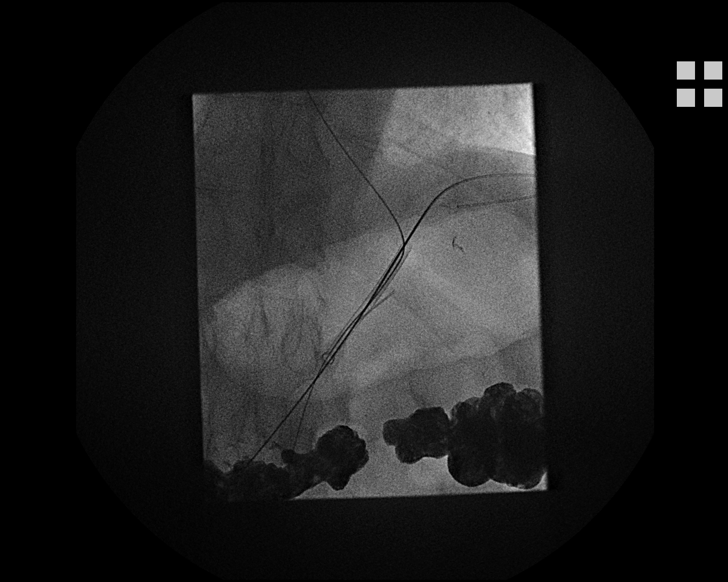
[im 4/11]
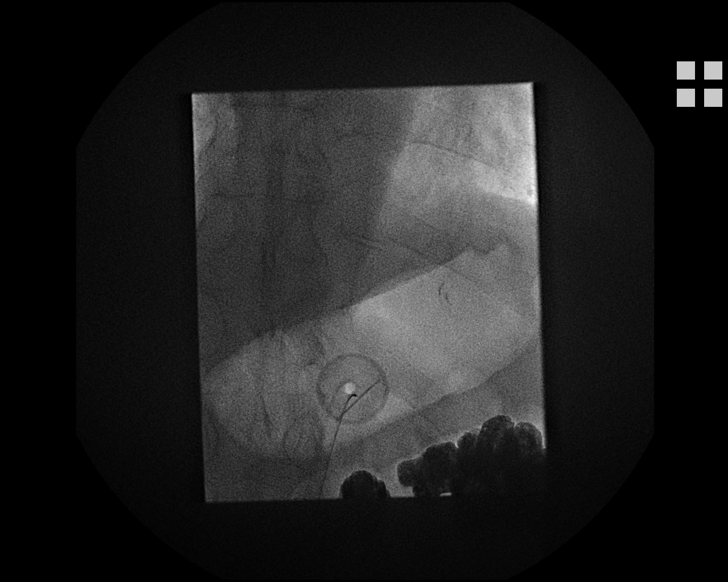
[im 5/11]
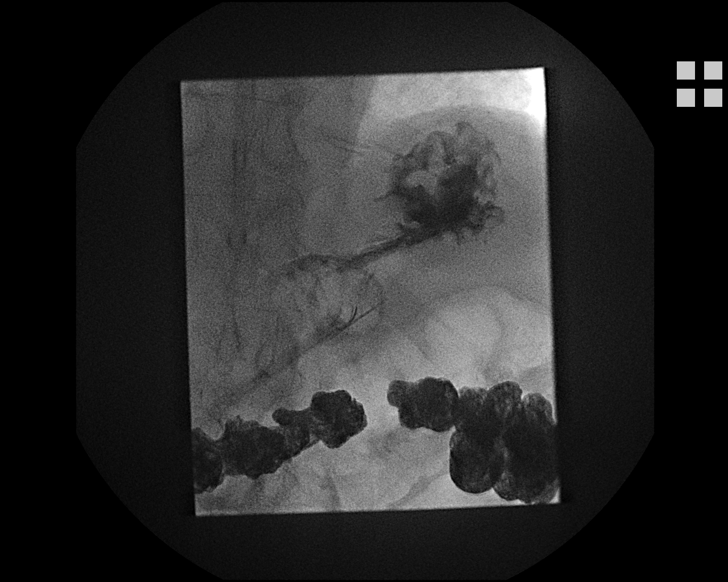
[im 6/11]
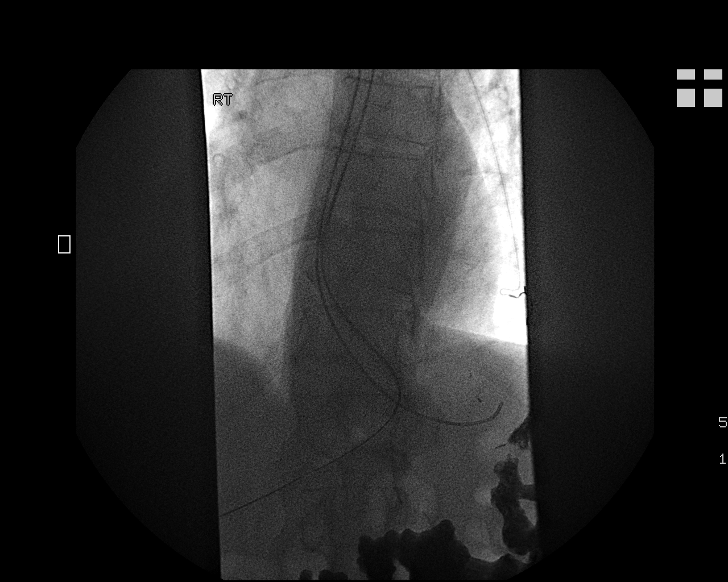
[im 7/11]
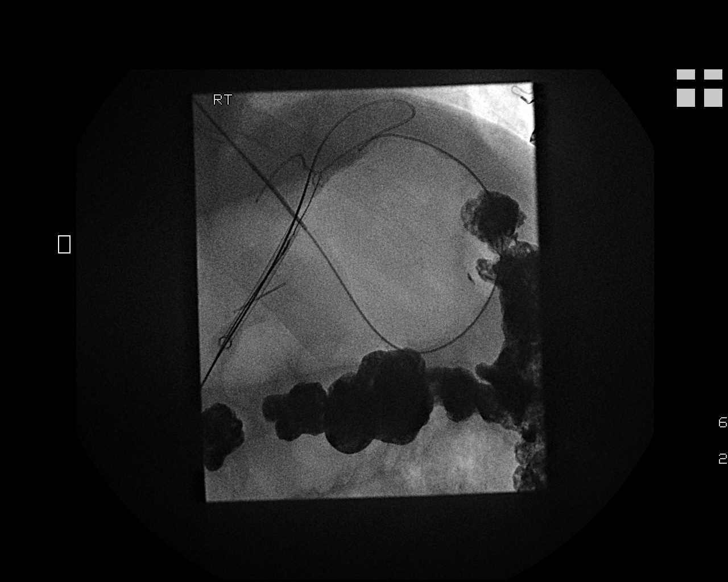
[im 8/11]
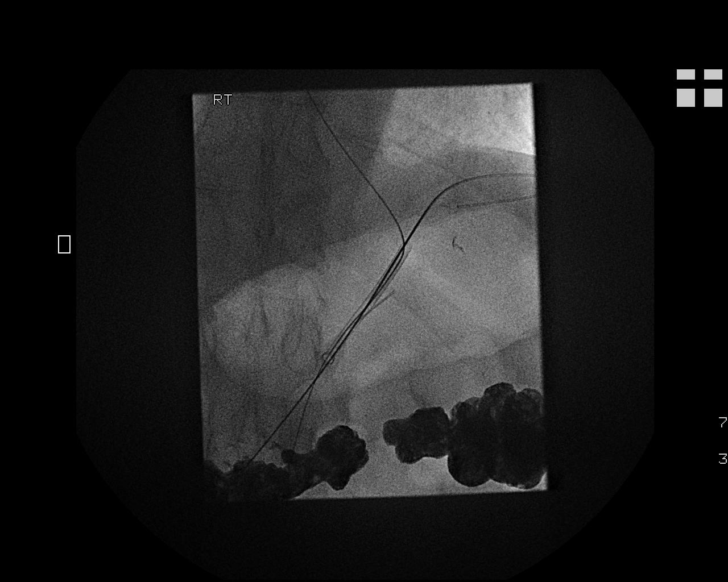
[im 9/11]
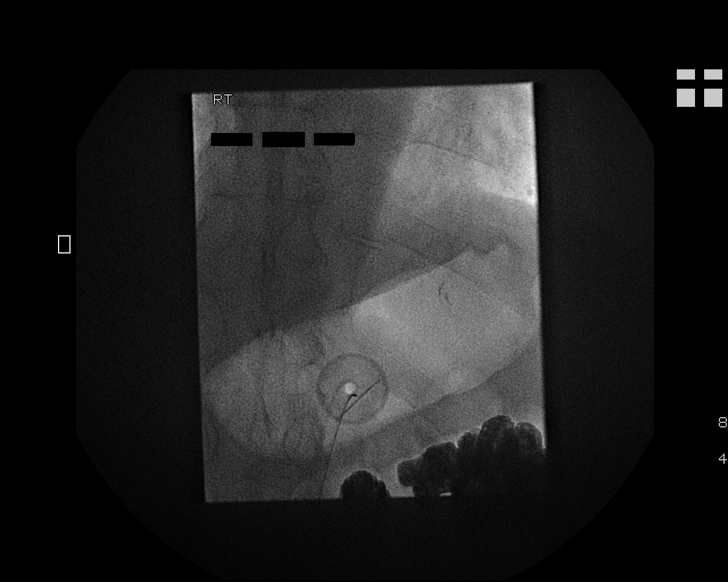
[im 10/11]
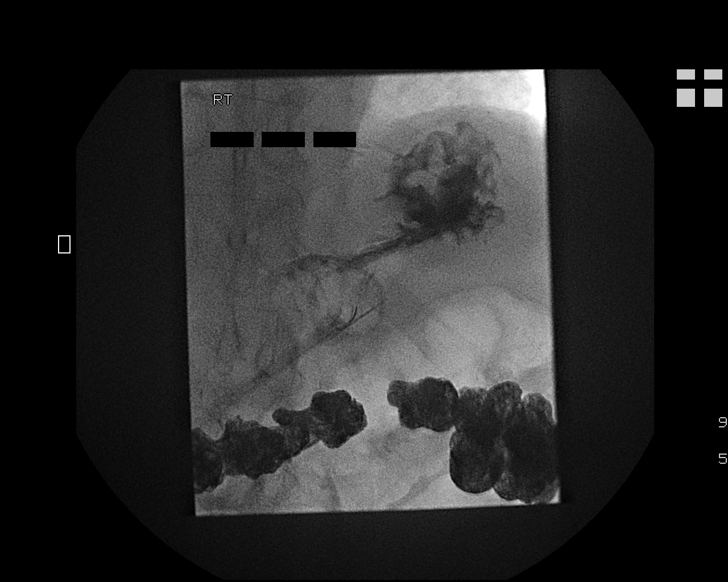
[im 11/11]
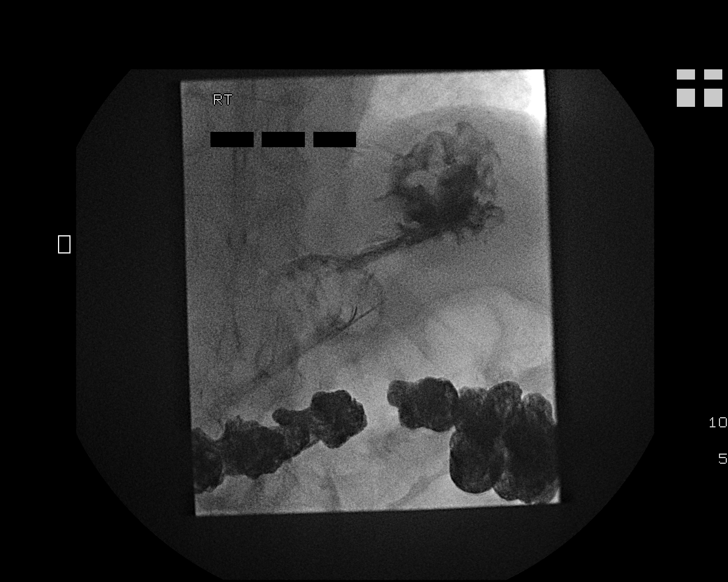

[11 of 11 positions shown; findings below may reference images not displayed]

PROCEDURE(S): PERCUTANEOUS GASTROSTOMY TUBE WITH FLUOROSCOPIC
GUIDANCE

Medications:Versed 1.00mg, Fentanyl 50 mcg. A radiology nurse
monitored the patient for moderate sedation.

Moderate sedation time:20 minutes

Fluoroscopy time: 2.6 minutes

Procedure:Informed consent was obtained for a percutaneous
gastrostomy tube.  The patient was placed on the interventional
table.  Fluoroscopy demonstrated oral contrast in the transverse
colon.  An orogastric tube was placed with fluoroscopic guidance.
The anterior abdomen was prepped and draped in sterile fashion.
Maximal barrier sterile technique was utilized including caps,
mask, sterile gowns, sterile gloves, sterile drape, hand hygiene
and skin antiseptic.  Stomach was inflated with air through the
orogastric tube.  The skin and subcutaneous tissues were
anesthetized with 1% lidocaine.  A 17 gauge needle was directed
into the distended stomach with fluoroscopic guidance.  A wire was
advanced into the stomach and Giano Onrust was deployed.  A 9-French
vascular sheath was placed and the orogastric tube was snared using
a Gooseneck snare device.  The orogastric tube and snare were
pulled out of the patient's mouth.  The snare device was connected
to a 20-French gastrostomy tube.  The snare device and gastrostomy
tube were pulled through the patient's mouth and out the anterior
abdominal wall.  The gastrostomy tube was cut to an appropriate
length.  Contrast injection through gastrostomy tube confirmed
placement within the stomach.  Fluoroscopic images were obtained
for documentation.  The gastrostomy tube was flushed with normal
saline.
FINDINGS: Gastrostomy tube within the stomach.

Complications: None
IMPRESSION: Successful fluoroscopic guided percutaneous gastrostomy
tube placement.

## 2014-11-29 ENCOUNTER — Non-Acute Institutional Stay (SKILLED_NURSING_FACILITY): Payer: Medicare Other | Admitting: Internal Medicine

## 2014-11-29 DIAGNOSIS — I2699 Other pulmonary embolism without acute cor pulmonale: Secondary | ICD-10-CM | POA: Diagnosis not present

## 2014-11-29 DIAGNOSIS — N184 Chronic kidney disease, stage 4 (severe): Secondary | ICD-10-CM

## 2014-11-29 DIAGNOSIS — N3289 Other specified disorders of bladder: Secondary | ICD-10-CM | POA: Diagnosis not present

## 2014-11-29 DIAGNOSIS — E1121 Type 2 diabetes mellitus with diabetic nephropathy: Secondary | ICD-10-CM | POA: Diagnosis not present

## 2014-11-29 DIAGNOSIS — K219 Gastro-esophageal reflux disease without esophagitis: Secondary | ICD-10-CM | POA: Diagnosis not present

## 2014-11-29 DIAGNOSIS — R569 Unspecified convulsions: Secondary | ICD-10-CM

## 2014-11-29 NOTE — Progress Notes (Signed)
Patient ID: Noah Torres, male   DOB: [birth year], 74 y.o.   MRN: [id number] MRN: [id number] Name: Noah Torres  Sex: male Age: 74 y.o. DOB: 1940/09/03  [facility] #: [facility] Facility/Room:422 Level Of Care: SNF Provider: Wille Celeste Emergency Contacts: Extended Emergency Contact Information Primary Emergency Contact: [place],  [place] Home Phone: [phone] Relation: None Secondary Emergency Contact: Jones,[name] Address: [address]          [place], [place] [address] [name] of [place] Phone: [phone] Relation: Other  Code Status: FULL  Allergies: Metformin and related  Chief Complaint  Patient presents with  . Medical Management of Chronic Issues    HPI: Patient is 74 y.o. male who iwas admitted to SNF after being hospitalized for PNA, CKD and hypernatremia/dehydration. Patient has had a complicated medical history but nonetheless appears to have stabilized.  He does have a history of suspected seizures is on Keppra twice a day.  Also a type II diabetic on Lantus 22 units daily as well as Humalog sliding scale insulin-blood sugars appear to be variable appears to be in the 100s generally in the a.m. later in the day  tends to get above 200 although there is some variability will update a hemoglobin A1c.  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  Today he has no acute complaints he is a poor historianand however nursing staff does not report any acute issues    Past Medical History  Diagnosis Date  . Diabetes mellitus, type 2 ([place])   . Hypercholesterolemia   . Former smoker   . Bilateral hearing loss     Wears hearing aids  . Chronic kidney disease   . Hearing  loss     uses hearing aids  . Seizures ([place]) 09/13/2013    Past Surgical History  Procedure Laterality Date  . Spine surgery        Medication List       This list is accurate as of: 11/29/14 11:59 PM.  Always use your most recent med list.               amLODipine 2.5 MG tablet  Commonly known as:  NORVASC  Take 2.5 mg by mouth daily.     carvedilol 12.5 MG tablet  Commonly known as:  COREG  Place 1 tablet (12.5 mg total) into feeding tube 2 (two) times daily with a meal.     escitalopram 10 MG tablet  Commonly known as:  LEXAPRO  Take 10 mg by mouth daily. For depression     ferrous sulfate 325 (65 FE) MG tablet  Take 325 mg by mouth 2 (two) times daily with a meal.     FLORASTOR 250 MG capsule  Generic drug:  saccharomyces boulardii  Take 250 mg by mouth 2 (two) times daily.     insulin aspart 100 UNIT/ML injection  Commonly known as:  novoLOG  Inject 5 Units into the skin 3 (three) times daily before meals. Give 5 units prior to breakfast and lunch for CBG greater than 175 Give 5 units prior to dinner for CBG greater than 250     insulin glargine 100 UNIT/ML  injection  Commonly known as:  LANTUS  Inject 22 Units into the skin at bedtime.     insulin regular 100 units/mL injection  Commonly known as:  NOVOLIN R,HUMULIN R  Inject 12 Units into the skin as needed for high blood sugar. Give 12 units SQ as needed for blood sugar over 401.     levETIRAcetam 500 MG tablet  Commonly known as:  KEPPRA  Take 500 mg by mouth 2 (two) times daily. Takeone half tab to equal 250 mg every morning   Take 1-1/2 tablets to equal 750 mg daily at bedtime     levofloxacin 500 MG tablet  Commonly known as:  LEVAQUIN  Take 500 mg by mouth every other day. For 8 days - 4 doses     pantoprazole sodium 40 mg/20 mL Pack  Commonly known as:  PROTONIX  Place 20 mLs (40 mg total) into feeding tube daily.     sodium bicarbonate 650 MG tablet  Take 650 mg by mouth 2 (two) times  daily.     warfarin 7.5 MG tablet  Commonly known as:  COUMADIN  Take 5 mg by mouth daily at 6 PM.        No orders of the defined types were placed in this encounter.    Immunization History  Administered Date(s) Administered  . Influenza-Unspecified 11/29/2012, 12/15/2013  . PPD Test 01/21/2012    Social History  Substance Use Topics  . Smoking status: Former Research scientist (life sciences)  . Smokeless tobacco: Not on file  . Alcohol Use: No    Family history is noncontributory    Review of Systems  DATA OBTAINED: from patient, nurse GENERAL:  no fevers, fatigue, appetite changes SKIN: No itching, rash EYES: No eye pain, redness, discharge EARS: No earache, tinnitus, change in hearing NOSE: No congestion, drainage or bleeding  MOUTH/THROAT: No mouth or tooth pain, No sore throat RESPIRATORY: No cough, wheezing, SOB CARDIAC: No chest pain, palpitations, lower extremity edema  GI: No abdominal pain, No N/V/D or constipation, No heartburn or reflux  GU: No dysuria, frequency or urgency, or incontinence  MUSCULOSKELETAL: No unrelieved bone/joint pain NEUROLOGIC: No headache, dizziness or focal weakness PSYCHIATRIC: No overt anxiety or sadness, No behavior issue.   Filed Vitals:   12/08/14 1202  BP: 130/73  Pulse: 78  Temp: 97.6 F (36.4 C)  Resp: 18    Physical Exam  GENERAL APPEARANCE: Alert, somewhat conversant,  No acute distress.  SKIN: No diaphoresis rash HEAD: Normocephalic, atraumatic  EYES: Conjunctiva/lids clear. Pupils round, reactive. EOMs intact.  EARS: External exam WNL, canals clear. Hearing grossly normal.  NOSE: No deformity or discharge.  MOUTH/THROAT: Oropharynx clear mucous membranes moist RESPIRATORY: Breathing is even, unlabored. Lung sounds are clear   CARDIOVASCULAR: Heart RRR no murmurs, rubs or gallops. minimal peripheral edema.   GASTROINTESTINAL: Abdomen is soft, non-tender, not distended w/ normal bowel sounds.  MUSCULOSKELETAL: No abnormal joints  or musculature general frailty and weakness but is able to move all extremities 4 with mild right sided weakness which is baseline NEUROLOGIC:  Cranial nerves 2-12 grossly intact--history of mild right-sided weakness but this is been stable PSYCHIATRIC: pt , at his baseline, no behavioral issues--a bit more talkative than I seen in the past  Patient Active Problem List   Diagnosis Date Noted  . CKD (chronic kidney disease) stage 4, GFR 15-29 ml/min (HCC) 08/10/2014  . Metabolic acidosis 62/22/9798  . Hypernatremia 08/10/2014  . Hyperglycemia 08/03/2014  . HCAP (healthcare-associated pneumonia) 08/03/2014  .  Occasional tremors 08/02/2014  . Type 2 diabetes with nephropathy ([place]) 01/02/2014  . Mass of bladder 12/11/2013  . Pain in joint, lower leg 11/12/2013  . DVT (deep venous thrombosis) ([place]) 11/09/2013  . Seizures ([place]) 11/09/2013  . Altered mental status 10/16/2013  . Weakness 10/16/2013  . New onset seizure ([place]) 09/13/2013  . Dizziness 07/19/2013  . Fever presenting with conditions classified elsewhere 04/05/2013  . Cough 04/05/2013  . Type II or unspecified type diabetes mellitus with renal manifestations, uncontrolled 04/01/2013  . Thrush 03/01/2013  . Anemia, iron deficiency 09/22/2012  . Secondary renovascular hypertension, benign 07/26/2012  . Diabetes mellitus type 2 with complications ([place]) 95/10/3265  . GERD (gastroesophageal reflux disease) 07/04/2012  . Chronic anticoagulation 07/04/2012  . Anxiety state, unspecified 07/04/2012  . Essential hypertension, benign 07/04/2012  . Type II or unspecified type diabetes mellitus without mention of complication, not stated as uncontrolled 07/04/2012  . Esophageal reflux 07/04/2012  . Other pulmonary embolism and infarction 07/04/2012  . Long term current use of anticoagulant therapy 05/09/2012  . HTN (hypertension) 01/21/2012  . DM type 2 (diabetes mellitus, type 2) ([place]) 01/21/2012  . Urinary retention 01/21/2012  .  Encephalopathy 01/09/2012  . Normal pressure hydrocephalus 01/09/2012  . Acute respiratory failure ([place]) 01/04/2012  . PE (pulmonary thromboembolism) ([place]) 01/04/2012  . Cardiac arrest ([place]) 01/04/2012  . Acute renal failure ([place]) 01/04/2012     Labs. 08/09/2014.  WBC 5.4 hemoglobin 9.2 platelets 208.  Sodium 145 potassium 3.4 BUN 28 creatinine 2.42.  Other phosphatase 131 albumin 2.5 otherwise liver function tests within normal limits.  07/24/2014.  WBC 8.2 hemoglobin 11.7 platelets 161.  Sodium 145 potassium 3.8 BUN 28 creatinine 2.2.  Hemoglobin A1c-9.5.   CBC    Component Value Date/Time   WBC 4.9 04/30/2014   WBC 5.1 01/21/2012 0720   RBC 3.72* 01/21/2012 0720   HGB 9.3* 04/30/2014   HCT 30* 04/30/2014   PLT 285 04/30/2014   MCV 81.5 01/21/2012 0720    CMP     Component Value Date/Time   NA 142 04/06/2014   NA 139 01/21/2012 0720   K 3.8 04/06/2014   CL 108 01/21/2012 0720   CO2 18* 01/21/2012 0720   GLUCOSE 185* 01/21/2012 0720   BUN 19 04/06/2014   BUN 53* 01/21/2012 0720   CREATININE 1.9* 04/06/2014   CREATININE 4.16* 01/21/2012 0720   CALCIUM 8.5 01/21/2012 0720   PROT 7.7 01/15/2012 0440   ALBUMIN 2.1* 01/21/2012 0720   AST 15 04/06/2014   ALT 10 04/06/2014   ALKPHOS 79 04/06/2014   BILITOT 0.4 01/15/2012 0440   GFRNONAA 13* 01/21/2012 0720   GFRAA 15* 01/21/2012 0720    Assessment and Plan   #1-[id number] of metabolic acidosis S this has been stable will update a basic metabolic panel he continues on sodium bicarbonate.  #2 history seizure disorder this has been stable on some time on Keppra.  #3 history of chronic kidney disease most recent creatinine 2.42 BUN 28 will update this.  #4 history of pneumonia this was treated in the hospital there has been no recurrence although I suspect he remains at risk.  #5 history of diabetes again variable blood sugars as noted above will update a hemoglobin A1c currently on Lantus as well as  Humalog as needed.  #6 history of hypertension-this appears stable he is on Coreg 12.5 mg twice a day as well as Norvasc 2.5 mg a day recent blood pressures 130/73-124/72.  #7 history of anemia  most likely chronic disease he is on iron twice a day Will update a hemoglobin last hemoglobin noted in chart 9.2 previous one was 11.7 would like to see where we stand here.  ,  #8 history of pulmonary embolism he is on chronic Coumadin most recent INR 1.7 Dr. Sheppard Coil has ordered an update I do not see any evidence of increased bruising or bleeding.  History of GERD this appears stable he is on Protonix.  #10 history of bladder cancer as noted above not thought to be an aggressive candidate for chemotherapy he has received palliative radiation in the past-despite this he appears clinically to be stable he does not appear to be in discomfort or pain he is actually more talkative today than I have seen him in a while.  #11-history of NPH mentally he appears to be at baseline today actually somewhat improved--more talkative-most recent MRI apparently showed stability.  [id number] note and 35 minutes spent assessing patient-discussing his status with nursing staff-reviewing his chart-and coordinating and formulating a plan of care for numerous diagnoses-of note greater than 50% of time spent coordinating plan of care

## 2014-12-08 ENCOUNTER — Encounter: Payer: Self-pay | Admitting: Internal Medicine

## 2015-02-07 ENCOUNTER — Non-Acute Institutional Stay (SKILLED_NURSING_FACILITY): Payer: Medicare Other | Admitting: Internal Medicine

## 2015-02-07 ENCOUNTER — Encounter: Payer: Self-pay | Admitting: Internal Medicine

## 2015-02-07 DIAGNOSIS — I2699 Other pulmonary embolism without acute cor pulmonale: Secondary | ICD-10-CM

## 2015-02-07 DIAGNOSIS — E1121 Type 2 diabetes mellitus with diabetic nephropathy: Secondary | ICD-10-CM

## 2015-02-07 DIAGNOSIS — N184 Chronic kidney disease, stage 4 (severe): Secondary | ICD-10-CM | POA: Diagnosis not present

## 2015-02-07 DIAGNOSIS — G912 (Idiopathic) normal pressure hydrocephalus: Secondary | ICD-10-CM | POA: Diagnosis not present

## 2015-02-07 DIAGNOSIS — I10 Essential (primary) hypertension: Secondary | ICD-10-CM | POA: Diagnosis not present

## 2015-02-07 NOTE — Progress Notes (Signed)
Patient ID: Gaylard Hitt, male   DOB: Apr 21, 1940, 74 y.o.   MRN: [id number]  MRN: [id number] Name: Noah Torres  Sex: male Age: 74 y.o. DOB: Jan 08, 1941  [facility]: [facility] Facility/Room:422 Level Of Care: SNF Provider: Wille Celeste Emergency Contacts: Extended Emergency Contact Information Primary Emergency Contact: [place],  [place] Home Phone: [phone] Relation: None Secondary Emergency Contact: Jones,[name] Address: [address]          [place], [place] [address] [name] of [place] Phone: [phone] Relation: Other  Code Status: FULL  Allergies: Metformin and related  Chief Complaint  Patient presents with  . Medical Management of Chronic Issues   including chronic kidney disease-history of NPH-seizure disorder-diabetes type 2-bladder carcinoma-hypertension-depression  HPI: Patient is 74 y.o. male who iwas admitted to SNF after being hospitalized for PNA, CKD and hypernatremia/dehydration. Patient has had a complicated medical history but nonetheless appears to have stabilized.  He does have a history of suspected seizures is on Keppra twice a day.  Also a type II diabetic on Lantus 22 units daily as well as Humalog sliding scale insulin-blood sugars appear to be variable appears to be in the 100s generally in the a.m. later in the day  tends to be more variable  In low 100's-occasionally  above 200 although there is some variability will update a hemoglobin A1c.  Patient also has a history of a bladder cancer this is considered inoperable he has had palliative radiation in the past-not thought to be a candidate for chemotherapy-nonetheless he appears to be stable in this regards  Patient does have a history of NPH-as well as a pulmonary embolism he is on chronic Coumadin.-INR is being monitored most recently 2.1 on December 18 update INR is pending  Today he has no acute complaints he is a poor historianand however nursing staff does  not report any acute issues--he actually appears to be more alert out of his bed more often it appears talks a bit more-    Past Medical History  Diagnosis Date  . Diabetes mellitus, type 2 ([place])   . Hypercholesterolemia   . Former smoker   . Bilateral hearing loss     Wears hearing aids  . Chronic kidney disease   . Hearing loss     uses hearing aids  . Seizures ([place]) 09/13/2013    Past Surgical History  Procedure Laterality Date  . Spine surgery        Medication List       This list is accurate as of: 02/07/15 11:59 PM.  Always use your most recent med list.               amLODipine 2.5 MG tablet  Commonly known as:  NORVASC  Take 2.5 mg by mouth daily.     carvedilol 12.5 MG tablet  Commonly known as:  COREG  Place 1 tablet (12.5 mg total) into feeding tube 2 (two) times daily with a meal.     escitalopram 10 MG tablet  Commonly known as:  LEXAPRO  Take 10 mg by mouth daily. For depression     ferrous sulfate 325 (65 FE) MG tablet  Take 325 mg by mouth 2 (two) times daily with a meal.     FLORASTOR 250 MG capsule  Generic drug:  saccharomyces boulardii  Take 250 mg by mouth 2 (two) times daily.     insulin aspart 100 UNIT/ML injection  Commonly  known as:  novoLOG  Inject 5 Units into the skin 3 (three) times daily before meals. Give 5 units prior to breakfast and lunch for CBG greater than 175 Give 5 units prior to dinner for CBG greater than 250     insulin glargine 100 UNIT/ML injection  Commonly known as:  LANTUS  Inject 22 Units into the skin at bedtime.     insulin regular 100 units/mL injection  Commonly known as:  NOVOLIN R,HUMULIN R  Inject 12 Units into the skin as needed for high blood sugar. Give 12 units SQ as needed for blood sugar over 401.     levETIRAcetam 500 MG tablet  Commonly known as:  KEPPRA  Take 500 mg by mouth 2 (two) times daily. Takeone half tab to equal 250 mg every morning   Take 1-1/2 tablets to equal 750 mg daily at  bedtime     pantoprazole sodium 40 mg/20 mL Pack  Commonly known as:  PROTONIX  Place 20 mLs (40 mg total) into feeding tube daily.     sodium bicarbonate 650 MG tablet  Take 650 mg by mouth 2 (two) times daily.     warfarin 7.5 MG tablet  Commonly known as:  COUMADIN  Take 5 mg by mouth daily at 6 PM.        No orders of the defined types were placed in this encounter.    Immunization History  Administered Date(s) Administered  . Influenza-Unspecified 11/29/2012, 12/15/2013  . PPD Test 01/21/2012    Social History  Substance Use Topics  . Smoking status: Former Research scientist (life sciences)  . Smokeless tobacco: Not on file  . Alcohol Use: No    Family history is noncontributory    Review of Systems  DATA OBTAINED: from patient, nurse GENERAL:  no fevers, fatigue, appetite changes SKIN: No itching, rash EYES: No eye pain, redness, discharge EARS: No earache, tinnitus, change in hearing NOSE: No congestion, drainage or bleeding  MOUTH/THROAT: No mouth or tooth pain, No sore throat RESPIRATORY: No cough, wheezing, SOB CARDIAC: No chest pain, palpitations, lower extremity edema  GI: No abdominal pain, No N/V/D or constipation, No heartburn or reflux  GU: No dysuria, frequency or urgency, or incontinence  MUSCULOSKELETAL: No unrelieved bone/joint pain NEUROLOGIC: No headache, dizziness or focal weakness PSYCHIATRIC: No overt anxiety or sadness, No behavior issue.   Filed Vitals:   02/07/15 1319  BP: 139/79  Pulse: 68  Temp: 97.7 F (36.5 C)  Resp: 18    Physical Exam  GENERAL APPEARANCE: Alert, somewhat conversant,  No acute distress.  SKIN: No diaphoresis rash HEAD: Normocephalic, atraumatic  EYES: Conjunctiva/lids clear. Pupils round, reactive. EOMs intact.  EARS: External exam WNL, canals clear. Hearing grossly normal.  NOSE: No deformity or discharge.  MOUTH/THROAT: Oropharynx clear mucous membranes moist RESPIRATORY: Breathing is even, unlabored. Lung sounds are clear    CARDIOVASCULAR: Heart RRR no murmurs, rubs or gallops. minimal peripheral edema.   GASTROINTESTINAL: Abdomen is soft, non-tender, not distended w/ normal bowel sounds.  MUSCULOSKELETAL: No abnormal joints or musculature general frailty and weakness but is able to move all extremities 4 with mild right sided weakness which is baseline NEUROLOGIC:  Cranial nerves 2-12 grossly intact--history of mild right-sided weakness but this is stable PSYCHIATRIC: pt , at his baseline, no behavioral issues--a bit more talkative   Patient Active Problem List   Diagnosis Date Noted  . CKD (chronic kidney disease) stage 4, GFR 15-29 ml/min (HCC) 08/10/2014  . Metabolic acidosis [id number]  .  Hypernatremia 08/10/2014  . Hyperglycemia 08/03/2014  . HCAP (healthcare-associated pneumonia) 08/03/2014  . Occasional tremors 08/02/2014  . Type 2 diabetes with nephropathy ([place]) 01/02/2014  . Mass of bladder 12/11/2013  . Pain in joint, lower leg 11/12/2013  . DVT (deep venous thrombosis) ([place]) 11/09/2013  . Seizures ([place]) 11/09/2013  . Altered mental status 10/16/2013  . Weakness 10/16/2013  . New onset seizure ([place]) 09/13/2013  . Dizziness 07/19/2013  . Fever presenting with conditions classified elsewhere 04/05/2013  . Cough 04/05/2013  . Type II or unspecified type diabetes mellitus with renal manifestations, uncontrolled 04/01/2013  . Thrush 03/01/2013  . Anemia, iron deficiency 09/22/2012  . Benign renovascular hypertension 07/26/2012  . Diabetes mellitus type 2 with complications ([place]) 0000000  . GERD (gastroesophageal reflux disease) 07/04/2012  . Chronic anticoagulation 07/04/2012  . Anxiety state, unspecified 07/04/2012  . Essential hypertension, benign 07/04/2012  . Type II or unspecified type diabetes mellitus without mention of complication, not stated as uncontrolled 07/04/2012  . Esophageal reflux 07/04/2012  . Other pulmonary embolism and infarction 07/04/2012  . Long term current use  of anticoagulant therapy 05/09/2012  . HTN (hypertension) 01/21/2012  . DM type 2 (diabetes mellitus, type 2) ([place]) 01/21/2012  . Urinary retention 01/21/2012  . Encephalopathy 01/09/2012  . Normal pressure hydrocephalus 01/09/2012  . Acute respiratory failure ([place]) 01/04/2012  . PE (pulmonary thromboembolism) ([place]) 01/04/2012  . Cardiac arrest ([place]) 01/04/2012  . Acute renal failure ([place]) 01/04/2012     Labs. 08/09/2014.  WBC 5.4 hemoglobin 9.2 platelets 208.  Sodium 145 potassium 3.4 BUN 28 creatinine 2.42.  Other phosphatase 131 albumin 2.5 otherwise liver function tests within normal limits.  07/24/2014.  WBC 8.2 hemoglobin 11.7 platelets 161.  Sodium 145 potassium 3.8 BUN 28 creatinine 2.2.  Hemoglobin A1c-9.5.   CBC    Component Value Date/Time   WBC 4.9 04/30/2014   WBC 5.1 01/21/2012 0720   RBC 3.72* 01/21/2012 0720   HGB 9.3* 04/30/2014   HCT 30* 04/30/2014   PLT 285 04/30/2014   MCV 81.5 01/21/2012 0720    CMP     Component Value Date/Time   NA 142 04/06/2014   NA 139 01/21/2012 0720   K 3.8 04/06/2014   CL 108 01/21/2012 0720   CO2 18* 01/21/2012 0720   GLUCOSE 185* 01/21/2012 0720   BUN 19 04/06/2014   BUN 53* 01/21/2012 0720   CREATININE 1.9* 04/06/2014   CREATININE 4.16* 01/21/2012 0720   CALCIUM 8.5 01/21/2012 0720   PROT 7.7 01/15/2012 0440   ALBUMIN 2.1* 01/21/2012 0720   AST 15 04/06/2014   ALT 10 04/06/2014   ALKPHOS 79 04/06/2014   BILITOT 0.4 01/15/2012 0440   GFRNONAA 13* 01/21/2012 0720   GFRAA 15* 01/21/2012 0720    Assessment and Plan   [id number] of metabolic acidosis  this has been stable will update a basic metabolic panel he continues on sodium bicarbonate.  #2 history seizure disorder this has been stable on some time on Keppra.  #3 history of chronic kidney disease --recent  creatinines have been largely in the mid twos we will update this  #4 history of pneumonia this was treated in the hospital there has been  no recurrence although I suspect he remains at risk.  #5 history of diabetes again variable blood sugars as noted above will update a hemoglobin A1c currently on Lantus as well as Humalog as needed.  #6 history of hypertension-this appears stable he is on Coreg 12.5 mg twice a  day as well as Norvasc 2.5 mg a day recent blood pressures 126/74-139/79-128/73-I also see 166/81 but this appears to be quite rare  #7 history of anemia most likely chronic disease he is on iron twice a day Will update a hemoglobin last hemoglobin noted in chart 9.2 previous one was 11.7 would like to see where we stand here.  ,  #8 history of pulmonary embolism he is on chronic Coumadin most recent INR 2.1 Dr. Sheppard Coil has ordered an update I do not see any evidence of increased bruising or bleeding.  History of GERD this appears stable he is on Protonix.  #10 history of bladder cancer as noted above not thought to be an aggressive candidate for chemotherapy he has received palliative radiation in the past-despite this he appears clinically to be stable he does not appear to be in discomfort or pain --I she appears to be somewhat more alert and interactive  #11-history of NPH mentally he appears to be at baseline today actually somewhat improved---most recent MRI apparently showed stability.   [id number]

## 2015-02-12 ENCOUNTER — Non-Acute Institutional Stay (SKILLED_NURSING_FACILITY): Payer: Medicare Other | Admitting: Internal Medicine

## 2015-02-12 ENCOUNTER — Encounter: Payer: Self-pay | Admitting: Internal Medicine

## 2015-02-12 DIAGNOSIS — G40909 Epilepsy, unspecified, not intractable, without status epilepticus: Secondary | ICD-10-CM

## 2015-02-12 DIAGNOSIS — R404 Transient alteration of awareness: Secondary | ICD-10-CM | POA: Diagnosis not present

## 2015-02-12 DIAGNOSIS — N189 Chronic kidney disease, unspecified: Secondary | ICD-10-CM | POA: Diagnosis not present

## 2015-02-12 DIAGNOSIS — D509 Iron deficiency anemia, unspecified: Secondary | ICD-10-CM

## 2015-02-12 NOTE — Progress Notes (Signed)
Patient ID: Noah Torres, male   DOB: 1941/01/30, 74 y.o.   MRN: [id number]   MRN: [id number] Name: Noah Torres  Sex: male Age: 74 y.o. DOB: 1940-06-21  [place] [facility]: [facility] Facility/Room:422 Level Of Care: SNF Provider: Wille Celeste Emergency Contacts: Extended Emergency Contact Information Primary Emergency Contact: [place],  [place] Home Phone: [phone] Relation: None Secondary Emergency Contact: Jones,[name] Address: [address]          [place], [place] [address] [name] of [place] Phone: [phone] Relation: Other  Code Status: FULL  Allergies: Metformin and related  Chief Complaint  Patient presents with  . Acute Visit   secondary to transitory change in level of consciousness-question absence seizure   HPI: Patient is 74 y.o. male who iwas admitted to SNF after being hospitalized for PNA, CKD and hypernatremia/dehydration. Patient has had a complicated medical history but nonetheless appears to have stabilized.  He does have a history of suspected seizures is on Keppra twice a day. Her doses been modified secondary to family concerns that patient had altered mental status on higher doses of Keppra during the day-she is currently on 250 mg every morning 750 mg daily at bedtime.  Apparently this afternoon patient had a change in mental status whereparently he was lying back with his eyes open but not really responding-apparently this was a very short duration and when I arrived in the room he was back to his baseline alert talking making eye contact moving extremities at baseline Apparently according in nursing staff this has happened before several times although not frequently apparently-- once every month or 2-one would suspect this was possibly a very short duration absence seizure although again by the time I got in the room he was at his baseline.  Vital signs remained stable  He is a poor historian but does not complaining of  any headache dizziness visual changes shortness of breath or chest pain .       Past Medical History  Diagnosis Date  . Diabetes mellitus, type 2 ([place])   . Hypercholesterolemia   . Former smoker   . Bilateral hearing loss     Wears hearing aids  . Chronic kidney disease   . Hearing loss     uses hearing aids  . Seizures ([place]) 09/13/2013    Past Surgical History  Procedure Laterality Date  . Spine surgery        Medication List       This list is accurate as of: 02/12/15  5:26 PM.  Always use your most recent med list.               amLODipine 2.5 MG tablet  Commonly known as:  NORVASC  Take 2.5 mg by mouth daily.     carvedilol 12.5 MG tablet  Commonly known as:  COREG  Place 1 tablet (12.5 mg total) into feeding tube 2 (two) times daily with a meal.     escitalopram 10 MG tablet  Commonly known as:  LEXAPRO  Take 10 mg by mouth daily. For depression     ferrous sulfate 325 (65 FE) MG tablet  Take 325 mg by mouth 2 (two) times daily with a meal.     FLORASTOR 250 MG capsule  Generic drug:  saccharomyces boulardii  Take 250 mg by mouth 2 (two) times daily.     insulin aspart 100 UNIT/ML injection  Commonly known as:  novoLOG  Inject 5 Units into the skin 3 (three) times daily before meals. Give 5 units prior to breakfast and lunch for CBG greater than 175 Give 5 units prior to dinner for CBG greater than 250     insulin glargine 100 UNIT/ML injection  Commonly known as:  LANTUS  Inject 22 Units into the skin at bedtime.     insulin regular 100 units/mL injection  Commonly known as:  NOVOLIN R,HUMULIN R  Inject 12 Units into the skin as needed for high blood sugar. Give 12 units SQ as needed for blood sugar over 401.     levETIRAcetam 500 MG tablet  Commonly known as:  KEPPRA  Take 500 mg by mouth 2 (two) times daily. Takeone half tab to equal 250 mg every morning   Take 1-1/2 tablets to equal 750 mg daily at bedtime     pantoprazole sodium 40 mg/20  mL Pack  Commonly known as:  PROTONIX  Place 20 mLs (40 mg total) into feeding tube daily.     sodium bicarbonate 650 MG tablet  Take 650 mg by mouth 2 (two) times daily.     warfarin 7.5 MG tablet  Commonly known as:  COUMADIN  Take 5 mg by mouth daily at 6 PM.          Immunization History  Administered Date(s) Administered  . Influenza-Unspecified 11/29/2012, 12/15/2013  . PPD Test 01/21/2012    Social History  Substance Use Topics  . Smoking status: Former Research scientist (life sciences)  . Smokeless tobacco: Not on file  . Alcohol Use: No    Family history is noncontributory    Review of Systems  DATA OBTAINED: from patient, nurse GENERAL:  no fevers, fatigue, appetite changes SKIN: No itching, rash EYES: No eye pain, redness, discharge EARS: No earache, tinnitus, change in hearing NOSE: No congestion, drainage or bleeding  MOUTH/THROAT: No mouth or tooth pain, No sore throat RESPIRATORY: No cough, wheezing, SOB CARDIAC: No chest pain, palpitations, lower extremity edema  GI: No abdominal pain, No N/V/D or constipation, No heartburn or reflux  GU: No dysuria, frequency or urgency, or incontinence  MUSCULOSKELETAL: No unrelieved bone/joint pain NEUROLOGIC: No headache, dizziness or focal weakness--again did have a short period of altered mental status earlier this afternoon PSYCHIATRIC: No overt anxiety or sadness, No behavior issue.   Filed Vitals:   02/12/15 1725  BP: 155/89  Pulse: 76  Temp: 98.3 F (36.8 C)  Resp: 20    Physical Exam  GENERAL APPEARANCE: Alert, somewhat conversant,  No acute distress. Appears at his baseline lying in bed comfortably  SKIN: No diaphoresis rash HEAD: Normocephalic, atraumatic  EYES: Conjunctiva/lids clear. Pupils round, reactive. EOMs intact.  EARS: External exam WNL, canals clear. Hearing grossly normal.  NOSE: No deformity or discharge.  MOUTH/THROAT: Oropharynx clear mucous membranes moist RESPIRATORY: Breathing is even, unlabored.  Lung sounds are clear   CARDIOVASCULAR: Heart RRR no murmurs, rubs or gallops. minimal peripheral edema.   GASTROINTESTINAL: Abdomen is soft, non-tender, not distended w/ normal bowel sounds.  MUSCULOSKELETAL: No abnormal joints or musculature general frailty and weakness but is able to move all extremities 4 with mild right sided weakness which is baseline--sometimes has difficulty following verbal commands completely but is able to move his lower extremities NEUROLOGIC:  Cranial nerves 2-12 grossly intact--history of mild right-sided weakness but this is stable--he is speaking some about at baseline PSYCHIATRIC: pt , at his baseline, no behavioral issues--a bit more talkative   Patient Active Problem List  Diagnosis Date Noted  . CKD (chronic kidney disease) stage 4, GFR 15-29 ml/min (HCC) 08/10/2014  . Metabolic acidosis [id number]  . Hypernatremia 08/10/2014  . Hyperglycemia 08/03/2014  . HCAP (healthcare-associated pneumonia) 08/03/2014  . Occasional tremors 08/02/2014  . Type 2 diabetes with nephropathy ([place]) 01/02/2014  . Mass of bladder 12/11/2013  . Pain in joint, lower leg 11/12/2013  . DVT (deep venous thrombosis) ([place]) 11/09/2013  . Seizures ([place]) 11/09/2013  . Altered mental status 10/16/2013  . Weakness 10/16/2013  . New onset seizure ([place]) 09/13/2013  . Dizziness 07/19/2013  . Fever presenting with conditions classified elsewhere 04/05/2013  . Cough 04/05/2013  . Type II or unspecified type diabetes mellitus with renal manifestations, uncontrolled 04/01/2013  . Thrush 03/01/2013  . Anemia, iron deficiency 09/22/2012  . Benign renovascular hypertension 07/26/2012  . Diabetes mellitus type 2 with complications ([place]) 0000000  . GERD (gastroesophageal reflux disease) 07/04/2012  . Chronic anticoagulation 07/04/2012  . Anxiety state, unspecified 07/04/2012  . Essential hypertension, benign 07/04/2012  . Type II or unspecified type diabetes mellitus without mention  of complication, not stated as uncontrolled 07/04/2012  . Esophageal reflux 07/04/2012  . Other pulmonary embolism and infarction 07/04/2012  . Long term current use of anticoagulant therapy 05/09/2012  . HTN (hypertension) 01/21/2012  . DM type 2 (diabetes mellitus, type 2) ([place]) 01/21/2012  . Urinary retention 01/21/2012  . Encephalopathy 01/09/2012  . Normal pressure hydrocephalus 01/09/2012  . Acute respiratory failure ([place]) 01/04/2012  . PE (pulmonary thromboembolism) ([place]) 01/04/2012  . Cardiac arrest ([place]) 01/04/2012  . Acute renal failure ([place]) 01/04/2012     Labs.  02/08/2015.  Sodium 138 potassium 4.3 BUN 29 creatinine 2.1-liver function tests within normal limits except alkaline phosphatase minimally elevated at 123.  WBC 4.5 hemoglobin 12.5 platelets 192 08/09/2014.  WBC 5.4 hemoglobin 9.2 platelets 208.  Sodium 145 potassium 3.4 BUN 28 creatinine 2.42.  Other phosphatase 131 albumin 2.5 otherwise liver function tests within normal limits.  07/24/2014.  WBC 8.2 hemoglobin 11.7 platelets 161.  Sodium 145 potassium 3.8 BUN 28 creatinine 2.2.  Hemoglobin A1c-9.5.   CBC    Component Value Date/Time   WBC 4.9 04/30/2014   WBC 5.1 01/21/2012 0720   RBC 3.72* 01/21/2012 0720   HGB 9.3* 04/30/2014   HCT 30* 04/30/2014   PLT 285 04/30/2014   MCV 81.5 01/21/2012 0720    CMP     Component Value Date/Time   NA 142 04/06/2014   NA 139 01/21/2012 0720   K 3.8 04/06/2014   CL 108 01/21/2012 0720   CO2 18* 01/21/2012 0720   GLUCOSE 185* 01/21/2012 0720   BUN 19 04/06/2014   BUN 53* 01/21/2012 0720   CREATININE 1.9* 04/06/2014   CREATININE 4.16* 01/21/2012 0720   CALCIUM 8.5 01/21/2012 0720   PROT 7.7 01/15/2012 0440   ALBUMIN 2.1* 01/21/2012 0720   AST 15 04/06/2014   ALT 10 04/06/2014   ALKPHOS 79 04/06/2014   BILITOT 0.4 01/15/2012 0440   GFRNONAA 13* 01/21/2012 0720   GFRAA 15* 01/21/2012 0720    Assessment and Plan  #1-short duration  altered mental status this afternoon-one would be suspicious of an absence seizure with his history-he is on Keppra twice a day again we have dose this somewhat conservatively earlier in the day secondary to concerns of altered mental status oversedation concerns by the family previously-at this point will monitor--recent labs have been unremarkablefor change from baseline--will update a TSH tomorrow  however.  Monitor with  vital signs neuro checks every 4 hours 3 and then every shift for 2 additional days Since patient receives a significantly higher dose of Keppra at bedtime would be hesitant to increase dose later in the day-will await Dr. Redgie Grayer input on possibly increasing the earlier in the day dose although I believe his family has had concerns with this previously       #2 history of chronic kidney disease --recent  creatinines have been largely in the mid twos most recently 2.1 on lab done December 23 which actually is at the lower end of his baseline at this point continue to monitor    #3 history of anemia most likely chronic disease he is on iron twice a day  Hemoglobin of 12.5 on lab December 23 actually shows improvement.     #4-history of NPH mentally he appears to be at baseline today actually somewhat improved---most recent MRI apparently showed stability.   [id number]

## 2015-03-21 ENCOUNTER — Non-Acute Institutional Stay (SKILLED_NURSING_FACILITY): Payer: Medicare Other | Admitting: Internal Medicine

## 2015-03-21 ENCOUNTER — Encounter: Payer: Self-pay | Admitting: Internal Medicine

## 2015-03-21 DIAGNOSIS — R569 Unspecified convulsions: Secondary | ICD-10-CM

## 2015-03-21 DIAGNOSIS — N184 Chronic kidney disease, stage 4 (severe): Secondary | ICD-10-CM | POA: Diagnosis not present

## 2015-03-21 DIAGNOSIS — E118 Type 2 diabetes mellitus with unspecified complications: Secondary | ICD-10-CM

## 2015-03-21 DIAGNOSIS — Z86711 Personal history of pulmonary embolism: Secondary | ICD-10-CM

## 2015-03-21 LAB — HEPATIC FUNCTION PANEL
ALT: 17 U/L (ref 10–40)
AST: 20 U/L (ref 14–40)
Alkaline Phosphatase: 101 U/L (ref 25–125)
BILIRUBIN, TOTAL: 0.3 mg/dL

## 2015-03-21 LAB — BASIC METABOLIC PANEL
BUN: 26 mg/dL — AB (ref 4–21)
Creatinine: 2.2 mg/dL — AB (ref <=1.3)
Potassium: 4.5 mmol/L (ref 3.4–5.3)
Sodium: 139 mmol/L (ref 137–147)

## 2015-03-21 LAB — CBC AND DIFFERENTIAL
HEMATOCRIT: 38 % — AB (ref 41–53)
HEMOGLOBIN: 12.1 g/dL — AB (ref 13.5–17.5)
Platelets: 188 10*3/uL (ref 150–399)
WBC: 4.4 10*3/mL

## 2015-03-21 LAB — TSH: TSH: 1.14 u[IU]/mL (ref <=5.90)

## 2015-03-21 NOTE — Progress Notes (Signed)
Patient ID: Noah Torres, male   DOB: 1940/11/28, 75 y.o.   MRN: [id number]   MRN: [id number] Name: Noah Torres  Sex: male Age: 75 y.o. DOB: 1940/09/30  [place] #: [facility] Facility/Room:422 Level Of Care: SNF Provider: Wille Celeste Emergency Contacts: Extended Emergency Contact Information Primary Emergency Contact: [place],  [place] Home Phone: [phone] Relation: None Secondary Emergency Contact: Jones,[name] Address: [address]          [place], [place] [address] Noah Torres of [place] Phone: [phone] Relation: Other  Code Status: FULL  Allergies: Metformin and related  Chief Complaint  Patient presents with  . Acute Visit  . Medical Management of Chronic Issues   including chronic kidney disease-history of NPH- -diabetes type 2-bladder carcinoma-hypertension-depression--- acute visit secondary to possible seizure  HPI: Patient is 75 y.o. male who iwas admitted to SNF after being hospitalized for PNA, CKD and hypernatremia/dehydration. Patient has had a complicated medical history but nonetheless appears to have stabilized.  He does have a history of suspected seizures is on Keppra twice a day.--Earlier today apparently had a short episode his extremities were shakin--and he had decreased responsive-ghe was taken to the room and actually by the time I got there he was back at his baseline he was alert responsive being transferred to bed and he was resting comfortably in bed.  Neurologically he appeared to be at baseline he was bright alert--vitall signs were stable he CBG was 145.  He has no complaints of headache dizziness chest pain or dizziness or shortness of breath  Also a type II diabetic on Lantus 22 units daily as well as Humalog sliding scale insulin-blood sugars appear to be variable  Per nursing has frequent readings in the 100s with  spikes into the 200s hemoglobin A1c was 7.8 back in December-we have been somewhat  conservative here secondary to his numerous comorbidities and variability  Patient also has a history of a bladder cancer this is considered inoperable he has had palliative radiation in the past-not thought to be a candidate for chemotherapy-nonetheless he appears to be stable in this regards  Patient does have a history of NPH-as well as a pulmonary embolism he is on chronic Coumadin.-INR is being monitored and update INR has been ordered    he is a poor historianand however nursing staff does not report any acute issues--however again he appears to have possibly a short seizure episode earlier today-per nursing he has these occasionally every month or 2.  He is on Keppra 750 mg daily at bedtime in 250 mg every morning-family has wanted somewhat conservative dosing secondary concerns he has altered mental status  when he gets a higher dose  -    Past Medical History  Diagnosis Date  . Diabetes mellitus, type 2 ([place])   . Hypercholesterolemia   . Former smoker   . Bilateral hearing loss     Wears hearing aids  . Chronic kidney disease   . Hearing loss     uses hearing aids  . Seizures ([place]) 09/13/2013    Past Surgical History  Procedure Laterality Date  . Spine surgery        Medication List       This list is accurate as of: 03/21/15 11:59 PM.  Always use your most recent med list.               amLODipine 2.5 MG tablet  Commonly known as:  NORVASC  Take 2.5 mg by mouth daily.     carvedilol 12.5 MG tablet  Commonly known as:  COREG  Place 1 tablet (12.5 mg total) into feeding tube 2 (two) times daily with a meal.     escitalopram 10 MG tablet  Commonly known as:  LEXAPRO  Take 10 mg by mouth daily. For depression     ferrous sulfate 325 (65 FE) MG tablet  Take 325 mg by mouth 2 (two) times daily with a meal.     FLORASTOR 250 MG capsule  Generic drug:  saccharomyces boulardii  Take 250 mg by mouth 2 (two) times daily.     insulin aspart 100 UNIT/ML  injection  Commonly known as:  novoLOG  Inject 5 Units into the skin 3 (three) times daily before meals. Give 5 units prior to breakfast and lunch for CBG greater than 175 Give 5 units prior to dinner for CBG greater than 250     insulin glargine 100 UNIT/ML injection  Commonly known as:  LANTUS  Inject 22 Units into the skin at bedtime.     insulin regular 100 units/mL injection  Commonly known as:  NOVOLIN R,HUMULIN R  Inject 12 Units into the skin as needed for high blood sugar. Give 12 units SQ as needed for blood sugar over 401.     levETIRAcetam 500 MG tablet  Commonly known as:  KEPPRA  Take 500 mg by mouth 2 (two) times daily. Takeone half tab to equal 250 mg every morning   Take 1-1/2 tablets to equal 750 mg daily at bedtime     pantoprazole sodium 40 mg/20 mL Pack  Commonly known as:  PROTONIX  Place 20 mLs (40 mg total) into feeding tube daily.     sodium bicarbonate 650 MG tablet  Take 650 mg by mouth 2 (two) times daily.     warfarin 7.5 MG tablet  Commonly known as:  COUMADIN  Take 5 mg by mouth daily at 6 PM.        No orders of the defined types were placed in this encounter.    Immunization History  Administered Date(s) Administered  . Influenza-Unspecified 11/29/2012, 12/15/2013  . PPD Test 01/21/2012    Social History  Substance Use Topics  . Smoking status: Former Research scientist (life sciences)  . Smokeless tobacco: Not on file  . Alcohol Use: No    Family history is noncontributory    Review of Systems  DATA OBTAINED: from patient, nurse GENERAL:  no fevers, fatigue, appetite changes SKIN: No itching, rash EYES: No eye pain, redness, discharge EARS: No earache, tinnitus, change in hearing NOSE: No congestion, drainage or bleeding  MOUTH/THROAT: No mouth or tooth pain, No sore throat RESPIRATORY: No cough, wheezing, SOB CARDIAC: No chest pain, palpitations, lower extremity edema  GI: No abdominal pain, No N/V/D or constipation, No heartburn or reflux  GU: No  dysuria, frequency or urgency, or incontinence  MUSCULOSKELETAL: No unrelieved bone/joint pain NEUROLOGIC: No headache, dizziness or focal weakness--possible short seizure episode earlier today PSYCHIATRIC: No overt anxiety or sadness, No behavior issue.   Filed Vitals:   03/21/15 2212  BP: 118/73  Pulse: 99  Temp: 96.5 F (35.8 C)  Resp: 26    Physical Exam  GENERAL APPEARANCE: Alert, somewhat conversant,  No acute distress. Lying comfortably in bed SKIN: No diaphoresis rash HEAD: Normocephalic, atraumatic  EYES: Conjunctiva/lids clear. Pupils round, reactive. EOMs intact.  EARS: External exam WNL, canals clear. Hearing grossly normal.  NOSE: No  deformity or discharge.  MOUTH/THROAT: Oropharynx clear mucous membranes moist RESPIRATORY: Breathing is even, unlabored. Lung sounds are clear   CARDIOVASCULAR: Heart RRR no murmurs, rubs or gallops. minimal peripheral edema.   GASTROINTESTINAL: Abdomen is soft, non-tender, not distended w/ normal bowel sounds.  MUSCULOSKELETAL: No abnormal joints or musculature general frailty and weakness but is able to move all extremities 4 with mild right sided weakness which is baseline NEUROLOGIC:  Cranial nerves 2-12 grossly intact--history of mild right-sided weakness but this is stable PSYCHIATRIC: pt , at his baseline, no behavioral issues--he does speak but not much which is often the case   Patient Active Problem List   Diagnosis Date Noted  . CKD (chronic kidney disease) stage 4, GFR 15-29 ml/min (HCC) 08/10/2014  . Metabolic acidosis [id number]  . Hypernatremia 08/10/2014  . Hyperglycemia 08/03/2014  . HCAP (healthcare-associated pneumonia) 08/03/2014  . Occasional tremors 08/02/2014  . Type 2 diabetes with nephropathy ([place]) 01/02/2014  . Mass of bladder 12/11/2013  . Pain in joint, lower leg 11/12/2013  . DVT (deep venous thrombosis) ([place]) 11/09/2013  . Seizures ([place]) 11/09/2013  . Altered mental status 10/16/2013  . Weakness  10/16/2013  . New onset seizure ([place]) 09/13/2013  . Dizziness 07/19/2013  . Fever presenting with conditions classified elsewhere 04/05/2013  . Cough 04/05/2013  . Type II or unspecified type diabetes mellitus with renal manifestations, uncontrolled 04/01/2013  . Thrush 03/01/2013  . Anemia, iron deficiency 09/22/2012  . Benign renovascular hypertension 07/26/2012  . Diabetes mellitus type 2 with complications ([place]) 0000000  . GERD (gastroesophageal reflux disease) 07/04/2012  . Chronic anticoagulation 07/04/2012  . Anxiety state, unspecified 07/04/2012  . Essential hypertension, benign 07/04/2012  . Type II or unspecified type diabetes mellitus without mention of complication, not stated as uncontrolled 07/04/2012  . Esophageal reflux 07/04/2012  . History of pulmonary embolism 07/04/2012  . Long term current use of anticoagulant therapy 05/09/2012  . HTN (hypertension) 01/21/2012  . DM type 2 (diabetes mellitus, type 2) ([place]) 01/21/2012  . Urinary retention 01/21/2012  . Encephalopathy 01/09/2012  . Normal pressure hydrocephalus 01/09/2012  . Acute respiratory failure ([place]) 01/04/2012  . PE (pulmonary thromboembolism) ([place]) 01/04/2012  . Cardiac arrest ([place]) 01/04/2012  . Acute renal failure ([place]) 01/04/2012     Labs.  02/08/2015.  Sodium 138 potassium 4.3 BUN 29 creatinine 2.1-.  Alkaline phosphatase 123 otherwise liver function tests within normal limits.  Hemoglobin A1c 7.8.  WBC 4.5 hemoglobin 12.5 platelets 192.  02/13/2015.  TSH-0.80 08/09/2014.  WBC 5.4 hemoglobin 9.2 platelets 208.  Sodium 145 potassium 3.4 BUN 28 creatinine 2.42.  Other phosphatase 131 albumin 2.5 otherwise liver function tests within normal limits.  07/24/2014.  WBC 8.2 hemoglobin 11.7 platelets 161.  Sodium 145 potassium 3.8 BUN 28 creatinine 2.2.  Hemoglobin A1c-9.5.   CBC    Component Value Date/Time   WBC 4.9 04/30/2014   WBC 5.1 01/21/2012 0720   RBC 3.72*  01/21/2012 0720   HGB 9.3* 04/30/2014   HCT 30* 04/30/2014   PLT 285 04/30/2014   MCV 81.5 01/21/2012 0720    CMP     Component Value Date/Time   NA 142 04/06/2014   NA 139 01/21/2012 0720   K 3.8 04/06/2014   CL 108 01/21/2012 0720   CO2 18* 01/21/2012 0720   GLUCOSE 185* 01/21/2012 0720   BUN 19 04/06/2014   BUN 53* 01/21/2012 0720   CREATININE 1.9* 04/06/2014   CREATININE 4.16* 01/21/2012 0720   CALCIUM 8.5 01/21/2012  0720   PROT 7.7 01/15/2012 0440   ALBUMIN 2.1* 01/21/2012 0720   AST 15 04/06/2014   ALT 10 04/06/2014   ALKPHOS 79 04/06/2014   BILITOT 0.4 01/15/2012 0440   GFRNONAA 13* 01/21/2012 0720   GFRAA 15* 01/21/2012 0720    Assessment and Plan   [id number] of metabolic acidosis  this has been stable will update a basic metabolic panel he continues on sodium bicarbonate.  #2 history seizure disorder as noted above he is on Keppra twice a day with dosing somewhat conservative secondary to family concerns of altered mental status-will update labs including a CBC CMP TSH stat as well as a Keppra level-I did do updated exam later in the day patient remains at his baseline lying in bed comfortably he is bright alert neurologically at baseline I do not see any changes  #3 history of chronic kidney disease --recent  creatinines have been largely in the mid twos we will update this  #4 history of pneumonia this was treated in the hospital there has been no recurrence although I suspect he remains at risk.  #5 history of diabetes again variable blood sugars as noted above --hemoglobin A1c 7.8- currently on Lantus as well as Humalog as needed.--Somewhat conservative secondary to variability of his blood sugars and numerous comorbidities  #6 history of hypertension-this appears stable he is on Coreg 12.5 mg twice a day as well as Norvasc 2.5 mg a day Blood pressure today 118/73  #7 history of anemia most likely chronic disease he is on iron twice a day Will update a  hemoglobin last hemoglobin was 12.5 on 02/08/2015-this actually appears to be improved from previous readings   ,  #8 history of pulmonary embolism he is on chronic Coumadin--  Dr. Sheppard Coil has ordered an update  INR I do not see any evidence of increased bruising or bleeding.  History of GERD this appears stable he is on Protonix.  #10 history of bladder cancer as noted above not thought to be an aggressive candidate for chemotherapy he has received palliative radiation in the past-despite this he appears clinically to be stable he does not appear to be in discomfort or pain   #11-history of NPH mentally he appears to be at baseline toda --most recent MRI apparently showed stability.  Addendum we have obtained some updated labs.  They appear to be fairly unremarkable.  Creatinine is 2.2 BUN 26 sodium 139 potassium 4.5 CO2 level was 23.  TSH is within normal limits at 1.14.  Hemoglobin show stability at 12.1 white count is within normal range at 4.4 platelets are 188.  [id number] note greater than 35 minutes spent assessing patient-reviewing his chart-re-assessing patient-discussing his status with nursing staff-and coordinating and formulating a plan of care for numerous diagnoses-of note greater than 50% of time spent coordinating plan of care for numerous diagnoses   [phone]

## 2015-03-29 ENCOUNTER — Non-Acute Institutional Stay (SKILLED_NURSING_FACILITY): Payer: Medicare Other | Admitting: Internal Medicine

## 2015-03-29 DIAGNOSIS — R569 Unspecified convulsions: Secondary | ICD-10-CM | POA: Diagnosis not present

## 2015-03-29 DIAGNOSIS — E1121 Type 2 diabetes mellitus with diabetic nephropathy: Secondary | ICD-10-CM | POA: Diagnosis not present

## 2015-03-29 DIAGNOSIS — K219 Gastro-esophageal reflux disease without esophagitis: Secondary | ICD-10-CM

## 2015-04-06 ENCOUNTER — Encounter: Payer: Self-pay | Admitting: Internal Medicine

## 2015-04-06 NOTE — Assessment & Plan Note (Addendum)
In 01/2015 A1c was 7.8; cont insulin, may could increase meal time insulin

## 2015-04-06 NOTE — Assessment & Plan Note (Signed)
It ws reported pt had a seizure this week; no details available; Pt continues on keppra 500 mg BID; level was 21.5

## 2015-04-06 NOTE — Progress Notes (Signed)
MRN: [id number] Name: Noah Torres  Sex: male Age: 75 y.o. DOB: 1940/07/26  [place] #: [facility] Facility/Room: Level Of Care: SNF Provider: Inocencio Homes D Emergency Contacts: Extended Emergency Contact Information Primary Emergency Contact: [place],  [place] Home Phone: [phone] Relation: None Secondary Emergency Contact: Jones,[name] Address: [address]          [place], [place] [address] [name] of [place] Phone: [phone] Relation: Other  Code Status:   Allergies: Metformin and related  Chief Complaint  Patient presents with  . Medical Management of Chronic Issues    HPI: Patient is 75 y.o. male who is being seen for routine issues of seizures, DM2 and GERD.  Past Medical History  Diagnosis Date  . Diabetes mellitus, type 2 ([place])   . Hypercholesterolemia   . Former smoker   . Bilateral hearing loss     Wears hearing aids  . Chronic kidney disease   . Hearing loss     uses hearing aids  . Seizures ([place]) 09/13/2013    Past Surgical History  Procedure Laterality Date  . Spine surgery        Medication List       This list is accurate as of: 03/29/15 11:59 PM.  Always use your most recent med list.               amLODipine 2.5 MG tablet  Commonly known as:  NORVASC  Take 2.5 mg by mouth daily.     carvedilol 12.5 MG tablet  Commonly known as:  COREG  Place 1 tablet (12.5 mg total) into feeding tube 2 (two) times daily with a meal.     escitalopram 10 MG tablet  Commonly known as:  LEXAPRO  Take 10 mg by mouth daily. For depression     ferrous sulfate 325 (65 FE) MG tablet  Take 325 mg by mouth 2 (two) times daily with a meal.     FLORASTOR 250 MG capsule  Generic drug:  saccharomyces boulardii  Take 250 mg by mouth 2 (two) times daily.     insulin aspart 100 UNIT/ML injection  Commonly known as:  novoLOG  Inject 5 Units into the skin 3 (three) times daily before meals. Give 5 units prior to breakfast and  lunch for CBG greater than 175 Give 5 units prior to dinner for CBG greater than 250     insulin glargine 100 UNIT/ML injection  Commonly known as:  LANTUS  Inject 22 Units into the skin at bedtime.     insulin regular 100 units/mL injection  Commonly known as:  NOVOLIN R,HUMULIN R  Inject 12 Units into the skin as needed for high blood sugar. Give 12 units SQ as needed for blood sugar over 401.     levETIRAcetam 500 MG tablet  Commonly known as:  KEPPRA  Take 500 mg by mouth 2 (two) times daily. Takeone half tab to equal 250 mg every morning   Take 1-1/2 tablets to equal 750 mg daily at bedtime     pantoprazole sodium 40 mg/20 mL Pack  Commonly known as:  PROTONIX  Place 20 mLs (40 mg total) into feeding tube daily.     sodium bicarbonate 650 MG tablet  Take 650 mg by mouth 2 (two) times daily.     warfarin 7.5 MG tablet  Commonly known as:  COUMADIN  Take 5 mg by mouth daily at 6 PM.  No orders of the defined types were placed in this encounter.    Immunization History  Administered Date(s) Administered  . Influenza-Unspecified 11/29/2012, 12/15/2013  . PPD Test 01/21/2012    Social History  Substance Use Topics  . Smoking status: Former Research scientist (life sciences)  . Smokeless tobacco: Not on file  . Alcohol Use: No    Review of Systems  DATA OBTAINED: from patient, nurse,pt's roommate GENERAL:  no fevers, fatigue, appetite changes SKIN: No itching, rash HEENT: No complaint RESPIRATORY: No cough, wheezing, SOB CARDIAC: No chest pain, palpitations, lower extremity edema  GI: No abdominal pain, No N/V/D or constipation, No heartburn or reflux  GU: No dysuria, frequency or urgency, or incontinence  MUSCULOSKELETAL: No unrelieved bone/joint pain NEUROLOGIC: No headache, dizziness  PSYCHIATRIC: No overt anxiety or sadness; goes to activities out of room  Filed Vitals:   03/29/15 1536  BP: 129/75  Pulse: 62  Temp: 97.3 F (36.3 C)  Resp: 18    Physical Exam  GENERAL  APPEARANCE: Alert, min conversant, No acute distress  SKIN: No diaphoresis rash HEENT: Unremarkable RESPIRATORY: Breathing is even, unlabored. Lung sounds are clear   CARDIOVASCULAR: Heart RRR no murmurs, rubs or gallops. No peripheral edema  GASTROINTESTINAL: Abdomen is soft, non-tender, not distended w/ normal bowel sounds.  GENITOURINARY: Bladder non tender, not distended  MUSCULOSKELETAL: No abnormal joints or musculature NEUROLOGIC: Cranial nerves 2-12 grossly intact. Moves all extremities PSYCHIATRIC: Mood and affect appropriate to situation with dementia, no behavioral issues  Patient Active Problem List   Diagnosis Date Noted  . CKD (chronic kidney disease) stage 4, GFR 15-29 ml/min (HCC) 08/10/2014  . Metabolic acidosis [id number]  . Hypernatremia 08/10/2014  . Hyperglycemia 08/03/2014  . HCAP (healthcare-associated pneumonia) 08/03/2014  . Occasional tremors 08/02/2014  . Type 2 diabetes with nephropathy ([place]) 01/02/2014  . Mass of bladder 12/11/2013  . Pain in joint, lower leg 11/12/2013  . DVT (deep venous thrombosis) ([place]) 11/09/2013  . Seizures ([place]) 11/09/2013  . Altered mental status 10/16/2013  . Weakness 10/16/2013  . New onset seizure ([place]) 09/13/2013  . Dizziness 07/19/2013  . Fever presenting with conditions classified elsewhere 04/05/2013  . Cough 04/05/2013  . Type II or unspecified type diabetes mellitus with renal manifestations, uncontrolled 04/01/2013  . Thrush 03/01/2013  . Anemia, iron deficiency 09/22/2012  . Benign renovascular hypertension 07/26/2012  . Diabetes mellitus type 2 with complications ([place]) 0000000  . GERD (gastroesophageal reflux disease) 07/04/2012  . Chronic anticoagulation 07/04/2012  . Anxiety state, unspecified 07/04/2012  . Essential hypertension, benign 07/04/2012  . Type II or unspecified type diabetes mellitus without mention of complication, not stated as uncontrolled 07/04/2012  . Esophageal reflux 07/04/2012  .  History of pulmonary embolism 07/04/2012  . Long term current use of anticoagulant therapy 05/09/2012  . HTN (hypertension) 01/21/2012  . DM type 2 (diabetes mellitus, type 2) ([place]) 01/21/2012  . Urinary retention 01/21/2012  . Encephalopathy 01/09/2012  . Normal pressure hydrocephalus 01/09/2012  . Acute respiratory failure ([place]) 01/04/2012  . PE (pulmonary thromboembolism) ([place]) 01/04/2012  . Cardiac arrest ([place]) 01/04/2012  . Acute renal failure ([place]) 01/04/2012    CBC    Component Value Date/Time   WBC 4.9 04/30/2014   WBC 5.1 01/21/2012 0720   RBC 3.72* 01/21/2012 0720   HGB 9.3* 04/30/2014   HCT 30* 04/30/2014   PLT 285 04/30/2014   MCV 81.5 01/21/2012 0720    CMP     Component Value Date/Time   NA 142 04/06/2014  NA 139 01/21/2012 0720   K 3.8 04/06/2014   CL 108 01/21/2012 0720   CO2 18* 01/21/2012 0720   GLUCOSE 185* 01/21/2012 0720   BUN 19 04/06/2014   BUN 53* 01/21/2012 0720   CREATININE 1.9* 04/06/2014   CREATININE 4.16* 01/21/2012 0720   CALCIUM 8.5 01/21/2012 0720   PROT 7.7 01/15/2012 0440   ALBUMIN 2.1* 01/21/2012 0720   AST 15 04/06/2014   ALT 10 04/06/2014   ALKPHOS 79 04/06/2014   BILITOT 0.4 01/15/2012 0440   GFRNONAA 13* 01/21/2012 0720   GFRAA 15* 01/21/2012 0720    Assessment and Plan  Seizures It ws reported pt had a seizure this week; no details available; Pt continues on keppra 500 mg BID; level was 21.5  Type 2 diabetes with nephropathy In 01/2015 A1c was 7.8; cont insulin, may could increase meal time insulin  GERD (gastroesophageal reflux disease) No signs or sx of reflux or aspiration; plan - cont protonix 40 mg daily    Hennie Duos, MD

## 2015-04-06 NOTE — Assessment & Plan Note (Signed)
No signs or sx of reflux or aspiration; plan - cont protonix 40 mg daily

## 2015-04-18 LAB — BASIC METABOLIC PANEL: GLUCOSE: 137 mg/dL

## 2015-05-14 ENCOUNTER — Encounter: Payer: Self-pay | Admitting: *Deleted

## 2015-05-31 ENCOUNTER — Encounter: Payer: Self-pay | Admitting: Internal Medicine

## 2015-05-31 ENCOUNTER — Non-Acute Institutional Stay (SKILLED_NURSING_FACILITY): Payer: Medicare Other | Admitting: Internal Medicine

## 2015-05-31 DIAGNOSIS — D509 Iron deficiency anemia, unspecified: Secondary | ICD-10-CM

## 2015-05-31 DIAGNOSIS — I825Y9 Chronic embolism and thrombosis of unspecified deep veins of unspecified proximal lower extremity: Secondary | ICD-10-CM | POA: Diagnosis not present

## 2015-05-31 DIAGNOSIS — N184 Chronic kidney disease, stage 4 (severe): Secondary | ICD-10-CM | POA: Diagnosis not present

## 2015-05-31 NOTE — Assessment & Plan Note (Signed)
Hb 12.1 on BID iron; MCV 82; lan - cont iron; will monitor at intervals

## 2015-05-31 NOTE — Assessment & Plan Note (Signed)
Cont on coumadin which is actively being titrated

## 2015-05-31 NOTE — Assessment & Plan Note (Signed)
GFR 36  BUN 26/ Cr  2.2 ; stable; will monitor at intervals

## 2015-05-31 NOTE — Progress Notes (Signed)
MRN: [id number] Name: Noah Torres  Sex: male Age: 75 y.o. DOB: 1940-07-30  [place] [facility]: [facility] Facility/Room: Level Of Care: SNF Provider: Inocencio Homes D Emergency Contacts: Extended Emergency Contact Information Primary Emergency Contact: [place],  [place] Home Phone: [phone] Relation: None Secondary Emergency Contact: Jones,[name] Address: [address]          [place], [place] [address] [name] of [place] Phone: [phone] Relation: Other  Code Status:   Allergies: Metformin and related  Chief Complaint  Patient presents with  . Medical Management of Chronic Issues    HPI: Patient is 75 y.o. male who is being seen for CKD 3, DVT and anemia..  Past Medical History  Diagnosis Date  . Diabetes mellitus, type 2 ([place])   . Hypercholesterolemia   . Former smoker   . Bilateral hearing loss     Wears hearing aids  . Chronic kidney disease   . Hearing loss     uses hearing aids  . Seizures ([place]) 09/13/2013    Past Surgical History  Procedure Laterality Date  . Spine surgery        Medication List       This list is accurate as of: 05/31/15 10:41 PM.  Always use your most recent med list.               amLODipine 2.5 MG tablet  Commonly known as:  NORVASC  Take 2.5 mg by mouth daily.     carvedilol 12.5 MG tablet  Commonly known as:  COREG  Place 1 tablet (12.5 mg total) into feeding tube 2 (two) times daily with a meal.     escitalopram 10 MG tablet  Commonly known as:  LEXAPRO  Take 10 mg by mouth daily. For depression     ferrous sulfate 325 (65 FE) MG tablet  Take 325 mg by mouth 2 (two) times daily with a meal.     FLORASTOR 250 MG capsule  Generic drug:  saccharomyces boulardii  Take 250 mg by mouth 2 (two) times daily.     insulin aspart 100 UNIT/ML injection  Commonly known as:  novoLOG  Inject 5 Units into the skin 3 (three) times daily before meals. Give 5 units prior to breakfast and lunch for CBG  greater than 175 Give 5 units prior to dinner for CBG greater than 250     insulin glargine 100 UNIT/ML injection  Commonly known as:  LANTUS  Inject 22 Units into the skin at bedtime.     insulin regular 100 units/mL injection  Commonly known as:  NOVOLIN R,HUMULIN R  Inject 12 Units into the skin as needed for high blood sugar. Give 12 units SQ as needed for blood sugar over 401.     levETIRAcetam 500 MG tablet  Commonly known as:  KEPPRA  Take 500 mg by mouth 2 (two) times daily. Takeone half tab to equal 250 mg every morning   Take 1-1/2 tablets to equal 750 mg daily at bedtime     pantoprazole sodium 40 mg/20 mL Pack  Commonly known as:  PROTONIX  Place 20 mLs (40 mg total) into feeding tube daily.     sodium bicarbonate 650 MG tablet  Take 650 mg by mouth 2 (two) times daily.     warfarin 7.5 MG tablet  Commonly known as:  COUMADIN  Take 5 mg by mouth daily at 6 PM.  No orders of the defined types were placed in this encounter.    Immunization History  Administered Date(s) Administered  . Influenza-Unspecified 11/29/2012, 12/15/2013  . PPD Test 01/21/2012    Social History  Substance Use Topics  . Smoking status: Former Research scientist (life sciences)  . Smokeless tobacco: Not on file  . Alcohol Use: No    Review of Systems   UTO from pt 2/2 dementia; nursing without concerns    Filed Vitals:   05/31/15 1320  BP: 129/75  Pulse: 71  Temp: 97.5 F (36.4 C)  Resp: 20    Physical Exam  GENERAL APPEARANCE: Alert, min conversant, No acute distress  SKIN: No diaphoresis rash HEENT: Unremarkable RESPIRATORY: Breathing is even, unlabored. Lung sounds are clear   CARDIOVASCULAR: Heart RRR no murmurs, rubs or gallops. No peripheral edema  GASTROINTESTINAL: Abdomen is soft, non-tender, not distended w/ normal bowel sounds.  GENITOURINARY: Bladder non tender, not distended  MUSCULOSKELETAL: No abnormal joints or musculature NEUROLOGIC: Cranial nerves 2-12 grossly intact.  Moves all extremities PSYCHIATRIC: dementia, no behavioral issues  Patient Active Problem List   Diagnosis Date Noted  . CKD (chronic kidney disease) stage 4, GFR 15-29 ml/min (HCC) 08/10/2014  . Metabolic acidosis [id number]  . Hypernatremia 08/10/2014  . Hyperglycemia 08/03/2014  . HCAP (healthcare-associated pneumonia) 08/03/2014  . Occasional tremors 08/02/2014  . Type 2 diabetes with nephropathy ([place]) 01/02/2014  . Mass of bladder 12/11/2013  . Pain in joint, lower leg 11/12/2013  . DVT (deep venous thrombosis) ([place]) 11/09/2013  . Seizures ([place]) 11/09/2013  . Altered mental status 10/16/2013  . Weakness 10/16/2013  . New onset seizure ([place]) 09/13/2013  . Dizziness 07/19/2013  . Fever presenting with conditions classified elsewhere 04/05/2013  . Cough 04/05/2013  . Type II or unspecified type diabetes mellitus with renal manifestations, uncontrolled 04/01/2013  . Thrush 03/01/2013  . Anemia, iron deficiency 09/22/2012  . Benign renovascular hypertension 07/26/2012  . Diabetes mellitus type 2 with complications ([place]) 0000000  . GERD (gastroesophageal reflux disease) 07/04/2012  . Chronic anticoagulation 07/04/2012  . Anxiety state, unspecified 07/04/2012  . Essential hypertension, benign 07/04/2012  . Type II or unspecified type diabetes mellitus without mention of complication, not stated as uncontrolled 07/04/2012  . Esophageal reflux 07/04/2012  . History of pulmonary embolism 07/04/2012  . Long term current use of anticoagulant therapy 05/09/2012  . HTN (hypertension) 01/21/2012  . DM type 2 (diabetes mellitus, type 2) ([place]) 01/21/2012  . Urinary retention 01/21/2012  . Encephalopathy 01/09/2012  . Normal pressure hydrocephalus 01/09/2012  . Acute respiratory failure ([place]) 01/04/2012  . PE (pulmonary thromboembolism) ([place]) 01/04/2012  . Cardiac arrest ([place]) 01/04/2012  . Acute renal failure ([place]) 01/04/2012    CBC    Component Value Date/Time   WBC 4.4  03/21/2015   WBC 5.1 01/21/2012 0720   RBC 3.72* 01/21/2012 0720   HGB 12.1* 03/21/2015   HCT 38* 03/21/2015   PLT 188 03/21/2015   MCV 81.5 01/21/2012 0720    CMP     Component Value Date/Time   NA 139 03/21/2015   NA 139 01/21/2012 0720   K 4.5 03/21/2015   CL 108 01/21/2012 0720   CO2 18* 01/21/2012 0720   GLUCOSE 185* 01/21/2012 0720   BUN 26* 03/21/2015   BUN 53* 01/21/2012 0720   CREATININE 2.2* 03/21/2015   CREATININE 4.16* 01/21/2012 0720   CALCIUM 8.5 01/21/2012 0720   PROT 7.7 01/15/2012 0440   ALBUMIN 2.1* 01/21/2012 0720   AST 20 03/21/2015  ALT 17 03/21/2015   ALKPHOS 101 03/21/2015   BILITOT 0.4 01/15/2012 0440   GFRNONAA 13* 01/21/2012 0720   GFRAA 15* 01/21/2012 0720    Assessment and Plan  CKD (chronic kidney disease) stage 4, GFR 15-29 ml/min GFR 36  BUN 26/ Cr  2.2 ; stable; will monitor at intervals  DVT (deep venous thrombosis) Cont on coumadin which is actively being titrated  Anemia, iron deficiency Hb 12.1 on BID iron; MCV 82; lan - cont iron; will monitor at intervals    Hennie Duos, MD

## 2015-06-21 ENCOUNTER — Non-Acute Institutional Stay (SKILLED_NURSING_FACILITY): Payer: Medicare Other | Admitting: Internal Medicine

## 2015-06-21 DIAGNOSIS — D509 Iron deficiency anemia, unspecified: Secondary | ICD-10-CM

## 2015-06-21 DIAGNOSIS — I10 Essential (primary) hypertension: Secondary | ICD-10-CM | POA: Diagnosis not present

## 2015-06-21 DIAGNOSIS — E1121 Type 2 diabetes mellitus with diabetic nephropathy: Secondary | ICD-10-CM | POA: Diagnosis not present

## 2015-06-21 DIAGNOSIS — G912 (Idiopathic) normal pressure hydrocephalus: Secondary | ICD-10-CM | POA: Diagnosis not present

## 2015-06-21 DIAGNOSIS — R569 Unspecified convulsions: Secondary | ICD-10-CM | POA: Diagnosis not present

## 2015-06-21 NOTE — Progress Notes (Deleted)
Patient ID: Noah Torres, male   DOB: [birth year], 75 y.o.   MRN: [id number]  Location:  [facility] of Service:  [phone]) Provider:  Granville Lewis  No primary care provider on file.  No care team member to display  Extended Emergency Contact Information Primary Emergency Contact: [place],  [place] Phone: [phone] Relation: None Secondary Emergency Contact: Jones,[name] Address: [address]          [place], [place] [address] [name] of [place] Phone: [phone] Relation: Other  Code Status:  *** Goals of care: Advanced Directive information Advanced Directives 06/21/2015  Does patient have an advance directive? Yes  Type of Advance Directive Living will  Does patient want to make changes to advanced directive? No - Patient declined  Copy of advanced directive(s) in chart? Yes  Pre-existing out of facility DNR order (yellow form or pink MOST form) -     Chief Complaint  Patient presents with  . Medical Management of Chronic Issues    HPI:  Pt is a 75 y.o. male seen today for an acute visit for    Past Medical History  Diagnosis Date  . Diabetes mellitus, type 2 ([place])   . Hypercholesterolemia   . Former smoker   . Bilateral hearing loss     Wears hearing aids  . Chronic kidney disease   . Hearing loss     uses hearing aids  . Seizures ([place]) 09/13/2013   Past Surgical History  Procedure Laterality Date  . Spine surgery      Allergies  Allergen Reactions  . Metformin And Related       Medication List       This list is accurate as of: 06/21/15  2:28 PM.  Always use your most recent med list.               amLODipine 2.5 MG tablet  Commonly known as:  NORVASC  Take 2.5 mg by mouth daily.     carvedilol 12.5 MG tablet  Commonly known as:  COREG  Place 1 tablet (12.5 mg total) into feeding tube 2 (two) times daily with a meal.     escitalopram 10 MG tablet  Commonly known as:   LEXAPRO  Take 10 mg by mouth daily. For depression     ferrous sulfate 325 (65 FE) MG tablet  Take 325 mg by mouth 2 (two) times daily with a meal.     HUMALOG KWIKPEN 100 UNIT/ML KiwkPen  Generic drug:  insulin lispro  Give sliding scale  Insulin before meals as follows 175-300=5 units, 301-400=8 units, 401-500=10 units, call MD if greater than 500     insulin glargine 100 UNIT/ML injection  Commonly known as:  LANTUS  Inject 22 Units into the skin at bedtime.     levETIRAcetam 250 MG tablet  Commonly known as:  KEPPRA  Take 250 mg by mouth daily.     levETIRAcetam 750 MG tablet  Commonly known as:  KEPPRA  Take 750 mg by mouth daily.     MULTIVITAMIN ADULT PO  Give 1 tablet by mouth daily     pantoprazole sodium 40 mg/20 mL Pack  Commonly known as:  PROTONIX  Place 20 mLs (40 mg total) into feeding tube daily.     sodium bicarbonate 650 MG tablet  Take 650 mg by mouth 2 (two) times daily.  warfarin 4 MG tablet  Commonly known as:  COUMADIN  Take 4 mg by mouth every evening.        Review of Systems  Constitutional: Negative.   HENT: Negative.   Eyes: Negative.   Respiratory: Negative.   Cardiovascular: Negative.   Gastrointestinal: Negative.   Endocrine: Negative.   Genitourinary: Negative.   Musculoskeletal: Negative.   Skin: Negative.   Allergic/Immunologic: Negative.   Neurological: Negative.   Hematological: Negative.   Psychiatric/Behavioral: Negative.     Immunization History  Administered Date(s) Administered  . Influenza-Unspecified 11/29/2012, 12/15/2013  . PPD Test 01/21/2012   Pertinent  Health Maintenance Due  Topic Date Due  . FOOT EXAM  08/04/1951  . OPHTHALMOLOGY EXAM  08/04/1951  . URINE MICROALBUMIN  08/04/1951  . COLONOSCOPY  08/04/1991  . PNA vac Low Risk Adult (1 of 2 - PCV13) 08/04/2006  . HEMOGLOBIN A1C  08/09/2015  . INFLUENZA VACCINE  09/17/2015   No flowsheet data found. Functional Status Survey:    Filed Vitals:    06/21/15 1318  BP: 136/80  Pulse: 71  Temp: 97.3 F (36.3 C)  TempSrc: Oral  Resp: 20  Weight: 187 lb (84.823 kg)   Body mass index is 26.83 kg/(m^2). Physical Exam  Constitutional: He is oriented to person, place, and time. He appears well-developed and well-nourished. No distress.  HENT:  Head: Normocephalic.  Mouth/Throat: Oropharynx is clear and moist. No oropharyngeal exudate.  Eyes: Conjunctivae and EOM are normal. Pupils are equal, round, and reactive to light. Right eye exhibits no discharge.  Neck: Normal range of motion. Neck supple.  Cardiovascular: Normal rate and regular rhythm.  Exam reveals no gallop and no friction rub.   No murmur heard. Pulmonary/Chest: Effort normal and breath sounds normal. No respiratory distress. He has no wheezes. He has no rales.  Abdominal: Soft. Bowel sounds are normal. There is no tenderness.  Musculoskeletal: Normal range of motion. He exhibits no edema or tenderness.  Lymphadenopathy:    He has no cervical adenopathy.  Neurological: He is alert and oriented to person, place, and time. No cranial nerve deficit.  Skin: Skin is warm and dry. He is not diaphoretic.    Labs reviewed:  Recent Labs  03/21/15  NA 139  K 4.5  BUN 26*  CREATININE 2.2*    Recent Labs  03/21/15  AST 20  ALT 17  ALKPHOS 101    Recent Labs  03/21/15  WBC 4.4  HGB 12.1*  HCT 38*  PLT 188   Lab Results  Component Value Date   TSH 1.14 03/21/2015   No results found for: HGBA1C Lab Results  Component Value Date   TRIG 296* 01/07/2012    Significant Diagnostic Results in last 30 days:  No results found.  Assessment/Plan There are no diagnoses linked to this encounter.      Wille Celeste, [place] [phone]

## 2015-06-21 NOTE — Progress Notes (Signed)
Patient ID: [name], male   DOB: 10-02-1940, 75 y.o.   MRN: [id number]   MRN: [id number] Name: Noah Torres  Sex: male Age: 75 y.o. DOB: 1940/05/15  [facility] Facility/Room:422 Level Of Care: SNF Provider: Oralia Manis Emergency Contacts: Extended Emergency Contact Information Primary Emergency Contact: [place],  [place] Home Phone: [phone] Relation: None Secondary Emergency Contact: Jones,[name] Address: [address]          [place], [place] [address] [name] of [place] Phone: [phone] Relation: Other  Code Status: FULL  Allergies: Metformin and related  Chief Complaint  Patient presents with  . Medical Management of Chronic Issues   including chronic kidney disease-history of NPH-seizure disorder-diabetes type 2-bladder carcinoma-hypertension-depression  HPI: Patient is 75 y.o. male who iwas admitted to SNF after being hospitalized for PNA, CKD and hypernatremia/dehydration. Patient has had a complicated medical history but nonetheless appears to have stabilized.  He does have a history of suspected seizures is on Keppra twice a day.  Also a type II diabetic on Lantus 22 units daily as well as Humalog sliding scale insulin- Blood sugars at times appear to be somewhat low although he is asymptomatic apparently in the morning-I see recent morning sugars ranging from 54 into the low 100s-later in the day sugars appear to be more in the low to higher 100 range.  .  Patient also has a history of a bladder cancer this is considered inoperable he has had palliative radiation in the past-not thought to be a candidate for chemotherapy-nonetheless he appears to be stable in this regards  Patient does have a history of NPH-as well as a pulmonary embolism he is on chronic Coumadin.-INR is being monitored   Today he has no acute complaints he is a poor historianand however nursing staff does not report any acute issues--he  actually has enjoyed a fairly stable. Here he is up in his wheelchair today is alert talking more than I seen him in some time appears to be eating well when I saw him at lunch    Past Medical History  Diagnosis Date  . Diabetes mellitus, type 2 ([place])   . Hypercholesterolemia   . Former smoker   . Bilateral hearing loss     Wears hearing aids  . Chronic kidney disease   . Hearing loss     uses hearing aids  . Seizures ([place]) 09/13/2013    Past Surgical History  Procedure Laterality Date  . Spine surgery        Medication List       This list is accurate as of: 06/21/15  1:43 PM.  Always use your most recent med list.               amLODipine 2.5 MG tablet  Commonly known as:  NORVASC  Take 2.5 mg by mouth daily.     carvedilol 12.5 MG tablet  Commonly known as:  COREG  Place 1 tablet (12.5 mg total) into feeding tube 2 (two) times daily with a meal.     escitalopram 10 MG tablet  Commonly known as:  LEXAPRO  Take 10 mg by mouth daily. For depression     ferrous sulfate 325 (65 FE) MG tablet  Take 325 mg by mouth 2 (two) times daily with a meal.     HUMALOG KWIKPEN 100 UNIT/ML KiwkPen  Generic drug:  insulin lispro  Give sliding scale  Insulin  before meals as follows 175-300=5 units, 301-400=8 units, 401-500=10 units, call MD if greater than 500     insulin glargine 100 UNIT/ML injection  Commonly known as:  LANTUS  Inject 22 Units into the skin at bedtime.     levETIRAcetam 250 MG tablet  Commonly known as:  KEPPRA  Take 250 mg by mouth daily.     levETIRAcetam 750 MG tablet  Commonly known as:  KEPPRA  Take 750 mg by mouth daily.     MULTIVITAMIN ADULT PO  Give 1 tablet by mouth daily     pantoprazole sodium 40 mg/20 mL Pack  Commonly known as:  PROTONIX  Place 20 mLs (40 mg total) into feeding tube daily.     sodium bicarbonate 650 MG tablet  Take 650 mg by mouth 2 (two) times daily.     warfarin 4 MG tablet  Commonly known as:  COUMADIN  Take  4 mg by mouth every evening.        Meds ordered this encounter  Medications  . Multiple Vitamins-Minerals (MULTIVITAMIN ADULT PO)    Sig: Give 1 tablet by mouth daily  . levETIRAcetam (KEPPRA) 250 MG tablet    Sig: Take 250 mg by mouth daily.  Marland Kitchen warfarin (COUMADIN) 4 MG tablet    Sig: Take 4 mg by mouth every evening.  . levETIRAcetam (KEPPRA) 750 MG tablet    Sig: Take 750 mg by mouth daily.  . insulin lispro (HUMALOG KWIKPEN) 100 UNIT/ML KiwkPen    Sig: Give sliding scale  Insulin before meals as follows 175-300=5 units, 301-400=8 units, 401-500=10 units, call MD if greater than 500    Immunization History  Administered Date(s) Administered  . Influenza-Unspecified 11/29/2012, 12/15/2013  . PPD Test 01/21/2012    Social History  Substance Use Topics  . Smoking status: Former Research scientist (life sciences)  . Smokeless tobacco: Not on file  . Alcohol Use: No    Family history is noncontributory    Review of Systems  DATA OBTAINED: from patient, nurse GENERAL:  no fevers, fatigue, appetite changes SKIN: No itching, rash EYES: No eye pain, redness, discharge EARS: No earache, tinnitus, change in hearing NOSE: No congestion, drainage or bleeding  MOUTH/THROAT: No mouth or tooth pain, No sore throat RESPIRATORY: No cough, wheezing, SOB CARDIAC: No chest pain, palpitations, lower extremity edema  GI: No abdominal pain, No N/V/D or constipation, No heartburn or reflux  GU: No dysuria, frequency or urgency, or incontinence  MUSCULOSKELETAL: No unrelieved bone/joint pain NEUROLOGIC: No headache, dizziness or focal weakness PSYCHIATRIC: No overt anxiety or sadness, No behavior issue.   Filed Vitals:   06/21/15 1318  BP: 136/80  Pulse: 71  Temp: 97.3 F (36.3 C)  Resp: 20    Physical Exam  GENERAL APPEARANCE: Alert, somewhat conversant,  No acute distressSitting comfortably in his wheelchair.  SKIN: No diaphoresis rash HEAD: Normocephalic, atraumatic  EYES: Conjunctiva/lids clear.  Pupils round, reactive. EOMs intact.  EARS: External exam WNL, canals clear. Hearing grossly normal.  NOSE: No deformity or discharge.  MOUTH/THROAT: Oropharynx clear mucous membranes moist RESPIRATORY: Breathing is even, unlabored. Lung sounds are clear   CARDIOVASCULAR: Heart RRR no murmurs, rubs or gallops. minimal peripheral edema.   GASTROINTESTINAL: Abdomen is soft, non-tender, not distended w/ normal bowel sounds.  MUSCULOSKELETAL: No abnormal joints or musculature general frailty and weakness but is able to move all extremities 4 with mild right sided weakness which is baseline NEUROLOGIC:  Cranial nerves 2-12 grossly intact--history of mild right-sided weakness but  this is stable PSYCHIATRIC: pt , at his baseline, no behavioral issues--alert and most talkative I have seen him in a while   Patient Active Problem List   Diagnosis Date Noted  . CKD (chronic kidney disease) stage 4, GFR 15-29 ml/min (HCC) 08/10/2014  . Metabolic acidosis [id number]  . Hypernatremia 08/10/2014  . Hyperglycemia 08/03/2014  . HCAP (healthcare-associated pneumonia) 08/03/2014  . Occasional tremors 08/02/2014  . Type 2 diabetes with nephropathy ([place]) 01/02/2014  . Mass of bladder 12/11/2013  . Pain in joint, lower leg 11/12/2013  . DVT (deep venous thrombosis) ([place]) 11/09/2013  . Seizures ([place]) 11/09/2013  . Altered mental status 10/16/2013  . Weakness 10/16/2013  . New onset seizure ([place]) 09/13/2013  . Dizziness 07/19/2013  . Fever presenting with conditions classified elsewhere 04/05/2013  . Cough 04/05/2013  . Type II or unspecified type diabetes mellitus with renal manifestations, uncontrolled 04/01/2013  . Thrush 03/01/2013  . Anemia, iron deficiency 09/22/2012  . Benign renovascular hypertension 07/26/2012  . Diabetes mellitus type 2 with complications ([place]) 0000000  . GERD (gastroesophageal reflux disease) 07/04/2012  . Chronic anticoagulation 07/04/2012  . Anxiety state, unspecified  07/04/2012  . Essential hypertension, benign 07/04/2012  . Type II or unspecified type diabetes mellitus without mention of complication, not stated as uncontrolled 07/04/2012  . Esophageal reflux 07/04/2012  . History of pulmonary embolism 07/04/2012  . Long term current use of anticoagulant therapy 05/09/2012  . HTN (hypertension) 01/21/2012  . DM type 2 (diabetes mellitus, type 2) ([place]) 01/21/2012  . Urinary retention 01/21/2012  . Encephalopathy 01/09/2012  . Normal pressure hydrocephalus 01/09/2012  . Acute respiratory failure ([place]) 01/04/2012  . PE (pulmonary thromboembolism) ([place]) 01/04/2012  . Cardiac arrest ([place]) 01/04/2012  . Acute renal failure ([place]) 01/04/2012     Labs.     CBC    Component Value Date/Time   WBC 4.4 03/21/2015   WBC 5.1 01/21/2012 0720   RBC 3.72* 01/21/2012 0720   HGB 12.1* 03/21/2015   HCT 38* 03/21/2015   PLT 188 03/21/2015   MCV 81.5 01/21/2012 0720    CMP     Component Value Date/Time   NA 139 03/21/2015   NA 139 01/21/2012 0720   K 4.5 03/21/2015   CL 108 01/21/2012 0720   CO2 18* 01/21/2012 0720   GLUCOSE 185* 01/21/2012 0720   BUN 26* 03/21/2015   BUN 53* 01/21/2012 0720   CREATININE 2.2* 03/21/2015   CREATININE 4.16* 01/21/2012 0720   CALCIUM 8.5 01/21/2012 0720   PROT 7.7 01/15/2012 0440   ALBUMIN 2.1* 01/21/2012 0720   AST 20 03/21/2015   ALT 17 03/21/2015   ALKPHOS 101 03/21/2015   BILITOT 0.4 01/15/2012 0440   GFRNONAA 13* 01/21/2012 0720   GFRAA 15* 01/21/2012 0720    Assessment and Plan   [id number] of metabolic acidosis  this has been stable will update a basic metabolic panel he continues on sodium bicarbonate.  #2 history seizure disorder this has been stable on some time on Keppra. #3 history of chronic kidney disease --recent  creatinines have been largely in the mid twos we will update this--most recent creatinine 2.2 on lab done in February  #4 history of pneumonia this was treated in the hospital  there has been no recurrence although I suspect he remains at risk.  #5 history of diabetes morning sugars are somewhat low I see occasional in the 50s although this is not frequent but he does have 70s as well-will reduce his Lantus  to 17 units from 22 units-would like to avoid any hypoglycemia here clinically appears to be stable may have to titrate this further depending on what his morning blood sugars run  #6 history of hypertension-this appears stable he is on Coreg 12.5 mg twice a day as well as Norvasc 2.5 mg a day --recent blood pressures 136/80-120/71-146/83-120/72  #7 history of anemia most likely chronic disease he is on iron twice a day Will update a hemoglobin last hemoglobin second show stability at 12.1  ,  #8 history of pulmonary embolism he is on chronic Coumadin--this has been in the therapeutic range Dr. Sheppard Coil has ordered an update I do not see any evidence of increased bruising or bleeding.  History of GERD this appears stable he is on Protonix.  #10 history of bladder cancer as noted above not thought to be an aggressive candidate for chemotherapy he has received palliative radiation in the past-despite this he appears clinically to be stable he does not appear to be in discomfort or pain -clinically actually appears to be improving somewhat he is more alert and interactive smiling up in his wheelchair  #11-history of NPH mentally he appears to be at baseline today actually somewhat improved---most recent MRI apparently showed stability.   [id number] note greater than 35 minutes spent assessing patient-reviewing his chart-his labs-discussing his status with nursing staff-and coordinating and formulating plan of care for numerous diagnoses-of note greater than 50% of time spent coordinating plan of care

## 2015-07-05 ENCOUNTER — Non-Acute Institutional Stay (SKILLED_NURSING_FACILITY): Payer: Medicare Other | Admitting: Internal Medicine

## 2015-07-05 ENCOUNTER — Encounter: Payer: Self-pay | Admitting: Internal Medicine

## 2015-07-05 DIAGNOSIS — Z7901 Long term (current) use of anticoagulants: Secondary | ICD-10-CM

## 2015-07-05 NOTE — Progress Notes (Signed)
MRN: [id number] Name: Noah Torres  Sex: male Age: 75 y.o. DOB: [birth year]  [place] #: [facility] Facility/Room: Level Of Care: SNF Provider: Inocencio Homes D Emergency Contacts: Extended Emergency Contact Information Primary Emergency Contact: [place],  [place] Home Phone: [phone] Relation: None Secondary Emergency Contact: Jones,[name] Address: [address]          [place], [place] [address] [name] of [place] Phone: [phone] Relation: Other  Code Status:   Allergies: Metformin and related  Chief Complaint  Patient presents with  . Acute Visit    HPI: Patient is 75 y.o. male who is being seen to address his anticoagulation issues. Pt's INR's have been very difficult to control recently without the influence of outside factors like antibiotics, change in meds or diet. No one here even knows why he is on chronic anticoagulation. Review of records shows pt was dx with DVT and saddle embolus in 01/2012. He went into PEA arrest 2/2 embolus. In 10/2013 pt was sent to ED where he was dx with DVT but it was not possible to tell if it was acute or chronic so he was treated as if it was acute. So he definitely meets criteria for chronic anticoagulation.  Past Medical History  Diagnosis Date  . Diabetes mellitus, type 2 ([place])   . Hypercholesterolemia   . Former smoker   . Bilateral hearing loss     Wears hearing aids  . Chronic kidney disease   . Hearing loss     uses hearing aids  . Seizures ([place]) 09/13/2013    Past Surgical History  Procedure Laterality Date  . Spine surgery        Medication List       This list is accurate as of: 07/05/15 11:10 PM.  Always use your most recent med list.               amLODipine 2.5 MG tablet  Commonly known as:  NORVASC  Take 2.5 mg by mouth daily.     carvedilol 12.5 MG tablet  Commonly known as:  COREG  Place 1 tablet (12.5 mg total) into feeding tube 2 (two) times daily with a meal.     escitalopram 10 MG tablet  Commonly known as:  LEXAPRO  Take 10 mg by mouth daily. For depression     ferrous sulfate 325 (65 FE) MG tablet  Take 325 mg by mouth 2 (two) times daily with a meal.     HUMALOG KWIKPEN 100 UNIT/ML KiwkPen  Generic drug:  insulin lispro  Give sliding scale  Insulin before meals as follows 175-300=5 units, 301-400=8 units, 401-500=10 units, call MD if greater than 500     insulin glargine 100 UNIT/ML injection  Commonly known as:  LANTUS  Inject 22 Units into the skin at bedtime.     levETIRAcetam 250 MG tablet  Commonly known as:  KEPPRA  Take 250 mg by mouth daily.     levETIRAcetam 750 MG tablet  Commonly known as:  KEPPRA  Take 750 mg by mouth daily.     MULTIVITAMIN ADULT PO  Give 1 tablet by mouth daily     pantoprazole sodium 40 mg/20 mL Pack  Commonly known as:  PROTONIX  Place 20 mLs (40 mg total) into feeding tube daily.     sodium bicarbonate 650 MG tablet  Take 650 mg by mouth 2 (two) times daily.     warfarin 4  MG tablet  Commonly known as:  COUMADIN  Take 4 mg by mouth every evening.        No orders of the defined types were placed in this encounter.    Immunization History  Administered Date(s) Administered  . Influenza-Unspecified 11/29/2012, 12/15/2013  . PPD Test 01/21/2012    Social History  Substance Use Topics  . Smoking status: Former Research scientist (life sciences)  . Smokeless tobacco: Not on file  . Alcohol Use: No    Review of Systems UTO 2/2 dementia    Filed Vitals:   07/05/15 2308  BP: 136/80  Pulse: 68  Temp: 97.3 F (36.3 C)  Resp: 18    Physical Exam  GENERAL APPEARANCE: Alert, min conversant, No acute distress  SKIN: No diaphoresis rash HEENT: Unremarkable RESPIRATORY: Breathing is even, unlabored. Lung sounds are clear   CARDIOVASCULAR: Heart RRR no murmurs, rubs or gallops. No peripheral edema  GASTROINTESTINAL: Abdomen is soft, non-tender, not distended w/ normal bowel sounds.  GENITOURINARY: Bladder  non tender, not distended  MUSCULOSKELETAL: No abnormal joints or musculature NEUROLOGIC: Cranial nerves 2-12 grossly intact. Moves all extremities PSYCHIATRIC: dementia, no behavioral issues  Patient Active Problem List   Diagnosis Date Noted  . CKD (chronic kidney disease) stage 4, GFR 15-29 ml/min (HCC) 08/10/2014  . Metabolic acidosis [id number]  . Hypernatremia 08/10/2014  . Hyperglycemia 08/03/2014  . HCAP (healthcare-associated pneumonia) 08/03/2014  . Occasional tremors 08/02/2014  . Type 2 diabetes with nephropathy ([place]) 01/02/2014  . Mass of bladder 12/11/2013  . Pain in joint, lower leg 11/12/2013  . DVT (deep venous thrombosis) ([place]) 11/09/2013  . Seizures ([place]) 11/09/2013  . Altered mental status 10/16/2013  . Weakness 10/16/2013  . New onset seizure ([place]) 09/13/2013  . Dizziness 07/19/2013  . Fever presenting with conditions classified elsewhere 04/05/2013  . Cough 04/05/2013  . Type II or unspecified type diabetes mellitus with renal manifestations, uncontrolled 04/01/2013  . Thrush 03/01/2013  . Anemia, iron deficiency 09/22/2012  . Benign renovascular hypertension 07/26/2012  . Diabetes mellitus type 2 with complications ([place]) 0000000  . GERD (gastroesophageal reflux disease) 07/04/2012  . Chronic anticoagulation 07/04/2012  . Anxiety state, unspecified 07/04/2012  . Essential hypertension, benign 07/04/2012  . Type II or unspecified type diabetes mellitus without mention of complication, not stated as uncontrolled 07/04/2012  . Esophageal reflux 07/04/2012  . History of pulmonary embolism 07/04/2012  . Long term current use of anticoagulant therapy 05/09/2012  . HTN (hypertension) 01/21/2012  . DM type 2 (diabetes mellitus, type 2) ([place]) 01/21/2012  . Urinary retention 01/21/2012  . Encephalopathy 01/09/2012  . Normal pressure hydrocephalus 01/09/2012  . Acute respiratory failure ([place]) 01/04/2012  . PE (pulmonary thromboembolism) ([place]) 01/04/2012  .  Cardiac arrest ([place]) 01/04/2012  . Acute renal failure ([place]) 01/04/2012    CBC    Component Value Date/Time   WBC 4.4 03/21/2015   WBC 5.1 01/21/2012 0720   RBC 3.72* 01/21/2012 0720   HGB 12.1* 03/21/2015   HCT 38* 03/21/2015   PLT 188 03/21/2015   MCV 81.5 01/21/2012 0720    CMP     Component Value Date/Time   NA 139 03/21/2015   NA 139 01/21/2012 0720   K 4.5 03/21/2015   CL 108 01/21/2012 0720   CO2 18* 01/21/2012 0720   GLUCOSE 185* 01/21/2012 0720   BUN 26* 03/21/2015   BUN 53* 01/21/2012 0720   CREATININE 2.2* 03/21/2015   CREATININE 4.16* 01/21/2012 0720   CALCIUM 8.5 01/21/2012 0720  PROT 7.7 01/15/2012 0440   ALBUMIN 2.1* 01/21/2012 0720   AST 20 03/21/2015   ALT 17 03/21/2015   ALKPHOS 101 03/21/2015   BILITOT 0.4 01/15/2012 0440   GFRNONAA 13* 01/21/2012 0720   GFRAA 15* 01/21/2012 0720    Assessment and Plan  LONG TERM USE OF ANTICOAGULATION - pt meets criteria due to both having a symptomatic PE (PEA arrest-doesn't get more symptomatic than that) and recurrent DVT. Pt's coumadin has been difficult to control now and in the past and he is being stick way more that quality of life would dictate. He has issues with CRF, latest Cr 2.2, at baseline and Cr Cl is 29 so eventually pt will have to go back on coumadin but for now will switch him over to eliquis for a coumadin holiday. . Pt's INR is 4.1 today, will wait until it falls to < 2 then start eliquis 2.5 mg BID. Will monitor BMP at intervals    Time spent > 35 min;> 50% of time with patient was spent reviewing records, labs, tests and studies, counseling and developing plan of care  Hennie Duos, MD

## 2015-07-16 LAB — HEPATIC FUNCTION PANEL
ALT: 20 U/L (ref 10–40)
AST: 22 U/L (ref 14–40)
Alkaline Phosphatase: 145 U/L — AB (ref 25–125)
Bilirubin, Total: 0.2 mg/dL

## 2015-07-16 LAB — CBC AND DIFFERENTIAL
HCT: 36 % — AB (ref 41–53)
HEMOGLOBIN: 11.9 g/dL — AB (ref 13.5–17.5)
PLATELETS: 167 10*3/uL (ref 150–399)
WBC: 3.3 10*3/mL

## 2015-07-16 LAB — BASIC METABOLIC PANEL
BUN: 34 mg/dL — AB (ref 4–21)
Creatinine: 2.3 mg/dL — AB (ref 0.6–1.3)
GLUCOSE: 88 mg/dL
POTASSIUM: 4.9 mmol/L (ref 3.4–5.3)
Sodium: 138 mmol/L (ref 137–147)

## 2015-07-16 LAB — HEMOGLOBIN A1C: HEMOGLOBIN A1C: 9

## 2015-07-18 ENCOUNTER — Encounter: Payer: Self-pay | Admitting: Internal Medicine

## 2015-07-18 ENCOUNTER — Non-Acute Institutional Stay (SKILLED_NURSING_FACILITY): Payer: Medicare Other | Admitting: Internal Medicine

## 2015-07-18 DIAGNOSIS — H109 Unspecified conjunctivitis: Secondary | ICD-10-CM | POA: Insufficient documentation

## 2015-07-18 DIAGNOSIS — Z86718 Personal history of other venous thrombosis and embolism: Secondary | ICD-10-CM | POA: Diagnosis not present

## 2015-07-18 DIAGNOSIS — G912 (Idiopathic) normal pressure hydrocephalus: Secondary | ICD-10-CM | POA: Diagnosis not present

## 2015-07-18 DIAGNOSIS — I10 Essential (primary) hypertension: Secondary | ICD-10-CM | POA: Diagnosis not present

## 2015-07-18 DIAGNOSIS — N184 Chronic kidney disease, stage 4 (severe): Secondary | ICD-10-CM

## 2015-07-18 NOTE — Progress Notes (Signed)
Location:  [facility] Room Number: 422/D Place of Service:  SNF [phone]) Provider:  Granville Lewis  No primary care provider on file.  No care team member to display  Extended Emergency Contact Information Primary Emergency Contact: [place],  [place] Phone: [phone] Relation: None Secondary Emergency Contact: Jones,[name] Address: [address]          [place], [place] [address] [name] of [place] Phone: [phone] Relation: Other   Code Status:  Full Code Goals of care: Advanced Directive information Advanced Directives 07/18/2015  Does patient have an advance directive? Yes  Type of Advance Directive -  Does patient want to make changes to advanced directive? No - Patient declined  Copy of advanced directive(s) in chart? Yes     Chief Complaint  Patient presents with  . Acute Visit    Patients c/o Both eyes red ,ichy,and have drainage    HPI:  Pt is a 75 y.o. male seen today for an acute visit for Erythema of his conjunctivae bilaterally apparently itching eyes and some clear drainage.  He does not complain of any visual changes.  Clinically he appears to be stable he does have a history of bladder cancer which is being managed conservatively with no aggressive treatment.  Also has a history of NPH--and dementia with mental status changes but he did appears to be doing better here he is now more alert talking ambulating in wheelchair has had an extended period of stability in this regards   At times he has had blood pressure issues with this appears stabilized as well recent blood pressures 130/92-137/72 121/71-she is on Coreg 12.5 mg twice a day and Norvasc 2.5 mg daily.  He does have a history of DVT and pulmonary embolism in the past this has been addressed previously by Dr. Adella Nissen Coumadin at times has been quite difficult to regulate and  has been switchedto Eliquest   for a Coumadin holiday  appears to be tolerating this well    Past Medical History  Diagnosis Date  . Diabetes mellitus, type 2 ([place])   . Hypercholesterolemia   . Former smoker   . Bilateral hearing loss     Wears hearing aids  . Chronic kidney disease   . Hearing loss     uses hearing aids  . Seizures ([place]) 09/13/2013   Past Surgical History  Procedure Laterality Date  . Spine surgery      Allergies  Allergen Reactions  . Metformin And Related       Medication List       This list is accurate as of: 07/18/15  1:12 PM.  Always use your most recent med list.               amLODipine 2.5 MG tablet  Commonly known as:  NORVASC  Take 2.5 mg by mouth daily.     carvedilol 12.5 MG tablet  Commonly known as:  COREG  Place 1 tablet (12.5 mg total) into feeding tube 2 (two) times daily with a meal.     ELIQUIS 2.5 MG Tabs tablet  Generic drug:  apixaban  Take 2.5 mg by mouth 2 (two) times daily.     escitalopram 10 MG tablet  Commonly known as:  LEXAPRO  Take 10 mg by mouth daily. For depression     ferrous sulfate 325 (65 FE) MG tablet  Take 325 mg by mouth  2 (two) times daily with a meal.     HUMALOG KWIKPEN 100 UNIT/ML KiwkPen  Generic drug:  insulin lispro  Give sliding scale  Insulin before meals as follows 175-300=5 units, 301-400=8 units, 401-500=10 units, call MD if greater than 500     insulin glargine 100 UNIT/ML injection  Commonly known as:  LANTUS  Inject 22 Units into the skin at bedtime.     levETIRAcetam 250 MG tablet  Commonly known as:  KEPPRA  Take 250 mg by mouth daily.     levETIRAcetam 750 MG tablet  Commonly known as:  KEPPRA  Take 750 mg by mouth daily.     MULTIVITAMIN ADULT PO  Give 1 tablet by mouth daily     pantoprazole sodium 40 mg/20 mL Pack  Commonly known as:  PROTONIX  Place 20 mLs (40 mg total) into feeding tube daily.     sodium bicarbonate 650 MG tablet  Take 650 mg by mouth 2 (two) times daily.     warfarin 4 MG tablet  Commonly  known as:  COUMADIN  Take 4 mg by mouth every evening. Reported on 07/18/2015      Of note Coumadin has been discontinued Review of Systems   Obtain from patient and nursing patient is a poor historian.  In general no complaints fever or chills.  Skin no complaints or rashes or itching.  Eyes changes as noted above he has some irritated eyes erythema drainage denies visual changes.  #480 denies any shortness of breath or cough.  Cardiac no chest pain.  GI does not complain of abdominal discomfort nausea vomiting diarrhea constipation.  Musculoskeletal does not complain of joint pain  Neurologic is not complaining of dizziness headache does have a history seizures but this appears to have stabilized somewhat.  Psych does have a history of some dementia confusion but he is more bright alert talkative than I have seen in some time    Immunization History  Administered Date(s) Administered  . Influenza-Unspecified 11/29/2012, 12/15/2013, 11/16/2014  . PPD Test 01/21/2012   Pertinent  Health Maintenance Due  Topic Date Due  . FOOT EXAM  07/17/2016 (Originally 08/04/1950)  . OPHTHALMOLOGY EXAM  07/17/2016 (Originally 08/04/1950)  . URINE MICROALBUMIN  07/17/2016 (Originally 08/04/1950)  . COLONOSCOPY  07/17/2016 (Originally 08/04/1991)  . PNA vac Low Risk Adult (1 of 2 - PCV13) 07/17/2016 (Originally 08/04/2006)  . HEMOGLOBIN A1C  08/09/2015  . INFLUENZA VACCINE  09/17/2015   No flowsheet data found. Functional Status Survey:    Filed Vitals:   07/18/15 0909  BP: 121/71  Pulse: 56  Temp: 98 F (36.7 C)  TempSrc: Oral  Resp: 18  Weight: 187 lb (84.823 kg)   Body mass index is 26.83 kg/(m^2). Physical Exam   In general this is a pleasant elderly male in no distress he is smiling sitting in his wheelchair.  His skin is warm and dry.  Eyes he does have erythematous conjunctiva bilaterally pupils appear reactive to light visual acuity appears grossly intact-she does have  clear drainage bilaterally I do not see any exudate.  Oropharynx clear mucous membranes moist.  Chest is clear to auscultation there is no labored breathing.  Heart is regular rate and rhythm slightly bradycardic at 58 he does not have significant lower extremity edema.  Abdomen soft nontender positive bowel sounds.  Musculoskeletal he is able to move all extremities 4 ambulates in a wheelchair do not really appreciate significant lateralizing findings.  Neurologic as noted above nerves  appear grossly intact he is somewhat more talkative than I have seen before.  Psych he is oriented to self does follow simple verbal commands is pleasant smiling actually corrected me what I stated I thought he was eating spent spinich for lunch-he told me they were greens    February   second 2017.  Sodium 139 potassium 4.5 BUN 26 creatinine 2.2.  TSH-1.14.  WBC 4.4 hemoglobin 12.1 platelets 188   Recent Labs  03/21/15  AST 20  ALT 17  ALKPHOS 101    Recent Labs  03/21/15  WBC 4.4  HGB 12.1*  HCT 38*  PLT 188   Lab Results  Component Value Date   TSH 1.14 03/21/2015   No results found for: HGBA1C Lab Results  Component Value Date   TRIG 296* 01/07/2012    Significant Diagnostic Results in last 30 days:  No results found.  Assessment/Plan  #1 conjunctivitis this appears to be more allergic conjunctivitis will start Patanol 1 drop twice a day to both eyes 7 days monitor for resolution.  #2 history of renal insufficiency creatinine 2.2 appears relatively at baseline updated labs were ordered on May 5 will write an order to obtain those results if not obtained obtained next lab day.  #3 hypertension this appears stabilized as noted above currently on Norvasc 2.5 mg a day as well as Coreg 12.5 mg twice a day.  #4 history of DVT-again he is now on Eliquist he is receiving Coumadin holiday --appears to be tolerating this unremarkably.  #5-history of NPH-dementia-again he  appears to be doing quite well here he is more alert talks more appears to be more interactive.  [id number] Oralia Manis, [place]

## 2015-08-19 ENCOUNTER — Non-Acute Institutional Stay (SKILLED_NURSING_FACILITY): Payer: Medicare Other | Admitting: Internal Medicine

## 2015-08-19 DIAGNOSIS — G912 (Idiopathic) normal pressure hydrocephalus: Secondary | ICD-10-CM | POA: Diagnosis not present

## 2015-08-19 DIAGNOSIS — N10 Acute tubulo-interstitial nephritis: Secondary | ICD-10-CM | POA: Diagnosis not present

## 2015-08-19 DIAGNOSIS — N184 Chronic kidney disease, stage 4 (severe): Secondary | ICD-10-CM | POA: Diagnosis not present

## 2015-08-19 NOTE — Progress Notes (Signed)
Patient ID: Noah Torres, male   DOB: 09-11-1940, 75 y.o.   MRN: [id number]  Location:      Place of Service:  SNF (31) Provider:  Granville Lewis  No primary care provider on file.  No care team member to display  Extended Emergency Contact Information Primary Emergency Contact: [place],  [place] Phone: [phone] Relation: None Secondary Emergency Contact: Jones,[name] Address: [address]          [place], [place] [address] [name] of [place] Phone: [phone] Relation: Other   Code Status:  Full Code Goals of care: Advanced Directive information Advanced Directives 07/18/2015  Does patient have an advance directive? Yes  Type of Advance Directive -  Does patient want to make changes to advanced directive? No - Patient declined  Copy of advanced directive(s) in chart? Yes     Chief Complaint  Patient presents with  . Acute Visit  Secondary to UTI  HPI:  Pt is a 75 y.o. male seen today for follow-up of UTI-patient apparently recently had some increased behaviors and agitation and a urine culture was obtained which is grown out greater than 100,000 colonies of gram-negative rods we do not have the  bacteria o specific r sensitivities yet.  He is afebrile he is a poor historian secondary to dementia but is not complaining of any fever or chills or overt dysuria   Also has a history of NPH--and dementia with mental status changes but he did appears to be doing better here he is now more alert talking ambulating in wheelchair has had an extended period of stability in this regards      He does have a history of DVT and pulmonary embolism in the past this has been addressed previously by Dr. Adella Nissen Coumadin at times has been quite difficult to regulate and  has been switchedto Eliquest   for a Coumadin holiday appears to be tolerating this well    Past Medical History  Diagnosis Date  . Diabetes mellitus, type 2 ([place])   .  Hypercholesterolemia   . Former smoker   . Bilateral hearing loss     Wears hearing aids  . Chronic kidney disease   . Hearing loss     uses hearing aids  . Seizures ([place]) 09/13/2013   Past Surgical History  Procedure Laterality Date  . Spine surgery      Allergies  Allergen Reactions  . Metformin And Related       Medication List       This list is accurate as of: 08/19/15 11:59 PM.  Always use your most recent med list.               amLODipine 2.5 MG tablet  Commonly known as:  NORVASC  Take 2.5 mg by mouth daily.     carvedilol 12.5 MG tablet  Commonly known as:  COREG  Place 1 tablet (12.5 mg total) into feeding tube 2 (two) times daily with a meal.     ELIQUIS 2.5 MG Tabs tablet  Generic drug:  apixaban  Take 2.5 mg by mouth 2 (two) times daily.     escitalopram 10 MG tablet  Commonly known as:  LEXAPRO  Take 10 mg by mouth daily. For depression     ferrous sulfate 325 (65 FE) MG tablet  Take 325 mg by mouth 2 (two) times daily with a meal.     HUMALOG KWIKPEN 100  UNIT/ML KiwkPen  Generic drug:  insulin lispro  Give sliding scale  Insulin before meals as follows 175-300=5 units, 301-400=8 units, 401-500=10 units, call MD if greater than 500     insulin glargine 100 UNIT/ML injection  Commonly known as:  LANTUS  Inject 22 Units into the skin at bedtime.     levETIRAcetam 250 MG tablet  Commonly known as:  KEPPRA  Take 250 mg by mouth daily.     levETIRAcetam 750 MG tablet  Commonly known as:  KEPPRA  Take 750 mg by mouth daily.     MULTIVITAMIN ADULT PO  Give 1 tablet by mouth daily     pantoprazole sodium 40 mg/20 mL Pack  Commonly known as:  PROTONIX  Place 20 mLs (40 mg total) into feeding tube daily.     sodium bicarbonate 650 MG tablet  Take 650 mg by mouth 2 (two) times daily.      Of note Coumadin has been discontinued Review of Systems   Obtain from patient and nursing patient is a poor historian.  In general no complaints fever  or chills.  Skin no complaints or rashes or itching.    Resp-denies any shortness of breath or cough.  Cardiac no chest pain.  GI does not complain of abdominal discomfort nausea vomiting diarrhea constipation.  GU-urine culture suspicious for UTI apparently there've been some behaviors not overtly complaining of dysuria  Musculoskeletal does not complain of joint pain  Neurologic is not complaining of dizziness headache does have a history seizures but this appears to have stabilized somewhat.  Psych does have a history of some dementia confusion and recent increased agitation    Immunization History  Administered Date(s) Administered  . Influenza-Unspecified 11/29/2012, 12/15/2013, 11/16/2014  . PPD Test 01/21/2012   Pertinent  Health Maintenance Due  Topic Date Due  . HEMOGLOBIN A1C  08/09/2015  . FOOT EXAM  07/17/2016 (Originally 08/04/1950)  . OPHTHALMOLOGY EXAM  07/17/2016 (Originally 08/04/1950)  . URINE MICROALBUMIN  07/17/2016 (Originally 08/04/1950)  . COLONOSCOPY  07/17/2016 (Originally 08/04/1991)  . PNA vac Low Risk Adult (1 of 2 - PCV13) 07/17/2016 (Originally 08/04/2006)  . INFLUENZA VACCINE  09/17/2015   No flowsheet data found. Functional Status Survey:    Filed Vitals:   08/19/15 2248  BP: 130/71  Pulse: 70  Temp: 97.5 F (36.4 C)  Resp: 18   There is no weight on file to calculate BMI. Physical Exam   In general this is a pleasant elderly male in no distress.  His skin is warm and dry.  Eyes  Sclera and conjunctiva are clear visual acuity appears grossly intact pupils are reactive to light  Oropharynx clear mucous membranes moist.  Chest is clear to auscultation there is no labored breathing.  Heart is regular rate and rhythm slightly bradycardic at 56 on exam he does not have significant lower extremity edema.  Abdomen soft nontender positive bowel sounds.  Musculoskeletal he is able to move all extremities 4 ambulates in a wheelchair  do not really appreciate significant lateralizing findings.  Neurologic as noted above nerves appear grossly intact he is somewhat more talkative than I have seen before.  Psych he is oriented to self does follow simple verbal commands is pleasant smiling   Labs.  06/26/2015.  WBC 3.3 hemoglobin 11.9 platelets 167.  Sodium 138 potassium 4.9 BUN 34 creatinine 2.25.    February   second 2017.  Sodium 139 potassium 4.5 BUN 26 creatinine 2.2.  TSH-1.14.  WBC  4.4 hemoglobin 12.1 platelets 188   Recent Labs  03/21/15  AST 20  ALT 17  ALKPHOS 101    Recent Labs  03/21/15  WBC 4.4  HGB 12.1*  HCT 38*  PLT 188   Lab Results  Component Value Date   TSH 1.14 03/21/2015   No results found for: HGBA1C Lab Results  Component Value Date   TRIG 296* 01/07/2012    Significant Diagnostic Results in last 30 days:  No results found.  Assessment/Plan  UTI-patient apparently has had some increased behaviors and agitation which is unusual for him-culture has grown out greater than 100,000 colonies gram-negative rods sensitivities and bacteria are pending-will start Keflex empirically 500 mg twice a day for 7 days and await final urine culture results and sensitivities. Also will add a probiotic twice a day for 10 days   .  #2 history of renal insufficiency creatinine 2.25 back in May shows relative stability we will update this.  #3 history of NPH-this appears to be stable mental status appears relatively stable today although apparently had some increased agitation and suspicion is this is caused by    UTI.   Of note also will obtain a CBC for updated values-hemoglobin of 11.9 back in May appears to be relatively stable  [id number].  --     Granville Lewis, PA-C [phone]

## 2015-08-23 LAB — CBC AND DIFFERENTIAL
HCT: 36 % — AB (ref 41–53)
HEMOGLOBIN: 11.8 g/dL — AB (ref 13.5–17.5)
PLATELETS: 143 10*3/uL — AB (ref 150–399)
WBC: 2.9 10^3/mL

## 2015-08-23 LAB — BASIC METABOLIC PANEL
BUN: 35 mg/dL — AB (ref 4–21)
Creatinine: 2.2 mg/dL — AB (ref 0.6–1.3)
GLUCOSE: 78 mg/dL
Potassium: 5.1 mmol/L (ref 3.4–5.3)
Sodium: 140 mmol/L (ref 137–147)

## 2015-08-28 LAB — CBC AND DIFFERENTIAL
HCT: 37 % — AB (ref 41–53)
Hemoglobin: 11.9 g/dL — AB (ref 13.5–17.5)
Platelets: 159 10*3/uL (ref 150–399)
WBC: 4 10^3/mL

## 2015-08-28 LAB — BASIC METABOLIC PANEL
BUN: 33 mg/dL — AB (ref 4–21)
CREATININE: 2.4 mg/dL — AB (ref 0.6–1.3)
Glucose: 136 mg/dL
Potassium: 5.7 mmol/L — AB (ref 3.4–5.3)
SODIUM: 138 mmol/L (ref 137–147)

## 2015-09-02 ENCOUNTER — Non-Acute Institutional Stay (SKILLED_NURSING_FACILITY): Payer: Medicare Other | Admitting: Internal Medicine

## 2015-09-02 DIAGNOSIS — E875 Hyperkalemia: Secondary | ICD-10-CM | POA: Diagnosis not present

## 2015-09-02 DIAGNOSIS — I10 Essential (primary) hypertension: Secondary | ICD-10-CM | POA: Diagnosis not present

## 2015-09-02 DIAGNOSIS — N184 Chronic kidney disease, stage 4 (severe): Secondary | ICD-10-CM | POA: Diagnosis not present

## 2015-09-02 DIAGNOSIS — R001 Bradycardia, unspecified: Secondary | ICD-10-CM

## 2015-09-02 NOTE — Progress Notes (Signed)
Patient ID: Noah Torres, male   DOB: [birth year], 75 y.o.   MRN: [id number] This is an acute visit. Level care skilled.  [facility].  Chief complaint-acute visit follow-up hyperkalemia-with history of renal insufficiency-also follow-up bradycardia  History of present illness.  Patient is a pleasant 75 year old male with a complex medical history including history of NPH and dementia as well as DVT and pulmonary embolism had been on chronic Coumadin has been switched to [place] for a Coumadin holiday.  He does have a history of renal insufficiency with baseline creatinine in the mid twos-he did have a recent lab which showed an elevated potassium on one of her there was hemolysis this was repeated and has come back mildly elevated at 5.7.  Patient continues to be quite stable and has enjoyed a period of stability he is not complaining of any arrhythmia as shortness of breath or chest pain or musculoskeletal discomfort or spasms.  He also has a history of hypertension he is on Norvasc 2.5 mg day and Coreg 12.5 mg twice a day.  His blood pressures 128/72-135/77-I do note occasionally has pulses in the 50s but he appears to be asymptomatic in this regard.  Today he is resting in bed comfortably is pleasant smiling about to eat his lunch  Past Medical History  Diagnosis Date  . Diabetes mellitus, type 2 ([place])   . Hypercholesterolemia   . Former smoker   . Bilateral hearing loss     Wears hearing aids  . Chronic kidney disease   . Hearing loss     uses hearing aids  . Seizures ([place]) 09/13/2013        Past Surgical History  Procedure Laterality Date  . Spine surgery          Allergies  Allergen Reactions  . Metformin And Related           Medication List       This list is accurate as of: 08/19/15 11:59 PM.  Always use your most recent med list.                amLODipine 2.5 MG tablet  Commonly known as:  NORVASC  Take 2.5 mg by  mouth daily.     carvedilol 12.5 MG tablet  Commonly known as:  COREG  Place 1 tablet (12.5 mg total) into feeding tube 2 (two) times daily with a meal.     ELIQUIS 2.5 MG Tabs tablet  Generic drug:  apixaban  Take 2.5 mg by mouth 2 (two) times daily.     escitalopram 10 MG tablet  Commonly known as:  LEXAPRO  Take 10 mg by mouth daily. For depression     ferrous sulfate 325 (65 FE) MG tablet  Take 325 mg by mouth 2 (two) times daily with a meal.     HUMALOG KWIKPEN 100 UNIT/ML KiwkPen  Generic drug:  insulin lispro  Give sliding scale  Insulin before meals as follows 175-300=5 units, 301-400=8 units, 401-500=10 units, call MD if greater than 500     insulin glargine 100 UNIT/ML injection  Commonly known as:  LANTUS  Inject 22 Units into the skin at bedtime.     levETIRAcetam 250 MG tablet  Commonly known as:  KEPPRA  Take 250 mg by mouth daily.     levETIRAcetam 750 MG tablet  Commonly known as:  KEPPRA  Take 750 mg by mouth daily.     MULTIVITAMIN ADULT PO  Give 1 tablet by mouth  daily     pantoprazole sodium 40 mg/20 mL Pack  Commonly known as:  PROTONIX  Place 20 mLs (40 mg total) into feeding tube daily.     sodium bicarbonate 650 MG tablet  Take 650 mg by mouth 2 (two) times daily.      Of note Coumadin has been discontinued Review of Systems   Obtain from patient and nursing patient is a poor historian.  In general no complaints fever or chills.  Skin no complaints or rashes or itching.    Resp-denies any shortness of breath or cough.  Cardiac no chest pain.  GI does not complain of abdominal discomfort nausea vomiting diarrhea constipation.  GU- Has been treated for UTIs in past is not currently complaining of any dysuria  Musculoskeletal does not complain of joint pain  Neurologic is not complaining of dizziness headache does have a history seizures but this appears to have stabilized somewhat.  Psych does  have a history of some dementia confusion and recent increased agitation        Immunization History  Administered Date(s) Administered  . Influenza-Unspecified 11/29/2012, 12/15/2013, 11/16/2014  . PPD Test 01/21/2012       Pertinent  Health Maintenance Due  Topic Date Due  . HEMOGLOBIN A1C  08/09/2015  . FOOT EXAM  07/17/2016 (Originally 08/04/1950)  . OPHTHALMOLOGY EXAM  07/17/2016 (Originally 08/04/1950)  . URINE MICROALBUMIN  07/17/2016 (Originally 08/04/1950)  . COLONOSCOPY  07/17/2016 (Originally 08/04/1991)  . PNA vac Low Risk Adult (1 of 2 - PCV13) 07/17/2016 (Originally 08/04/2006)  . INFLUENZA VACCINE  09/17/2015   No flowsheet data found. Functional Status Survey:                          Is afebrile pulse of 56 respirations 18 blood pressure 128/72. Physical Exam   In general this is a pleasant elderly male in no distress.  His skin is warm and dry.  Eyes  Sclera and conjunctiva are clear visual acuity appears grossly intact pupils are reactive to light  Oropharynx clear mucous membranes moist.  Chest is clear to auscultation there is no labored breathing.  Heart is regular rate and rhythm slightly bradycardic at 56 on exam he does not have significant lower extremity edema.  Abdomen soft nontender positive bowel sounds.  Musculoskeletal he is able to move all extremities 4 ambulates in a wheelchair do not really appreciate significant lateralizing findings.  Neurologic as noted above cranial appear grossly intact   Psych he is oriented to self does follow simple verbal commands is pleasant smiling which has been his baseline   Labs.  08/30/2015.  Sodium 137 potassium 5.7 BUN 38 creatinine 2.2  06/26/2015.  WBC 3.3 hemoglobin 11.9 platelets 167.  Sodium 138 potassium 4.9 BUN 34 creatinine 2.25.    February   second 2017.  Sodium 139 potassium 4.5 BUN 26 creatinine 2.2.  TSH-1.14.  WBC 4.4 hemoglobin 12.1  platelets 188   Recent Labs (within last 365 days)   Recent Labs  03/21/15  AST 20  ALT 17  ALKPHOS 101      Recent Labs (within last 365 days)   Recent Labs  03/21/15  WBC 4.4  HGB 12.1*  HCT 38*  PLT 188     Recent Labs       Lab Results  Component Value Date   TSH 1.14 03/21/2015     Recent Labs  No results found for: HGBA1C  Recent Labs    Assessment and plan.  #1 hyperkalemia with history of renal insufficiency-he does continue on sodium bicarbonate-creatinine of 2.6 to appears near his baseline Will give a dose of Kayexalate 15 g for 1 dose and check a BMP later this week for follow-up clinically appears to be doing well.  #2 history of bradycardia this is intermittent asymptomatic he is on Coreg has an order to hold Coreg for pulse less than 60 at this point will monitor.  Hypertension-continues on Norvasc 2.5 mg a day as well as Coreg 12.5 twice a day this appears stable as noted above.  [id number]

## 2015-09-03 LAB — BASIC METABOLIC PANEL
BUN: 36 mg/dL — AB (ref 4–21)
CREATININE: 2.4 mg/dL — AB (ref 0.6–1.3)
GLUCOSE: 133 mg/dL
POTASSIUM: 4.5 mmol/L (ref 3.4–5.3)
SODIUM: 141 mmol/L (ref 137–147)

## 2015-09-25 ENCOUNTER — Encounter: Payer: Self-pay | Admitting: Internal Medicine

## 2015-09-25 ENCOUNTER — Non-Acute Institutional Stay (SKILLED_NURSING_FACILITY): Payer: Medicare Other | Admitting: Internal Medicine

## 2015-09-25 DIAGNOSIS — K219 Gastro-esophageal reflux disease without esophagitis: Secondary | ICD-10-CM | POA: Diagnosis not present

## 2015-09-25 DIAGNOSIS — I150 Renovascular hypertension: Secondary | ICD-10-CM

## 2015-09-25 DIAGNOSIS — R569 Unspecified convulsions: Secondary | ICD-10-CM

## 2015-09-25 NOTE — Progress Notes (Signed)
MRN: [id number] Name: Noah Torres  Sex: male Age: 75 y.o. DOB: 05-16-1940  [place] #:  Facility/Room: [facility] / [id number] Level Of Care: SNF Provider: Noah Delaine. Sheppard Coil, MD Emergency Contacts: Extended Emergency Contact Information Primary Emergency Contact: [place],  [place] Home Phone: [phone] Relation: None Secondary Emergency Contact: Jones,[name] Address: [address]          [place], [place] [address] [name] of [place] Phone: [phone] Relation: Other  Code Status: Full Code  Allergies: Metformin and related  Chief Complaint  Patient presents with  . Medical Management of Chronic Issues    Routine Visit    HPI: Patient is 75 y.o. male who is being seen for routine issues of HTN, GERD and seizures.  Past Medical History:  Diagnosis Date  . Bilateral hearing loss    Wears hearing aids  . Chronic kidney disease   . Diabetes mellitus, type 2 ([place])   . Former smoker   . Hearing loss    uses hearing aids  . Hypercholesterolemia   . Seizures ([place]) 09/13/2013    Past Surgical History:  Procedure Laterality Date  . SPINE SURGERY        Medication List       Accurate as of 09/25/15 11:59 PM. Always use your most recent med list.          amLODipine 2.5 MG tablet Commonly known as:  NORVASC Take 2.5 mg by mouth daily.   carvedilol 12.5 MG tablet Commonly known as:  COREG Take 12.5 mg by mouth 2 (two) times daily with a meal. Hold for pulse less than 60   ELIQUIS 2.5 MG Tabs tablet Generic drug:  apixaban Take 2.5 mg by mouth 2 (two) times daily.   escitalopram 10 MG tablet Commonly known as:  LEXAPRO Take 10 mg by mouth daily. For depression   ferrous sulfate 325 (65 FE) MG tablet Take 325 mg by mouth 2 (two) times daily with a meal.   HUMALOG KWIKPEN 100 UNIT/ML KiwkPen Generic drug:  insulin lispro Give sliding scale  Insulin before meals as follows 175-300=5 units, 301-400=8 units, 401-500=10 units, call MD if  greater than 500   insulin glargine 100 UNIT/ML injection Commonly known as:  LANTUS Inject 17 Units into the skin at bedtime.   levETIRAcetam 250 MG tablet Commonly known as:  KEPPRA Take 250 mg by mouth daily.   levETIRAcetam 750 MG tablet Commonly known as:  KEPPRA Take 750 mg by mouth daily.   MULTIVITAMIN ADULT PO Give 1 tablet by mouth daily   pantoprazole 40 MG tablet Commonly known as:  PROTONIX Take 40 mg by mouth daily.   sodium bicarbonate 650 MG tablet Take 650 mg by mouth 2 (two) times daily.       Meds ordered this encounter  Medications  . pantoprazole (PROTONIX) 40 MG tablet    Sig: Take 40 mg by mouth daily.  . carvedilol (COREG) 12.5 MG tablet    Sig: Take 12.5 mg by mouth 2 (two) times daily with a meal. Hold for pulse less than 60    Immunization History  Administered Date(s) Administered  . Influenza-Unspecified 11/29/2012, 12/15/2013, 11/16/2014  . PPD Test 01/21/2012    Social History  Substance Use Topics  . Smoking status: Former Research scientist (life sciences)  . Smokeless tobacco: Not on file  . Alcohol use No    Review of Systems  UTO 2/2 demnenmin tia  Vitals:   09/25/15 1406  BP: (!) 143/76  Pulse: 62  Resp: 18  Temp: 97.1 F (36.2 C)    Physical Exam  GENERAL APPEARANCE: Alert, min conversant, No acute distress  SKIN: No diaphoresis rash HEENT: Unremarkable RESPIRATORY: Breathing is even, unlabored. Lung sounds are clear   CARDIOVASCULAR: Heart RRR no murmurs, rubs or gallops. No peripheral edema  GASTROINTESTINAL: Abdomen is soft, non-tender, not distended w/ normal bowel sounds.  GENITOURINARY: Bladder non tender, not distended  MUSCULOSKELETAL: No abnormal joints or musculature NEUROLOGIC: Cranial nerves 2-12 grossly intact. Moves all extremities PSYCHIATRIC: Mood and affect appropriate to situation with dementia, no behavioral issues  Patient Active Problem List   Diagnosis Date Noted  . Conjunctivitis 07/18/2015  . CKD (chronic  kidney disease) stage 4, GFR 15-29 ml/min (HCC) 08/10/2014  . Metabolic acidosis [id number]  . Hypernatremia 08/10/2014  . Hyperglycemia 08/03/2014  . HCAP (healthcare-associated pneumonia) 08/03/2014  . Occasional tremors 08/02/2014  . Type 2 diabetes with nephropathy ([place]) 01/02/2014  . Mass of bladder 12/11/2013  . Pain in joint, lower leg 11/12/2013  . DVT (deep venous thrombosis) ([place]) 11/09/2013  . Seizures ([place]) 11/09/2013  . Altered mental status 10/16/2013  . Weakness 10/16/2013  . New onset seizure ([place]) 09/13/2013  . Dizziness 07/19/2013  . Fever presenting with conditions classified elsewhere 04/05/2013  . Cough 04/05/2013  . Type II or unspecified type diabetes mellitus with renal manifestations, uncontrolled 04/01/2013  . Thrush 03/01/2013  . Anemia, iron deficiency 09/22/2012  . Benign renovascular hypertension 07/26/2012  . Diabetes mellitus type 2 with complications ([place]) 0000000  . GERD (gastroesophageal reflux disease) 07/04/2012  . Chronic anticoagulation 07/04/2012  . Anxiety state, unspecified 07/04/2012  . Essential hypertension, benign 07/04/2012  . Type II or unspecified type diabetes mellitus without mention of complication, not stated as uncontrolled 07/04/2012  . Esophageal reflux 07/04/2012  . History of pulmonary embolism 07/04/2012  . Long term current use of anticoagulant therapy 05/09/2012  . HTN (hypertension) 01/21/2012  . DM type 2 (diabetes mellitus, type 2) ([place]) 01/21/2012  . Urinary retention 01/21/2012  . Encephalopathy 01/09/2012  . Normal pressure hydrocephalus 01/09/2012  . Acute respiratory failure ([place]) 01/04/2012  . PE (pulmonary thromboembolism) ([place]) 01/04/2012  . Cardiac arrest ([place]) 01/04/2012  . Acute renal failure ([place]) 01/04/2012    CBC    Component Value Date/Time   WBC 4.0 08/28/2015   WBC 5.1 01/21/2012 0720   RBC 3.72 (L) 01/21/2012 0720   HGB 11.9 (A) 08/28/2015   HCT 37 (A) 08/28/2015   PLT 159 08/28/2015    MCV 81.5 01/21/2012 0720    CMP     Component Value Date/Time   NA 141 09/03/2015   K 4.5 09/03/2015   CL 108 01/21/2012 0720   CO2 18 (L) 01/21/2012 0720   GLUCOSE 185 (H) 01/21/2012 0720   BUN 36 (A) 09/03/2015   CREATININE 2.4 (A) 09/03/2015   CREATININE 4.16 (H) 01/21/2012 0720   CALCIUM 8.5 01/21/2012 0720   PROT 7.7 01/15/2012 0440   ALBUMIN 2.1 (L) 01/21/2012 0720   AST 22 07/16/2015   ALT 20 07/16/2015   ALKPHOS 145 (A) 07/16/2015   BILITOT 0.4 01/15/2012 0440   GFRNONAA 13 (L) 01/21/2012 0720   GFRAA 15 (L) 01/21/2012 0720    Assessment and Plan  Benign renovascular hypertension Chronic; on coreg 12.5 mg BID and norvasc 2.5 mg daily; today's reading is a little high but prior BP was OK;will monitor  GERD (gastroesophageal reflux disease)  No signs or sx of reflux reported; cont protonix 40 mg daily  Seizures No reported recent seizures; continue Keppra 500 mg BID   Noah Delaine. Sheppard Coil, MD

## 2015-10-06 NOTE — Assessment & Plan Note (Signed)
No reported recent seizures; continue Keppra 500 mg BID

## 2015-10-06 NOTE — Assessment & Plan Note (Signed)
No signs or sx of reflux reported; cont protonix 40 mg daily

## 2015-10-06 NOTE — Assessment & Plan Note (Signed)
Chronic; on coreg 12.5 mg BID and norvasc 2.5 mg daily; today's reading is a little high but prior BP was OK;will monitor

## 2015-10-09 ENCOUNTER — Non-Acute Institutional Stay (SKILLED_NURSING_FACILITY): Payer: Medicare Other | Admitting: Internal Medicine

## 2015-10-09 ENCOUNTER — Encounter: Payer: Self-pay | Admitting: Internal Medicine

## 2015-10-09 DIAGNOSIS — E1121 Type 2 diabetes mellitus with diabetic nephropathy: Secondary | ICD-10-CM | POA: Diagnosis not present

## 2015-10-09 NOTE — Progress Notes (Signed)
MRN: [id number] Name: Noah Torres  Sex: male Age: 75 y.o. DOB: March 20, 1940  [place] #:  Facility/Room:[facility] / [id number] Level Of Care: SNF Provider: Noah Delaine. Sheppard Coil, MD Emergency Contacts: Extended Emergency Contact Information Primary Emergency Contact: [place],  [place] Home Phone: [phone] Relation: None Secondary Emergency Contact: Jones,[name] Address: [address]          [place], [place] [address] [name] of [place] Phone: [phone] Relation: Other  Code Status: Full Code  Allergies: Metformin and related  Chief Complaint  Patient presents with  . Acute Visit    Acute    HPI: Patient is 75 y.o. male who nursing asked me to see for repeatedly low BS in am. Pt with dementia, he can not really tell me anything about it.  Past Medical History:  Diagnosis Date  . Bilateral hearing loss    Wears hearing aids  . Chronic kidney disease   . Diabetes mellitus, type 2 ([place])   . Former smoker   . Hearing loss    uses hearing aids  . Hypercholesterolemia   . Seizures ([place]) 09/13/2013    Past Surgical History:  Procedure Laterality Date  . SPINE SURGERY        Medication List       Accurate as of 10/09/15 11:56 AM. Always use your most recent med list.          amLODipine 2.5 MG tablet Commonly known as:  NORVASC Take 2.5 mg by mouth daily.   carvedilol 12.5 MG tablet Commonly known as:  COREG Take 12.5 mg by mouth 2 (two) times daily with a meal. Hold for pulse less than 60   ELIQUIS 2.5 MG Tabs tablet Generic drug:  apixaban Take 2.5 mg by mouth 2 (two) times daily.   escitalopram 10 MG tablet Commonly known as:  LEXAPRO Take 10 mg by mouth daily. For depression   ferrous sulfate 325 (65 FE) MG tablet Take 325 mg by mouth 2 (two) times daily with a meal.   gentamicin 0.3 % ophthalmic solution Commonly known as:  GARAMYCIN Place 1 drop into both eyes 4 (four) times daily.   HUMALOG KWIKPEN 100 UNIT/ML  KiwkPen Generic drug:  insulin lispro Give sliding scale  Insulin before meals as follows 175-300=5 units, 301-400=8 units, 401-500=10 units, call MD if greater than 500   insulin glargine 100 UNIT/ML injection Commonly known as:  LANTUS Inject 17 Units into the skin at bedtime.   levETIRAcetam 250 MG tablet Commonly known as:  KEPPRA Take 250 mg by mouth daily.   levETIRAcetam 750 MG tablet Commonly known as:  KEPPRA Take 750 mg by mouth daily.   MULTIVITAMIN ADULT PO Give 1 tablet by mouth daily   pantoprazole 40 MG tablet Commonly known as:  PROTONIX Take 40 mg by mouth daily.   sodium bicarbonate 650 MG tablet Take 650 mg by mouth 2 (two) times daily.       Meds ordered this encounter  Medications  . gentamicin (GARAMYCIN) 0.3 % ophthalmic solution    Sig: Place 1 drop into both eyes 4 (four) times daily.    Immunization History  Administered Date(s) Administered  . Influenza-Unspecified 11/29/2012, 12/15/2013, 11/16/2014  . PPD Test 01/21/2012    Social History  Substance Use Topics  . Smoking status: Former Research scientist (life sciences)  . Smokeless tobacco: Not on file  . Alcohol use No    Review of Systems  UTO 2/2  dementia    Vitals:   10/09/15 1114  BP: 124/76  Pulse: (!) 52  Resp: 18  Temp: 97.7 F (36.5 C)    Physical Exam  GENERAL APPEARANCE: Alert, min conversant, No acute distress  SKIN: No diaphoresis rash HEENT: Unremarkable RESPIRATORY: Breathing is even, unlabored. Lung sounds are clear   CARDIOVASCULAR: Heart RRR no murmurs, rubs or gallops. No peripheral edema  GASTROINTESTINAL: Abdomen is soft, non-tender, not distended w/ normal bowel sounds.  GENITOURINARY: Bladder non tender, not distended  MUSCULOSKELETAL: No abnormal joints or musculature NEUROLOGIC: Cranial nerves 2-12 grossly intact. Moves all extremities PSYCHIATRIC: dementia, no behavioral issues  Patient Active Problem List   Diagnosis Date Noted  . Conjunctivitis 07/18/2015  . CKD  (chronic kidney disease) stage 4, GFR 15-29 ml/min (HCC) 08/10/2014  . Metabolic acidosis [id number]  . Hypernatremia 08/10/2014  . Hyperglycemia 08/03/2014  . HCAP (healthcare-associated pneumonia) 08/03/2014  . Occasional tremors 08/02/2014  . Type 2 diabetes with nephropathy ([place]) 01/02/2014  . Mass of bladder 12/11/2013  . Pain in joint, lower leg 11/12/2013  . DVT (deep venous thrombosis) ([place]) 11/09/2013  . Seizures ([place]) 11/09/2013  . Altered mental status 10/16/2013  . Weakness 10/16/2013  . New onset seizure ([place]) 09/13/2013  . Dizziness 07/19/2013  . Fever presenting with conditions classified elsewhere 04/05/2013  . Cough 04/05/2013  . Type II or unspecified type diabetes mellitus with renal manifestations, uncontrolled 04/01/2013  . Thrush 03/01/2013  . Anemia, iron deficiency 09/22/2012  . Benign renovascular hypertension 07/26/2012  . Diabetes mellitus type 2 with complications ([place]) 0000000  . GERD (gastroesophageal reflux disease) 07/04/2012  . Chronic anticoagulation 07/04/2012  . Anxiety state, unspecified 07/04/2012  . Essential hypertension, benign 07/04/2012  . Type II or unspecified type diabetes mellitus without mention of complication, not stated as uncontrolled 07/04/2012  . Esophageal reflux 07/04/2012  . History of pulmonary embolism 07/04/2012  . Long term current use of anticoagulant therapy 05/09/2012  . HTN (hypertension) 01/21/2012  . DM type 2 (diabetes mellitus, type 2) ([place]) 01/21/2012  . Urinary retention 01/21/2012  . Encephalopathy 01/09/2012  . Normal pressure hydrocephalus 01/09/2012  . Acute respiratory failure ([place]) 01/04/2012  . PE (pulmonary thromboembolism) ([place]) 01/04/2012  . Cardiac arrest ([place]) 01/04/2012  . Acute renal failure ([place]) 01/04/2012    CBC    Component Value Date/Time   WBC 4.0 08/28/2015   WBC 5.1 01/21/2012 0720   RBC 3.72 (L) 01/21/2012 0720   HGB 11.9 (A) 08/28/2015   HCT 37 (A) 08/28/2015   PLT 159  08/28/2015   MCV 81.5 01/21/2012 0720    CMP     Component Value Date/Time   NA 141 09/03/2015   K 4.5 09/03/2015   CL 108 01/21/2012 0720   CO2 18 (L) 01/21/2012 0720   GLUCOSE 185 (H) 01/21/2012 0720   BUN 36 (A) 09/03/2015   CREATININE 2.4 (A) 09/03/2015   CREATININE 4.16 (H) 01/21/2012 0720   CALCIUM 8.5 01/21/2012 0720   PROT 7.7 01/15/2012 0440   ALBUMIN 2.1 (L) 01/21/2012 0720   AST 22 07/16/2015   ALT 20 07/16/2015   ALKPHOS 145 (A) 07/16/2015   BILITOT 0.4 01/15/2012 0440   GFRNONAA 13 (L) 01/21/2012 0720   GFRAA 15 (L) 01/21/2012 0720    Assessment and Plan  Type 2 diabetes with nephropathy 5/30 A1c was 9 and yet pt is now having am BS 26, 38, 40, 56, mostly below 100 on levemir 17 u at 9pm; pt does get pm  snack and eats > 50%; the rest of the day his BS are below 200, usually below 150 because he rarely get his SSI; Pt's Cr is 2.35, CrCl is 27.3. Plan to change levemir to toujeo and give toujeo 12u in am after breakfast. Will look at his BS next week and consider adding januvia 25 mg in am.  Time spent > 25 min;> 50% of time with patient was spent reviewing records, labs, tests and studies, counseling and developing plan of care  Webb Silversmith D. Sheppard Coil, MD

## 2015-10-09 NOTE — Assessment & Plan Note (Signed)
5/30 A1c was 9 and yet pt is now having am BS 26, 38, 40, 56, mostly below 100 on levemir 17 u at 9pm; pt does get pm snack and eats > 50%; the rest of the day his BS are below 200, usually below 150 because he rarely get his SSI; Pt's Cr is 2.35, CrCl is 27.3. Plan to change levemir to toujeo and give toujeo 12u in am after breakfast. Will look at his BS next week and consider adding januvia 25 mg in am.

## 2015-10-15 ENCOUNTER — Encounter: Payer: Self-pay | Admitting: Internal Medicine

## 2015-10-15 ENCOUNTER — Non-Acute Institutional Stay (SKILLED_NURSING_FACILITY): Payer: Medicare Other | Admitting: Internal Medicine

## 2015-10-15 DIAGNOSIS — N390 Urinary tract infection, site not specified: Secondary | ICD-10-CM

## 2015-10-15 DIAGNOSIS — E1165 Type 2 diabetes mellitus with hyperglycemia: Secondary | ICD-10-CM | POA: Diagnosis not present

## 2015-10-15 DIAGNOSIS — N183 Chronic kidney disease, stage 3 (moderate): Secondary | ICD-10-CM

## 2015-10-15 DIAGNOSIS — IMO0002 Reserved for concepts with insufficient information to code with codable children: Secondary | ICD-10-CM | POA: Insufficient documentation

## 2015-10-15 DIAGNOSIS — Z794 Long term (current) use of insulin: Secondary | ICD-10-CM | POA: Diagnosis not present

## 2015-10-15 DIAGNOSIS — E1122 Type 2 diabetes mellitus with diabetic chronic kidney disease: Secondary | ICD-10-CM

## 2015-10-15 DIAGNOSIS — C679 Malignant neoplasm of bladder, unspecified: Secondary | ICD-10-CM | POA: Diagnosis not present

## 2015-10-15 NOTE — Progress Notes (Signed)
MRN: [id number] Name: Noah Torres  Sex: male Age: 75 y.o. DOB: Apr 24, 1940  [place] #:  Facility/Room: [facility] / [id number] D Level Of Care: SNF Provider: Noah Delaine. Sheppard Coil, MD Emergency Contacts: Extended Emergency Contact Information Primary Emergency Contact: [place],  [place] Home Phone: [phone] Relation: None Secondary Emergency Contact: Jones,[name] Address: [address]          [place], [place] [address] [name] of [place] Phone: [phone] Relation: Other  Code Status: Full Code  Allergies: Metformin and related  Chief Complaint  Patient presents with  . Acute Visit    Acute    HPI: Patient is 75 y.o. male who is being seen for f/u of BS. Pt's 17 u of lantus q HS, with am BS in the 40's was changed to 12 u of toujeo in the am. Am BS now are in the 70's and higher. The day shift nurse assures me pt's higher BS are that way because pt is fed before the BS is reported.The BS through the rest of the day are mostly < 200 and nursing says she can't remember ever using more than 5 mg with meals from the [place]. Pt is also being seen for a UTI from a U/A accidentally done. Pt has a hx of recurrent UTI's. Pt has bladder CA.  Past Medical History:  Diagnosis Date  . Bilateral hearing loss    Wears hearing aids  . Chronic kidney disease   . Diabetes mellitus, type 2 ([place])   . Former smoker   . Hearing loss    uses hearing aids  . Hypercholesterolemia   . Seizures ([place]) 09/13/2013    Past Surgical History:  Procedure Laterality Date  . SPINE SURGERY        Medication List       Accurate as of 10/15/15  7:17 PM. Always use your most recent med list.          amLODipine 2.5 MG tablet Commonly known as:  NORVASC Take 2.5 mg by mouth daily.   carvedilol 12.5 MG tablet Commonly known as:  COREG Take 12.5 mg by mouth 2 (two) times daily with a meal. Hold for pulse less than 60   ELIQUIS 2.5 MG Tabs tablet Generic drug:  apixaban Take 2.5  mg by mouth 2 (two) times daily.   escitalopram 10 MG tablet Commonly known as:  LEXAPRO Take 10 mg by mouth daily. For depression   ferrous sulfate 325 (65 FE) MG tablet Take 325 mg by mouth 2 (two) times daily with a meal.   HUMALOG KWIKPEN 100 UNIT/ML KiwkPen Generic drug:  insulin lispro Give sliding scale  Insulin before meals as follows 175-300=5 units, 301-400=8 units, 401-500=10 units, call MD if greater than 500   insulin glargine 100 UNIT/ML injection Commonly known as:  LANTUS Inject 17 Units into the skin at bedtime.   levETIRAcetam 250 MG tablet Commonly known as:  KEPPRA Take 250 mg by mouth daily.   levETIRAcetam 750 MG tablet Commonly known as:  KEPPRA Take 750 mg by mouth daily.   MULTIVITAMIN ADULT PO Give 1 tablet by mouth daily   pantoprazole 40 MG tablet Commonly known as:  PROTONIX Take 40 mg by mouth daily.   sodium bicarbonate 650 MG tablet Take 650 mg by mouth 2 (two) times daily.       No orders of the defined types were placed in this encounter.   Immunization History  Administered Date(s) Administered  . Influenza-Unspecified 11/29/2012, 12/15/2013, 11/16/2014  . PPD Test 01/21/2012    Social History  Substance Use Topics  . Smoking status: Former Research scientist (life sciences)  . Smokeless tobacco: Not on file  . Alcohol use No    Review of Systems  UTO 2/2 dementia     Vitals:   10/15/15 1534  BP: 127/72  Pulse: (!) 52  Resp: 18  Temp: 97.3 F (36.3 C)    Physical Exam  GENERAL APPEARANCE: Alert, min conversant, No acute distress  SKIN: No diaphoresis rash HEENT: Unremarkable RESPIRATORY: Breathing is even, unlabored. Lung sounds are clear   CARDIOVASCULAR: Heart RRR no murmurs, rubs or gallops. No peripheral edema  GASTROINTESTINAL: Abdomen is soft, non-tender, not distended w/ normal bowel sounds.  GENITOURINARY: Bladder non tender, not distended  MUSCULOSKELETAL: No abnormal joints or musculature NEUROLOGIC: Cranial nerves 2-12  grossly intact. Moves all extremities PSYCHIATRIC: Mood and affect appropriate to situation with dementia, no behavioral issues  Patient Active Problem List   Diagnosis Date Noted  . Conjunctivitis 07/18/2015  . CKD (chronic kidney disease) stage 4, GFR 15-29 ml/min (HCC) 08/10/2014  . Metabolic acidosis [id number]  . Hypernatremia 08/10/2014  . Hyperglycemia 08/03/2014  . HCAP (healthcare-associated pneumonia) 08/03/2014  . Occasional tremors 08/02/2014  . Type 2 diabetes with nephropathy ([place]) 01/02/2014  . Mass of bladder 12/11/2013  . Pain in joint, lower leg 11/12/2013  . DVT (deep venous thrombosis) ([place]) 11/09/2013  . Seizures ([place]) 11/09/2013  . Altered mental status 10/16/2013  . Weakness 10/16/2013  . New onset seizure ([place]) 09/13/2013  . Dizziness 07/19/2013  . Fever presenting with conditions classified elsewhere 04/05/2013  . Cough 04/05/2013  . Type II or unspecified type diabetes mellitus with renal manifestations, uncontrolled 04/01/2013  . Thrush 03/01/2013  . Anemia, iron deficiency 09/22/2012  . Benign renovascular hypertension 07/26/2012  . Diabetes mellitus type 2 with complications ([place]) 0000000  . GERD (gastroesophageal reflux disease) 07/04/2012  . Chronic anticoagulation 07/04/2012  . Anxiety state, unspecified 07/04/2012  . Essential hypertension, benign 07/04/2012  . Type II or unspecified type diabetes mellitus without mention of complication, not stated as uncontrolled 07/04/2012  . Esophageal reflux 07/04/2012  . History of pulmonary embolism 07/04/2012  . Long term current use of anticoagulant therapy 05/09/2012  . HTN (hypertension) 01/21/2012  . DM type 2 (diabetes mellitus, type 2) ([place]) 01/21/2012  . Urinary retention 01/21/2012  . Encephalopathy 01/09/2012  . Normal pressure hydrocephalus 01/09/2012  . Acute respiratory failure ([place]) 01/04/2012  . PE (pulmonary thromboembolism) ([place]) 01/04/2012  . Cardiac arrest ([place]) 01/04/2012  .  Acute renal failure ([place]) 01/04/2012    CBC    Component Value Date/Time   WBC 4.0 08/28/2015   WBC 5.1 01/21/2012 0720   RBC 3.72 (L) 01/21/2012 0720   HGB 11.9 (A) 08/28/2015   HCT 37 (A) 08/28/2015   PLT 159 08/28/2015   MCV 81.5 01/21/2012 0720    CMP     Component Value Date/Time   NA 141 09/03/2015   K 4.5 09/03/2015   CL 108 01/21/2012 0720   CO2 18 (L) 01/21/2012 0720   GLUCOSE 185 (H) 01/21/2012 0720   BUN 36 (A) 09/03/2015   CREATININE 2.4 (A) 09/03/2015   CREATININE 4.16 (H) 01/21/2012 0720   CALCIUM 8.5 01/21/2012 0720   PROT 7.7 01/15/2012 0440   ALBUMIN 2.1 (L) 01/21/2012 0720   AST 22 07/16/2015   ALT 20 07/16/2015   ALKPHOS 145 (A) 07/16/2015   BILITOT  0.4 01/15/2012 0440   GFRNONAA 13 (L) 01/21/2012 0720   GFRAA 15 (L) 01/21/2012 0720    Assessment and Plan  DM2, UNCONTROLLED - Pt's BS for lunch and dinner on 17u of lantus looked good, but am BS very low, like 12-15 hours of coverage would be perfect. Therefore am going to try NPH 17 u q am after breakfast and 5u humalog for BS >175 with meals; will monitor  RECURRENT UTI/BLADDER CA- Pt grew ou > 100,000 proteus mirabalis sensitive to most; using Rocephin 1 gm IM daily for 7 days. I also started him on cranberry 450 mg tabs BID as prophylaxis. I called RP, no answer, left a message to call back today or Thursday.   Time spent > 35 min;> 50% of time with patient was spent reviewing records, labs, tests and studies, counseling and developing plan of care  Noah Delaine. Sheppard Coil, MD

## 2015-10-17 ENCOUNTER — Non-Acute Institutional Stay (SKILLED_NURSING_FACILITY): Payer: Medicare Other | Admitting: Internal Medicine

## 2015-10-17 ENCOUNTER — Encounter: Payer: Self-pay | Admitting: Internal Medicine

## 2015-10-17 DIAGNOSIS — C679 Malignant neoplasm of bladder, unspecified: Secondary | ICD-10-CM

## 2015-10-17 DIAGNOSIS — N3000 Acute cystitis without hematuria: Secondary | ICD-10-CM | POA: Diagnosis not present

## 2015-10-17 DIAGNOSIS — R627 Adult failure to thrive: Secondary | ICD-10-CM | POA: Diagnosis not present

## 2015-10-17 NOTE — Progress Notes (Signed)
MRN: [id number] Name: Noah Torres  Sex: male Age: 75 y.o. DOB: 13-Jun-1940  [place] #:  [facility]Room[facility] / [id number] D Level Of Care: SNF Provider: Noah Delaine. Sheppard Coil, MD Emergency Contacts: Extended Emergency Contact Information Primary Emergency Contact: [place],  [place] Home Phone: [phone] Relation: None Secondary Emergency Contact: Jones,[name] Address: [address]          [place], [place] [address] [name] of [place] Phone: [phone] Relation: Other  Code Status: Full Code  Allergies: Metformin and related  Chief Complaint  Patient presents with  . Acute Visit    Acute    HPI: Patient is 75 y.o. male with bladder CA who nursing asked me to see for a status change. Pt has been very slowly declining over months and per pt's very observant roommate pt's decline hastened during the last 2 weeks and has sped up even more last 2 days. Pt is not eating or drinking much, appears at times to have difficulty swallowing. Pt was started on Rocephin  8/25 for a UTI. He has been seen recently for low BS in the am and insulin regimens.  Past Medical History:  Diagnosis Date  . Bilateral hearing loss    Wears hearing aids  . Chronic kidney disease   . Diabetes mellitus, type 2 ([place])   . Former smoker   . Hearing loss    uses hearing aids  . Hypercholesterolemia   . Seizures ([place]) 09/13/2013    Past Surgical History:  Procedure Laterality Date  . SPINE SURGERY        Medication List       Accurate as of 10/17/15 11:59 PM. Always use your most recent med list.          amLODipine 2.5 MG tablet Commonly known as:  NORVASC Take 2.5 mg by mouth daily.   carvedilol 12.5 MG tablet Commonly known as:  COREG Take 12.5 mg by mouth 2 (two) times daily with a meal. Hold for pulse less than 60   ELIQUIS 2.5 MG Tabs tablet Generic drug:  apixaban Take 2.5 mg by mouth 2 (two) times daily.   escitalopram 10 MG tablet Commonly known as:   LEXAPRO Take 10 mg by mouth daily. For depression   ferrous sulfate 325 (65 FE) MG tablet Take 325 mg by mouth 2 (two) times daily with a meal.   HUMALOG KWIKPEN 100 UNIT/ML KiwkPen Generic drug:  insulin lispro Inject 5 units with meals for CBG > 175  sliding scale  Insulin before meals as follows 175-300=5 units, 301-400=8 units, 401-500=10 units, call MD if greater than 500   insulin glargine 100 UNIT/ML injection Commonly known as:  LANTUS Inject 17 Units into the skin at bedtime.   levETIRAcetam 250 MG tablet Commonly known as:  KEPPRA Take 250 mg by mouth daily.   levETIRAcetam 750 MG tablet Commonly known as:  KEPPRA Take 750 mg by mouth daily.   MULTIVITAMIN ADULT PO Give 1 tablet by mouth daily   pantoprazole 40 MG tablet Commonly known as:  PROTONIX Take 40 mg by mouth daily.   sodium bicarbonate 650 MG tablet Take 650 mg by mouth 2 (two) times daily.       No orders of the defined types were placed in this encounter.   Immunization History  Administered Date(s) Administered  . Influenza-Unspecified 11/29/2012, 12/15/2013, 11/16/2014  . PPD Test 01/21/2012    Social History  Substance Use  Topics  . Smoking status: Former Research scientist (life sciences)  . Smokeless tobacco: Not on file  . Alcohol use No    Review of Systems  UTO 2/2 condition ; per nurse, roommate as per HPI    Vitals:   10/17/15 1337  BP: (!) 110/59  Pulse: 62  Resp: 18  Temp: 97.5 F (36.4 C)    Physical Exam  GENERAL APPEARANCE: somnalent , No acute distress  SKIN: No diaphoresis rash HEENT: Unremarkable RESPIRATORY: Breathing is even, unlabored. Lung sounds are clear   CARDIOVASCULAR: Heart RRR no murmurs, rubs or gallops. No peripheral edema  GASTROINTESTINAL: Abdomen is soft, non-tender, not distended w/ normal bowel sounds.  GENITOURINARY: Bladder non tender, not distended  MUSCULOSKELETAL: No abnormal joints or musculature NEUROLOGIC: Cranial nerves 2-12 grossly intact. Moves all  extremities; what little pt says is mumbed and I cant decifer it, this is a little worse than baseline; be opens eys to voice PSYCHIATRIC: dementia, no behavioral issues  Patient Active Problem List   Diagnosis Date Noted  . DM (diabetes mellitus), type 2, uncontrolled ([place]) 10/15/2015  . Recurrent UTI 10/15/2015  . Bladder cancer ([place]) 10/15/2015  . Conjunctivitis 07/18/2015  . CKD (chronic kidney disease) stage 4, GFR 15-29 ml/min (HCC) 08/10/2014  . Metabolic acidosis [id number]  . Hypernatremia 08/10/2014  . Hyperglycemia 08/03/2014  . HCAP (healthcare-associated pneumonia) 08/03/2014  . Occasional tremors 08/02/2014  . Type 2 diabetes with nephropathy ([place]) 01/02/2014  . Mass of bladder 12/11/2013  . Pain in joint, lower leg 11/12/2013  . DVT (deep venous thrombosis) ([place]) 11/09/2013  . Seizures ([place]) 11/09/2013  . Altered mental status 10/16/2013  . Weakness 10/16/2013  . New onset seizure ([place]) 09/13/2013  . Dizziness 07/19/2013  . Fever presenting with conditions classified elsewhere 04/05/2013  . Cough 04/05/2013  . Type II or unspecified type diabetes mellitus with renal manifestations, uncontrolled 04/01/2013  . Thrush 03/01/2013  . Anemia, iron deficiency 09/22/2012  . Benign renovascular hypertension 07/26/2012  . Diabetes mellitus type 2 with complications ([place]) 0000000  . GERD (gastroesophageal reflux disease) 07/04/2012  . Chronic anticoagulation 07/04/2012  . Anxiety state, unspecified 07/04/2012  . Essential hypertension, benign 07/04/2012  . Type II or unspecified type diabetes mellitus without mention of complication, not stated as uncontrolled 07/04/2012  . Esophageal reflux 07/04/2012  . History of pulmonary embolism 07/04/2012  . Long term current use of anticoagulant therapy 05/09/2012  . HTN (hypertension) 01/21/2012  . DM type 2 (diabetes mellitus, type 2) ([place]) 01/21/2012  . Urinary retention 01/21/2012  . Encephalopathy 01/09/2012  . Normal  pressure hydrocephalus 01/09/2012  . Acute respiratory failure ([place]) 01/04/2012  . PE (pulmonary thromboembolism) ([place]) 01/04/2012  . Cardiac arrest ([place]) 01/04/2012  . Acute renal failure ([place]) 01/04/2012    CBC    Component Value Date/Time   WBC 4.0 08/28/2015   WBC 5.1 01/21/2012 0720   RBC 3.72 (L) 01/21/2012 0720   HGB 11.9 (A) 08/28/2015   HCT 37 (A) 08/28/2015   PLT 159 08/28/2015   MCV 81.5 01/21/2012 0720    CMP     Component Value Date/Time   NA 141 09/03/2015   K 4.5 09/03/2015   CL 108 01/21/2012 0720   CO2 18 (L) 01/21/2012 0720   GLUCOSE 185 (H) 01/21/2012 0720   BUN 36 (A) 09/03/2015   CREATININE 2.4 (A) 09/03/2015   CREATININE 4.16 (H) 01/21/2012 0720   CALCIUM 8.5 01/21/2012 0720   PROT 7.7 01/15/2012 0440   ALBUMIN 2.1 (L) 01/21/2012  0720   AST 22 07/16/2015   ALT 20 07/16/2015   ALKPHOS 145 (A) 07/16/2015   BILITOT 0.4 01/15/2012 0440   GFRNONAA 13 (L) 01/21/2012 0720   GFRAA 15 (L) 01/21/2012 0720    Assessment and Plan  FTT / BLADDER CA- I was shocked to see pt is a FULL code. I don't think this is the bladder infection, I think this is bladder CA. However will give D5NS at 100 cc hr for 2 liters , pt must be dehydrated. BS this am reported 64, insulin was held. I have called RP several days ago because I was told she had concerns about pt's UTI but she did not answer my message. I called her today today to discuss how she wanted [place] to proceed. Pt needs to be  DNR. RP  did not answer , left message.  UTI - plan to continue 7 days of Rocephin.   Time spent > 35 min;> 50% of time with patient was spent reviewing records, labs, tests and studies, counseling and developing plan of care  Noah Delaine. Sheppard Coil, MD

## 2015-10-18 ENCOUNTER — Non-Acute Institutional Stay (SKILLED_NURSING_FACILITY): Payer: Medicare Other | Admitting: Internal Medicine

## 2015-10-18 ENCOUNTER — Encounter: Payer: Self-pay | Admitting: Internal Medicine

## 2015-10-18 DIAGNOSIS — Z710 Person encountering health services to consult on behalf of another person: Secondary | ICD-10-CM | POA: Diagnosis not present

## 2015-10-18 DIAGNOSIS — N184 Chronic kidney disease, stage 4 (severe): Secondary | ICD-10-CM | POA: Diagnosis not present

## 2015-10-18 DIAGNOSIS — R627 Adult failure to thrive: Secondary | ICD-10-CM | POA: Diagnosis not present

## 2015-10-18 DIAGNOSIS — C679 Malignant neoplasm of bladder, unspecified: Secondary | ICD-10-CM | POA: Diagnosis not present

## 2015-10-18 DIAGNOSIS — E878 Other disorders of electrolyte and fluid balance, not elsewhere classified: Secondary | ICD-10-CM

## 2015-10-18 NOTE — Progress Notes (Addendum)
MRN: [id number] Name: Noah Torres  Sex: male Age: 75 y.o. DOB: [birth year]  [place] #:  [facility] / [id number] D Level Of Care: SNF Provider: Noah Delaine. Sheppard Coil, MD Emergency Contacts: Extended Emergency Contact Information Primary Emergency Contact: [name],  [name] Home Phone: [phone] Relation: None Secondary Emergency Contact: Jones,[name] Address: [address]          [place], [place] [address] [name] of [place] Phone: [phone] Relation: Other  Code Status: Full Code  Allergies: Metformin and related  Chief Complaint  Patient presents with  . Acute Visit    Acute    HPI: Patient is 75 y.o. male with bladder CA who has been in steep decline since past few days. Yesterday IVF were started on him with D5 NS. He is slightly more responsive today, he opened eyes to voice and spoke something but he mumbled so I can not understand him..Today is labs came back with abnormalities that need to be addressed. His work of breathing seems to have increased even though his lungs are clear with RA O2 sat OF 93%. Also pt is still not a DNR, his RP has not called back.That needs to be addressed .   Past Medical History:  Diagnosis Date  . Bilateral hearing loss    Wears hearing aids  . Chronic kidney disease   . Diabetes mellitus, type 2 ([place])   . Former smoker   . Hearing loss    uses hearing aids  . Hypercholesterolemia   . Seizures ([place]) 09/13/2013    Past Surgical History:  Procedure Laterality Date  . SPINE SURGERY        Medication List       Accurate as of 10/18/15 11:59 PM. Always use your most recent med list.          amLODipine 2.5 MG tablet Commonly known as:  NORVASC Take 2.5 mg by mouth daily.   carvedilol 12.5 MG tablet Commonly known as:  COREG Take 12.5 mg by mouth 2 (two) times daily with a meal. Hold for pulse less than 60   Cranberry 450 MG Tabs Take 1 tablet by mouth 2 (two) times daily.   ELIQUIS 2.5  MG Tabs tablet Generic drug:  apixaban Take 2.5 mg by mouth 2 (two) times daily.   escitalopram 10 MG tablet Commonly known as:  LEXAPRO Take 10 mg by mouth daily. For depression   ferrous sulfate 325 (65 FE) MG tablet Take 325 mg by mouth 2 (two) times daily with a meal.   HUMALOG KWIKPEN 100 UNIT/ML KiwkPen Generic drug:  insulin lispro Inject 5 units with meals for CBG > 175  sliding scale  Insulin before meals as follows 175-300=5 units, 301-400=8 units, 401-500=10 units, call MD if greater than 500   HUMULIN N KWIKPEN 100 UNIT/ML Kiwkpen Generic drug:  Insulin NPH (Human) (Isophane) Inject 17 Units into the skin every morning.   levETIRAcetam 250 MG tablet Commonly known as:  KEPPRA Take 250 mg by mouth daily.   levETIRAcetam 750 MG tablet Commonly known as:  KEPPRA Take 750 mg by mouth daily.   MULTIVITAMIN ADULT PO Give 1 tablet by mouth daily   pantoprazole 40 MG tablet Commonly known as:  PROTONIX Take 40 mg by mouth daily.   saccharomyces boulardii 250 MG capsule Commonly known as:  FLORASTOR Take 250 mg by mouth 2 (two) times daily. Stop Date 10/24/15   sodium bicarbonate 650  MG tablet Take 650 mg by mouth 2 (two) times daily.       Meds ordered this encounter  Medications  . Cranberry 450 MG TABS    Sig: Take 1 tablet by mouth 2 (two) times daily.  Marland Kitchen saccharomyces boulardii (FLORASTOR) 250 MG capsule    Sig: Take 250 mg by mouth 2 (two) times daily. Stop Date 10/24/15  . Insulin NPH, Human,, Isophane, (HUMULIN N KWIKPEN) 100 UNIT/ML Kiwkpen    Sig: Inject 17 Units into the skin every morning.    Immunization History  Administered Date(s) Administered  . Influenza-Unspecified 11/29/2012, 12/15/2013, 11/16/2014  . PPD Test 01/21/2012    Social History  Substance Use Topics  . Smoking status: Former Research scientist (life sciences)  . Smokeless tobacco: Not on file  . Alcohol use No    Review of Systems  DATA OBTAINED: from nurse as per HPI.  Vitals:   10/18/15 1128   BP: (!) 110/59  Pulse: 62  Resp: 18  Temp: 97.5 F (36.4 C)    Physical Exam  GENERAL APPEARANCE: somnalent  minimal distress  SKIN: No diaphoresis rash HEENT: Unremarkable RESPIRATORY: Breathing is even, slt labored. Lung sounds are slt rales at bases   CARDIOVASCULAR: Heart RRR no murmurs, rubs or gallops. No peripheral edema  GASTROINTESTINAL: Abdomen is soft, non-tender, not distended w/ normal bowel sounds.  GENITOURINARY: Bladder non tender, not distended  MUSCULOSKELETAL: No abnormal joints or musculature NEUROLOGIC: Cranial nerves 2-12 grossly intact; pt opens his eyes to voice and tries to speak PSYCHIATRIC: dementia, no behavioral issues  Patient Active Problem List   Diagnosis Date Noted  . FTT (failure to thrive) in adult 10/19/2015  . UTI (urinary tract infection) 10/19/2015  . DM (diabetes mellitus), type 2, uncontrolled ([place]) 10/15/2015  . Recurrent UTI 10/15/2015  . Bladder cancer ([place]) 10/15/2015  . Conjunctivitis 07/18/2015  . CKD (chronic kidney disease) stage 4, GFR 15-29 ml/min (HCC) 08/10/2014  . Metabolic acidosis [id number]  . Hypernatremia 08/10/2014  . Hyperglycemia 08/03/2014  . HCAP (healthcare-associated pneumonia) 08/03/2014  . Occasional tremors 08/02/2014  . Type 2 diabetes with nephropathy ([place]) 01/02/2014  . Mass of bladder 12/11/2013  . Pain in joint, lower leg 11/12/2013  . DVT (deep venous thrombosis) ([place]) 11/09/2013  . Seizures ([place]) 11/09/2013  . Altered mental status 10/16/2013  . Weakness 10/16/2013  . New onset seizure ([place]) 09/13/2013  . Dizziness 07/19/2013  . Fever presenting with conditions classified elsewhere 04/05/2013  . Cough 04/05/2013  . Type II or unspecified type diabetes mellitus with renal manifestations, uncontrolled 04/01/2013  . Thrush 03/01/2013  . Anemia, iron deficiency 09/22/2012  . Benign renovascular hypertension 07/26/2012  . Diabetes mellitus type 2 with complications ([place]) 0000000  . GERD  (gastroesophageal reflux disease) 07/04/2012  . Chronic anticoagulation 07/04/2012  . Anxiety state, unspecified 07/04/2012  . Essential hypertension, benign 07/04/2012  . Type II or unspecified type diabetes mellitus without mention of complication, not stated as uncontrolled 07/04/2012  . Esophageal reflux 07/04/2012  . History of pulmonary embolism 07/04/2012  . Long term current use of anticoagulant therapy 05/09/2012  . HTN (hypertension) 01/21/2012  . DM type 2 (diabetes mellitus, type 2) ([place]) 01/21/2012  . Urinary retention 01/21/2012  . Encephalopathy 01/09/2012  . Normal pressure hydrocephalus 01/09/2012  . Acute respiratory failure ([place]) 01/04/2012  . PE (pulmonary thromboembolism) ([place]) 01/04/2012  . Cardiac arrest ([place]) 01/04/2012  . Acute renal failure ([place]) 01/04/2012    CBC    Component Value Date/Time   WBC  4.0 08/28/2015   WBC 5.1 01/21/2012 0720   RBC 3.72 (L) 01/21/2012 0720   HGB 11.9 (A) 08/28/2015   HCT 37 (A) 08/28/2015   PLT 159 08/28/2015   MCV 81.5 01/21/2012 0720    CMP     Component Value Date/Time   NA 141 09/03/2015   K 4.5 09/03/2015   CL 108 01/21/2012 0720   CO2 18 (L) 01/21/2012 0720   GLUCOSE 185 (H) 01/21/2012 0720   BUN 36 (A) 09/03/2015   CREATININE 2.4 (A) 09/03/2015   CREATININE 4.16 (H) 01/21/2012 0720   CALCIUM 8.5 01/21/2012 0720   PROT 7.7 01/15/2012 0440   ALBUMIN 2.1 (L) 01/21/2012 0720   AST 22 07/16/2015   ALT 20 07/16/2015   ALKPHOS 145 (A) 07/16/2015   BILITOT 0.4 01/15/2012 0440   GFRNONAA 13 (L) 01/21/2012 0720   GFRAA 15 (L) 01/21/2012 0720    Assessment and Plan  FTT/BLADDER CA- today I combed through pt's chart for more specific info about his bladder CA. He was seen in [facility], so no EPIC records. Per info found pt underwent transurethral resection of bladder mass and had palliative tx with XRT. It was not felt that he was a candidate for surgery or chemotherapy. This was > 1 year ago and was as I  remembered. Pt has increased work of breathing today so he was started on 2L [place] O2 as a comfort measure. I called RP again, no answer and left an urgent message.  ELECTROLYTE ABNORMALITIES- Pt is on Na bicarb for acidosis but his Na+ is 150, his CO2 23 so I'm d/c med as long as he is on IVF. Pt's K+ is 6.4, specimen was hemolyzed, which ws not told to me over the phone he was able to get 15 gm kayaxalte down and Stat BMP was sent because I think K+ is falsly high.  CHRONIC KIDNEY DISEASE STAGE 4- pt's BUN/cr is 54/2.4, the cr is baseline, this was yesterday befor IVF, it should be better today  [place]- the DON was finally able to get in touch with Pt's RP, Rocky Morel at about 6p and she wants him to be a FULL code. I called Ms Ronnald Ramp  And we spoke for a while. She admitted she had had time to think and she agrees to DNR. She was also fine with him not going to hospital and comfort care. I assured her we would cont treatment for UTI and should he make it through this we would treat treatable problems and she understood. I called DON and made her aware of these changes and morphine was ordered for resp distress or anxiety.   Time spent > 35 min;> 50% of time with patient was spent reviewing records, labs, tests and studies, counseling and developing plan of care  Noah Delaine. Sheppard Coil, MD

## 2015-10-19 ENCOUNTER — Encounter: Payer: Self-pay | Admitting: Internal Medicine

## 2015-10-19 DIAGNOSIS — E878 Other disorders of electrolyte and fluid balance, not elsewhere classified: Secondary | ICD-10-CM | POA: Insufficient documentation

## 2015-10-19 DIAGNOSIS — R627 Adult failure to thrive: Secondary | ICD-10-CM | POA: Insufficient documentation

## 2015-10-19 DIAGNOSIS — N390 Urinary tract infection, site not specified: Secondary | ICD-10-CM | POA: Insufficient documentation

## 2015-10-19 DIAGNOSIS — Z710 Person encountering health services to consult on behalf of another person: Secondary | ICD-10-CM | POA: Insufficient documentation

## 2015-10-22 ENCOUNTER — Non-Acute Institutional Stay (SKILLED_NURSING_FACILITY): Payer: Medicare Other | Admitting: Internal Medicine

## 2015-10-22 ENCOUNTER — Encounter: Payer: Self-pay | Admitting: Internal Medicine

## 2015-10-22 DIAGNOSIS — R569 Unspecified convulsions: Secondary | ICD-10-CM | POA: Diagnosis not present

## 2015-10-22 DIAGNOSIS — Z7189 Other specified counseling: Secondary | ICD-10-CM | POA: Diagnosis not present

## 2015-10-22 DIAGNOSIS — R627 Adult failure to thrive: Secondary | ICD-10-CM

## 2015-10-22 NOTE — Progress Notes (Signed)
MRN: [id number] Name: Noah Torres  Sex: male Age: 75 y.o. DOB: 05-15-1940  [place] #:  Facility/Room: [facility] / [id number] Level Of Care: SNF Provider: Noah Delaine. Sheppard Coil, MD Emergency Contacts: Extended Emergency Contact Information Primary Emergency Contact: [place],  [place] Home Phone: [phone] Relation: None Secondary Emergency Contact: Jones,[name] Address: [address]          [place], [place] [address] [name] of [place] Phone: [phone] Relation: Other  Code Status: Full Code  Allergies: Metformin and related  Chief Complaint  Patient presents with  . Acute Visit    Acute    HPI: Patient is 75 y.o. male with bladder CA who has been in steep decline since past few days. Today he is being visited by his sister and her husband. Friday night I finally was able to speak to pt's POA and step daughter and pt was made DNF and comfort care.Reported pt has not had any meds or anything to eat or drink in 2 days. As I am speaking with family pt started having L side head and L arm twitching that increased over time. Pt with h/o seizures and of course had not had seizure meds for 2 days. This requires intervention.  Past Medical History:  Diagnosis Date  . Bilateral hearing loss    Wears hearing aids  . Chronic kidney disease   . Diabetes mellitus, type 2 ([place])   . Former smoker   . Hearing loss    uses hearing aids  . Hypercholesterolemia   . Seizures ([place]) 09/13/2013    Past Surgical History:  Procedure Laterality Date  . SPINE SURGERY        Medication List       Accurate as of 10/22/15 12:29 PM. Always use your most recent med list.          amLODipine 2.5 MG tablet Commonly known as:  NORVASC Take 2.5 mg by mouth daily.   carvedilol 12.5 MG tablet Commonly known as:  COREG Take 12.5 mg by mouth 2 (two) times daily with a meal. Hold for pulse less than 60   Cranberry 450 MG Tabs Take 1 tablet by mouth 2 (two) times daily.    ELIQUIS 2.5 MG Tabs tablet Generic drug:  apixaban Take 2.5 mg by mouth 2 (two) times daily.   escitalopram 10 MG tablet Commonly known as:  LEXAPRO Take 10 mg by mouth daily. For depression   ferrous sulfate 325 (65 FE) MG tablet Take 325 mg by mouth 2 (two) times daily with a meal.   HUMALOG KWIKPEN 100 UNIT/ML KiwkPen Generic drug:  insulin lispro Inject 5 units with meals for CBG > 175  sliding scale  Insulin before meals as follows 175-300=5 units, 301-400=8 units, 401-500=10 units, call MD if greater than 500   HUMULIN N KWIKPEN 100 UNIT/ML Kiwkpen Generic drug:  Insulin NPH (Human) ([place]oph[place]) Inject 17 Units into the skin every morning.   levETIRAcetam 250 MG tablet Commonly known as:  KEPPRA Take 250 mg by mouth daily.   levETIRAcetam 750 MG tablet Commonly known as:  KEPPRA Take 750 mg by mouth daily.   MULTIVITAMIN ADULT PO Give 1 tablet by mouth daily   pantoprazole 40 MG tablet Commonly known as:  PROTONIX Take 40 mg by mouth daily.   saccharomyces boulardii 250 MG capsule Commonly known as:  FLORASTOR Take 250 mg by mouth 2 (two) times daily. Stop Date 10/24/15  sodium bicarbonate 650 MG tablet Take 650 mg by mouth 2 (two) times daily.       No orders of the defined types were placed in this encounter.   Immunization History  Administered Date(s) Administered  . Influenza-Unspecified 11/29/2012, 12/15/2013, 11/16/2014  . PPD Test 01/21/2012    Social History  Substance Use Topics  . Smoking status: Former Research scientist (life sciences)  . Smokeless tobacco: Not on file  . Alcohol use No    Review of Systems  UTO 2/2 pt's very somnalent state; per me he is having seizure activity;per ST pt is not safe to swallow anything.    Vitals:   10/22/15 1227  BP: (!) 110/59  Pulse: 74  Resp: 16  Temp: 97.9 F (36.6 C)    Physical Exam  GENERAL APPEARANCE: somnalent; did open his eyes to Miss Ann's voice No acute distress  SKIN: No diaphoresis rash HEENT:  Unremarkable RESPIRATORY: Breathing is even, unlabored. Lung sounds are clear; wearing O2 for comfort   CARDIOVASCULAR: Heart RRR no murmurs, rubs or gallops. No peripheral edema  GASTROINTESTINAL: Abdomen is soft, non-tender, not distended w/ normal bowel sounds.  GENITOURINARY: Bladder non tender, not distended  MUSCULOSKELETAL: No abnormal joints or musculature NEUROLOGIC: baseline CN intact, he did open eyes to familiar voice, is able to squeeze my hand with his left hand; was exhibiting neck and arm jerking which stopped per family while I was out of room to gget ativan but which started again. PSYCHIATRIC: n/a  Patient Active Problem List   Diagnosis Date Noted  . FTT (failure to thrive) in adult 10/19/2015  . UTI (urinary tract infection) 10/19/2015  . Electrolyte abnormality 10/19/2015  . Encounter for family conference without patient present 10/19/2015  . DM (diabetes mellitus), type 2, uncontrolled ([place]) 10/15/2015  . Recurrent UTI 10/15/2015  . Bladder cancer ([place]) 10/15/2015  . Conjunctivitis 07/18/2015  . CKD (chronic kidney disease) stage 4, GFR 15-29 ml/min ([facility]) 08/10/2014  . Metabolic acidosis [id number]  . Hypernatremia 08/10/2014  . Hyperglycemia 08/03/2014  . HCAP (healthcare-associated pneumonia) 08/03/2014  . Occasional tremors 08/02/2014  . Type 2 diabetes with nephropathy ([place]) 01/02/2014  . Mass of bladder 12/11/2013  . Pain in joint, lower leg 11/12/2013  . DVT (deep venous thrombosis) ([place]) 11/09/2013  . Seizures ([place]) 11/09/2013  . Altered mental status 10/16/2013  . Weakness 10/16/2013  . New onset seizure ([place]) 09/13/2013  . Dizziness 07/19/2013  . Fever presenting with conditions classified elsewhere 04/05/2013  . Cough 04/05/2013  . Type II or unspecified type diabetes mellitus with renal manifestations, uncontrolled 04/01/2013  . Thrush 03/01/2013  . Anemia, iron deficiency 09/22/2012  . Benign renovascular hypertension 07/26/2012  . Diabetes  mellitus type 2 with complications ([place]) 0000000  . GERD (gastroesophageal reflux disease) 07/04/2012  . Chronic anticoagulation 07/04/2012  . Anxiety state, unspecified 07/04/2012  . Essential hypertension, benign 07/04/2012  . Type II or unspecified type diabetes mellitus without mention of complication, not stated as uncontrolled 07/04/2012  . Esophageal reflux 07/04/2012  . History of pulmonary embolism 07/04/2012  . Long term current use of anticoagulant therapy 05/09/2012  . HTN (hypertension) 01/21/2012  . DM type 2 (diabetes mellitus, type 2) ([place]) 01/21/2012  . Urinary retention 01/21/2012  . Encephalopathy 01/09/2012  . Normal pressure hydrocephalus 01/09/2012  . Acute respiratory failure ([place]) 01/04/2012  . PE (pulmonary thromboembolism) ([place]) 01/04/2012  . Cardiac arrest ([place]) 01/04/2012  . Acute renal failure ([facility]) 01/04/2012    CBC    Component Value  Date/Time   WBC 4.0 08/28/2015   WBC 5.1 01/21/2012 0720   RBC 3.72 (L) 01/21/2012 0720   HGB 11.9 (A) 08/28/2015   HCT 37 (A) 08/28/2015   PLT 159 08/28/2015   MCV 81.5 01/21/2012 0720    CMP     Component Value Date/Time   NA 141 09/03/2015   K 4.5 09/03/2015   CL 108 01/21/2012 0720   CO2 18 (L) 01/21/2012 0720   GLUCOSE 185 (H) 01/21/2012 0720   BUN 36 (A) 09/03/2015   CREATININE 2.4 (A) 09/03/2015   CREATININE 4.16 (H) 01/21/2012 0720   CALCIUM 8.5 01/21/2012 0720   PROT 7.7 01/15/2012 0440   ALBUMIN 2.1 (L) 01/21/2012 0720   AST 22 07/16/2015   ALT 20 07/16/2015   ALKPHOS 145 (A) 07/16/2015   BILITOT 0.4 01/15/2012 0440   GFRNONAA 13 (L) 01/21/2012 0720   GFRAA 15 (L) 01/21/2012 0720    Assessment and Plan  FTT - felt 2/2 bladder CA; pt is now DNR and comfort care; he has morphine liquid and will soon be having ativan  SEIZURES - can't give his usual keppra; right now we are giving him ativan 0.5 mg IV; I have ordered ativan gel 1 mg TID as seizure prophylaxis; will titrate if  needed  [place]- pt's sister had many questions, they were all answered; as she left she thanked me and told me she was satisfied he was getting good care   Time spent >35 min Webb Silversmith D. Sheppard Coil, MD

## 2015-11-17 DEATH — deceased
